# Patient Record
Sex: Female | Born: 1985
Health system: Southern US, Community
[De-identification: ages and names within clinical notes are randomized; demographics above are authoritative.]

## PROBLEM LIST (undated history)

## (undated) DIAGNOSIS — F909 Attention-deficit hyperactivity disorder, unspecified type: Secondary | ICD-10-CM

## (undated) DIAGNOSIS — J45909 Unspecified asthma, uncomplicated: Secondary | ICD-10-CM

## (undated) DIAGNOSIS — N6452 Nipple discharge: Secondary | ICD-10-CM

## (undated) DIAGNOSIS — F988 Other specified behavioral and emotional disorders with onset usually occurring in childhood and adolescence: Secondary | ICD-10-CM

## (undated) DIAGNOSIS — E559 Vitamin D deficiency, unspecified: Secondary | ICD-10-CM

## (undated) HISTORY — DX: Attention-deficit hyperactivity disorder, unspecified type: F90.9

## (undated) HISTORY — PX: CERVICAL BIOPSY  W/ LOOP ELECTRODE EXCISION: SUR135

## (undated) HISTORY — DX: Vitamin D deficiency, unspecified: E55.9

---
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
## 2008-01-07 ENCOUNTER — Ambulatory Visit: Payer: Self-pay | Admitting: Internal Medicine

## 2008-03-25 ENCOUNTER — Ambulatory Visit: Payer: Self-pay

## 2012-11-10 ENCOUNTER — Ambulatory Visit: Payer: Self-pay

## 2012-11-15 ENCOUNTER — Ambulatory Visit (INDEPENDENT_AMBULATORY_CARE_PROVIDER_SITE_OTHER): Payer: PRIVATE HEALTH INSURANCE | Admitting: General Surgery

## 2012-11-15 ENCOUNTER — Encounter: Payer: Self-pay | Admitting: General Surgery

## 2012-11-15 VITALS — BP 120/72 | HR 76 | Resp 14 | Ht 61.0 in | Wt 114.0 lb

## 2012-11-15 DIAGNOSIS — N63 Unspecified lump in unspecified breast: Secondary | ICD-10-CM

## 2012-11-15 DIAGNOSIS — N6452 Nipple discharge: Secondary | ICD-10-CM

## 2012-11-15 DIAGNOSIS — N6459 Other signs and symptoms in breast: Secondary | ICD-10-CM

## 2012-11-15 HISTORY — DX: Nipple discharge: N64.52

## 2012-11-15 NOTE — Patient Instructions (Addendum)
Return in 3-4 months for recheck. Call if any worsening of symptoms or discharge from the breast.

## 2012-11-15 NOTE — Progress Notes (Signed)
Patient ID: Mary Kramer, female   DOB: [birth year], 27 y.o.   MRN: [id number]  Chief Complaint  Patient presents with  . Other    left breast lump with d/c    HPI Mary Kramer is a 27 y.o. female here today for an evaluation of a eft breast lump. Had a physical in May 2014 where the lump was first noticed by the practitioner. Patient noticed a yellowish discharge from Left breast about 2 weeks ago. The discharge occurs with expression of the breast. No reports of breast pain or tenderness. Prolactin level recently done was normal,  TSH done in May was also normal.  HPI  History reviewed. No pertinent past medical history.  History reviewed. No pertinent past surgical history.  Family History  Problem Relation Age of Onset  . Leukemia Maternal Grandmother     Social History History  Substance Use Topics  . Smoking status: Never Smoker   . Smokeless tobacco: Never Used  . Alcohol Use: No    Allergies  Allergen Reactions  . Hydrocodone Rash    Current Outpatient Prescriptions  Medication Sig Dispense Refill  . amphetamine-dextroamphetamine (ADDERALL XR) 15 MG 24 hr capsule Take 15 mg by mouth 2 (two) times daily.      . clonazePAM (KLONOPIN) 1 MG tablet Take 1 tablet by mouth as needed.       No current facility-administered medications for this visit.    Review of Systems Review of Systems  Constitutional: Negative.   Respiratory: Negative.   Cardiovascular: Negative.     Blood pressure 120/72, pulse 76, resp. rate 14, height 5\' 1"  (1.549 m), weight 114 lb (51.71 kg), last menstrual period 11/11/2012.  Physical Exam Physical Exam  Constitutional: She is oriented to person, place, and time. She appears well-developed and well-nourished.  Eyes: Conjunctivae are normal. No scleral icterus.  Neck: Neck supple.  Pulmonary/Chest: Right breast exhibits no inverted nipple, no mass, no nipple discharge, no skin change and no tenderness. Left breast exhibits no inverted  nipple, no mass, no nipple discharge, no skin change and no tenderness.  Lymphadenopathy:    She has no cervical adenopathy.    She has no axillary adenopathy.  Neurological: She is alert and oriented to person, place, and time.  Both breast are noted to be very firm consistent with her age. This is particulary noted in Left breast in the 11-12o'clock location A small firm 1 cm thickening is noted 7:30 o'clock near medial edge of breast.  Data Reviewed Ultrasound done here. No findings  Assessment    No findings of concern   breast mass, nipple discharge  Plan    Recheck in 3-4 months        SANKAR,SEEPLAPUTHUR G 11/15/2012, 9:08 PM

## 2013-03-17 ENCOUNTER — Ambulatory Visit: Payer: PRIVATE HEALTH INSURANCE | Admitting: General Surgery

## 2013-04-14 ENCOUNTER — Ambulatory Visit (INDEPENDENT_AMBULATORY_CARE_PROVIDER_SITE_OTHER): Payer: PRIVATE HEALTH INSURANCE | Admitting: General Surgery

## 2013-04-14 ENCOUNTER — Encounter: Payer: Self-pay | Admitting: General Surgery

## 2013-04-14 ENCOUNTER — Other Ambulatory Visit: Payer: PRIVATE HEALTH INSURANCE

## 2013-04-14 VITALS — BP 96/62 | HR 82 | Resp 12 | Ht 61.0 in | Wt 116.0 lb

## 2013-04-14 DIAGNOSIS — N6019 Diffuse cystic mastopathy of unspecified breast: Secondary | ICD-10-CM

## 2013-04-14 DIAGNOSIS — N6452 Nipple discharge: Secondary | ICD-10-CM

## 2013-04-14 DIAGNOSIS — N63 Unspecified lump in unspecified breast: Secondary | ICD-10-CM

## 2013-04-14 DIAGNOSIS — N6459 Other signs and symptoms in breast: Secondary | ICD-10-CM

## 2013-04-14 NOTE — Patient Instructions (Signed)
Continue self breast exams. Call office for any new breast issues or concerns. 

## 2013-04-14 NOTE — Progress Notes (Signed)
Patient ID: Mary Kramer, female   DOB: 30-Apr-1986, 27 y.o.   MRN: [id number]  Chief Complaint  Patient presents with  . Follow-up    HPI Mary Kramer is a 27 y.o. female.  who presents for her follow up breast evaluation. She had palpable thickening in left breast at 7:30 and 10-11 o'clock locations.  Ultrasound was normal. Patient does perform regular self breast checks. No new breast issues.   HPI  History reviewed. No pertinent past medical history.  Past Surgical History  Procedure Laterality Date  . Leep      Family History  Problem Relation Age of Onset  . Leukemia Maternal Grandmother     Social History History  Substance Use Topics  . Smoking status: Never Smoker   . Smokeless tobacco: Never Used  . Alcohol Use: No    Allergies  Allergen Reactions  . Hydrocodone Rash    Current Outpatient Prescriptions  Medication Sig Dispense Refill  . amphetamine-dextroamphetamine (ADDERALL XR) 15 MG 24 hr capsule Take 15 mg by mouth 2 (two) times daily.      . clonazePAM (KLONOPIN) 1 MG tablet Take 1 tablet by mouth as needed.       No current facility-administered medications for this visit.    Review of Systems Review of Systems  Constitutional: Negative.   Respiratory: Negative.   Cardiovascular: Negative.     Blood pressure 96/62, pulse 82, resp. rate 12, height 5\' 1"  (1.549 m), weight 116 lb (52.617 kg), last menstrual period 03/24/2013.  Physical Exam Physical Exam  Constitutional: She is oriented to person, place, and time. She appears well-developed and well-nourished.  Eyes: Conjunctivae are normal. No scleral icterus.  Neck: Neck supple.  Pulmonary/Chest: Right breast exhibits no inverted nipple, no mass, no nipple discharge, no skin change and no tenderness. Left breast exhibits no inverted nipple, no mass, no nipple discharge, no skin change and no tenderness.  thickening at 10-11 o'clock and a 1 cm thickening at 7: 30 that is unchanged from prior  exam.  Lymphadenopathy:    She has no cervical adenopathy.    She has no axillary adenopathy.  Neurological: She is alert and oriented to person, place, and time.  Skin: Skin is warm and dry.    Data Reviewed US done today at 7.30 and 10-11 ocl left showed no findings.  Assessment    Stable physical exam. She has in the left breast thickening at 10-11 o'clock and a 1 cm thickening at 7: 30 that is unchanged from prior exam.     Plan    Follow up in 8 months with office visit.       Kaleen Rochette G 04/15/2013, 5:51 PM

## 2013-04-15 ENCOUNTER — Encounter: Payer: Self-pay | Admitting: General Surgery

## 2013-12-12 ENCOUNTER — Ambulatory Visit: Payer: Self-pay | Admitting: General Surgery

## 2013-12-28 ENCOUNTER — Telehealth: Payer: Self-pay

## 2013-12-28 NOTE — Telephone Encounter (Signed)
Spoke with patient about follow up breast care. I let her know per Neys[facility]rist[facility] that she no longer qualified for the program. She said that she now has insurance and that she would like to follow up with her GYN doctor from now on instead of following up here. I let her know to call us should she have any further problems.

## 2014-03-27 ENCOUNTER — Encounter: Payer: Self-pay | Admitting: General Surgery

## 2014-07-28 LAB — HM PAP SMEAR

## 2014-12-01 ENCOUNTER — Telehealth: Payer: Self-pay | Admitting: Family Medicine

## 2014-12-01 MED ORDER — AMPHETAMINE-DEXTROAMPHETAMINE 20 MG PO TABS: ORAL_TABLET | ORAL | Status: DC

## 2014-12-01 MED ORDER — AMPHETAMINE-DEXTROAMPHET ER 25 MG PO CP24: 25.0000 mg | ORAL_CAPSULE | Freq: Every day | ORAL | Status: DC

## 2014-12-01 NOTE — Telephone Encounter (Signed)
Pt here to pick up her Adderall meds, which were due June 24th.  Pt has been on vacation so is out of meds.

## 2014-12-01 NOTE — Telephone Encounter (Signed)
Doses need to be corrected; CMA took care of this Needs appt for 12 week f/u

## 2014-12-19 ENCOUNTER — Telehealth: Payer: Self-pay | Admitting: Family Medicine

## 2014-12-19 NOTE — Telephone Encounter (Signed)
Patient's last appointment with me was actually August 25, 2014 (she came in to see Mary Kramer for a BP check, but that doesn't count as a visit with the provider) She will need to be seen for any additional prescriptions of her controlled substances If taking her medicines every day, I anticipate she will run out on August 7th but I believe she skips some days, so she can come in the week of August 8th if we don't have anything this week

## 2015-01-08 ENCOUNTER — Other Ambulatory Visit: Payer: Self-pay | Admitting: Family Medicine

## 2015-01-08 MED ORDER — CLONAZEPAM 1 MG PO TABS: 1.0000 mg | ORAL_TABLET | ORAL | Status: DC | PRN

## 2015-01-08 MED ORDER — AMPHETAMINE-DEXTROAMPHET ER 25 MG PO CP24: 25.0000 mg | ORAL_CAPSULE | Freq: Every day | ORAL | Status: DC

## 2015-01-08 MED ORDER — AMPHETAMINE-DEXTROAMPHETAMINE 20 MG PO TABS: ORAL_TABLET | ORAL | Status: DC

## 2015-02-03 ENCOUNTER — Encounter: Payer: Self-pay | Admitting: Emergency Medicine

## 2015-02-03 ENCOUNTER — Emergency Department
Admission: EM | Admit: 2015-02-03 | Discharge: 2015-02-04 | Disposition: A | Discharge status: Home / Self Care | Payer: 59 | Attending: Student | Admitting: Student

## 2015-02-03 DIAGNOSIS — F10129 Alcohol abuse with intoxication, unspecified: Secondary | ICD-10-CM | POA: Diagnosis present

## 2015-02-03 DIAGNOSIS — R112 Nausea with vomiting, unspecified: Secondary | ICD-10-CM

## 2015-02-03 DIAGNOSIS — F10929 Alcohol use, unspecified with intoxication, unspecified: Secondary | ICD-10-CM

## 2015-02-03 DIAGNOSIS — F10120 Alcohol abuse with intoxication, uncomplicated: Secondary | ICD-10-CM | POA: Insufficient documentation

## 2015-02-03 DIAGNOSIS — Z79899 Other long term (current) drug therapy: Secondary | ICD-10-CM | POA: Insufficient documentation

## 2015-02-03 DIAGNOSIS — F10920 Alcohol use, unspecified with intoxication, uncomplicated: Secondary | ICD-10-CM

## 2015-02-03 LAB — CBC WITH DIFFERENTIAL/PLATELET
BASOS ABS: 0.1 10*3/uL (ref 0–0.1)
Basophils Relative: 1 %
EOS ABS: 0.1 10*3/uL (ref 0–0.7)
EOS PCT: 1 %
HCT: 41.5 % (ref 35.0–47.0)
Hemoglobin: 13.4 g/dL (ref 12.0–16.0)
LYMPHS PCT: 29 %
Lymphs Abs: 4.6 10*3/uL — ABNORMAL HIGH (ref 1.0–3.6)
MCH: 28.7 pg (ref 26.0–34.0)
MCHC: 32.2 g/dL (ref 32.0–36.0)
MCV: 89.1 fL (ref 80.0–100.0)
MONO ABS: 1.7 10*3/uL — AB (ref 0.2–0.9)
Monocytes Relative: 11 %
Neutro Abs: 9.1 10*3/uL — ABNORMAL HIGH (ref 1.4–6.5)
Neutrophils Relative %: 58 %
PLATELETS: 327 10*3/uL (ref 150–440)
RBC: 4.66 MIL/uL (ref 3.80–5.20)
RDW: 13.2 % (ref 11.5–14.5)
WBC: 15.5 10*3/uL — AB (ref 3.6–11.0)

## 2015-02-03 LAB — COMPREHENSIVE METABOLIC PANEL
ALBUMIN: 4.4 g/dL (ref 3.5–5.0)
ALK PHOS: 41 U/L (ref 38–126)
ALT: 17 U/L (ref 14–54)
AST: 25 U/L (ref 15–41)
Anion gap: 10 (ref 5–15)
BUN: 12 mg/dL (ref 6–20)
CALCIUM: 8.9 mg/dL (ref 8.9–10.3)
CO2: 22 mmol/L (ref 22–32)
CREATININE: 0.75 mg/dL (ref 0.44–1.00)
Chloride: 106 mmol/L (ref 101–111)
GFR calc Af Amer: >60 mL/min (ref >60)
GFR calc non Af Amer: >60 mL/min (ref >60)
GLUCOSE: 120 mg/dL — AB (ref 65–99)
Potassium: 3 mmol/L — ABNORMAL LOW (ref 3.5–5.1)
SODIUM: 138 mmol/L (ref 135–145)
Total Bilirubin: 0.5 mg/dL (ref 0.3–1.2)
Total Protein: 7.6 g/dL (ref 6.5–8.1)

## 2015-02-03 LAB — ETHANOL: Alcohol, Ethyl (B): 229 mg/dL — ABNORMAL HIGH (ref <5)

## 2015-02-03 MED ORDER — ONDANSETRON HCL 4 MG/2ML IJ SOLN
INTRAMUSCULAR | Status: AC
  Administered 2015-02-03: 4 mg via INTRAVENOUS
  Filled 2015-02-03: qty 2

## 2015-02-03 MED ORDER — ONDANSETRON HCL 4 MG/2ML IJ SOLN
4.0000 mg | Freq: Once | INTRAMUSCULAR | Status: AC
  Administered 2015-02-03: 4 mg via INTRAVENOUS

## 2015-02-03 MED ORDER — SODIUM CHLORIDE 0.9 % IV BOLUS (SEPSIS)
1000.0000 mL | Freq: Once | INTRAVENOUS | Status: AC
Start: 2015-02-03 — End: 2015-02-04
  Administered 2015-02-04: 1000 mL via INTRAVENOUS

## 2015-02-03 MED ORDER — SODIUM CHLORIDE 0.9 % IV BOLUS (SEPSIS)
1000.0000 mL | Freq: Once | INTRAVENOUS | Status: AC
  Administered 2015-02-03: 1000 mL via INTRAVENOUS

## 2015-02-03 MED ORDER — SODIUM CHLORIDE 0.9 % IV SOLN
Freq: Once | INTRAVENOUS | Status: AC
  Administered 2015-02-03: 22:00:00 via INTRAVENOUS

## 2015-02-03 NOTE — ED Provider Notes (Signed)
[facility] Emergency Department Provider Note  ____________________________________________  Time seen: Seen upon arrival to the treatment room.  I have reviewed the triage vital signs and the nursing notes.   HISTORY  Chief Complaint Alcohol Intoxication    HPI Mary Kramer is a 29 y.o. female with a history of ADHD who is presenting today for alcohol intoxication. The patient is unable to give a complete history because of her intoxicated state. However, her husband says that they were at a gathering after a funeral and the patient was drinking alcohol. It is unclear how much alcohol she had but the husband says that he feels that the patient has had at least several "shots."  He says that she usually does not drink and that is why he thinks that she has particularly affected by alcohol tonight. Prior to arrival the patient vomited once and then vomited just before I entered the room. There was no blood in the vomit. The patient denies any pain. There was no trauma prior to arrival.   No past medical history on file.  Patient Active Problem List   Diagnosis Date Noted  . Lump or mass in breast 11/15/2012  . Discharge of breast 11/15/2012    Past Surgical History  Procedure Laterality Date  . Leep      Current Outpatient Rx  Name  Route  Sig  Dispense  Refill  . amphetamine-dextroamphetamine (ADDERALL XR) 25 MG 24 hr capsule   Oral   Take 1 capsule by mouth daily.   30 capsule   0   . amphetamine-dextroamphetamine (ADDERALL) 20 MG tablet      Take 1 early afternoon and take 1 late afternoon; separate pills by at last 4 hours   60 tablet   0   . clonazePAM (KLONOPIN) 1 MG tablet   Oral   Take 1 tablet (1 mg total) by mouth as needed.   30 tablet   0     Allergies Hydrocodone  Family History  Problem Relation Age of Onset  . Leukemia Maternal Grandmother     Social History Social History  Substance Use Topics  . Smoking  status: Never Smoker   . Smokeless tobacco: Never Used  . Alcohol Use: Yes    Review of Systems Constitutional: No fever/chills Eyes: No visual changes. ENT: No sore throat. Cardiovascular: Denies chest pain. Respiratory: Denies shortness of breath. Gastrointestinal: No abdominal pain.  No nausea, no vomiting.  No diarrhea.  No constipation. Genitourinary: Negative for dysuria. Musculoskeletal: Negative for back pain. Skin: Negative for rash. Neurological: Negative for headaches, focal weakness or numbness.  10-point ROS otherwise negative.  ____________________________________________   PHYSICAL EXAM:  VITAL SIGNS: ED Triage Vitals  Enc Vitals Group     BP 02/03/15 2201 113/97 mmHg     Pulse Rate 02/03/15 2201 120     Resp 02/03/15 2201 12     Temp --      Temp Source 02/03/15 2203 Axillary     SpO2 02/03/15 2201 100 %     Weight 02/03/15 2201 120 lb (54.432 kg)     Height 02/03/15 2201 5' (1.524 m)     Head Cir --      Peak Flow --      Pain Score --      Pain Loc --      Pain Edu? --      Excl. in GC? --     Constitutional: Patient is somnolent but arousable.  Once woken the patient is a GCS of 14 secondary to confusion most likely from her intoxication. She has vomitus on her clothing. Eyes: Conjunctivae are normal. PERRL. EOMI. Head: Atraumatic. Nose: No congestion/rhinnorhea. Mouth/Throat: Mucous membranes are moist.  Oropharynx non-erythematous. Neck: No stridor.   Cardiovascular: Normal rate, regular rhythm. Grossly normal heart sounds.  Good peripheral circulation. Respiratory: Normal respiratory effort.  No retractions. Lungs CTAB. Gastrointestinal: Soft and nontender. No distention. No abdominal bruits. No CVA tenderness. Musculoskeletal: No lower extremity tenderness nor edema.  No joint effusions. Neurologic:  Slurring of her speech. No gross focal neurologic deficits are appreciated. Skin:  Skin is warm, dry and intact. No rash  noted.   ____________________________________________   LABS (all labs ordered are listed, but only abnormal results are displayed)  Labs Reviewed  CBC WITH DIFFERENTIAL/PLATELET - Abnormal; Notable for the following:    WBC 15.5 (*)    Neutro Abs 9.1 (*)    Lymphs Abs 4.6 (*)    Monocytes Absolute 1.7 (*)    All other components within normal limits  COMPREHENSIVE METABOLIC PANEL  ETHANOL   ____________________________________________  EKG   ____________________________________________  RADIOLOGY   ____________________________________________   PROCEDURES    ____________________________________________   INITIAL IMPRESSION / ASSESSMENT AND PLAN / ED COURSE  Pertinent labs & imaging results that were available during my care of the patient were reviewed by me and considered in my medical decision making (see chart for details).  ----------------------------------------- 11:52 PM on 02/03/2015 -----------------------------------------  Patient being redosed Zofran. No further vomiting however is complaining of nausea. Will need more time to sober. Signed out to Dr. Inocencio Homes. ____________________________________________   FINAL CLINICAL IMPRESSION(S) / ED DIAGNOSES  Acute alcohol intoxication. Initial visit.    Myrna Blazer, MD 02/03/15 [phone]

## 2015-02-03 NOTE — ED Notes (Signed)
Patient and husband were at a party for a friend. Per patient's husband, patient had a few drinks and she does not usually drink. Husband states they were on their way home and patient began to throw up. Husband could not get patient to reply after vomiting so he came here immediately.

## 2015-02-03 NOTE — ED Notes (Addendum)
Pt with large amount of emesis. md to bedside.  h ead of bed at 60 degrees, aide at bedside assisting pt with emesis bag.

## 2015-02-03 NOTE — ED Notes (Signed)
Husband had to go outside will return shortly Mellody Dance) [phone]

## 2015-02-03 NOTE — ED Notes (Addendum)
Pt's spouse states pt has been consuming etoh this pm. Pt arousable to sternal rub then immediately unable to hold head up. Pt with large amount of emesis while this rn assisting pt out of vehicle. Skin normal color warm and dry.

## 2015-02-04 DIAGNOSIS — F10929 Alcohol use, unspecified with intoxication, unspecified: Secondary | ICD-10-CM

## 2015-02-04 MED ORDER — POTASSIUM CHLORIDE CRYS ER 20 MEQ PO TBCR
20.0000 meq | EXTENDED_RELEASE_TABLET | Freq: Once | ORAL | Status: AC
  Administered 2015-02-04: 20 meq via ORAL
  Filled 2015-02-04: qty 1

## 2015-02-04 NOTE — ED Provider Notes (Addendum)
-----------------------------------------   2:52 AM on 02/04/2015 -----------------------------------------  Care was assumed from Dr. Langston Masker at 11:52 PM. Briefly, this is a healthy 29 year old female who presented with alcohol intoxication and vomiting. Currently she appears clearly sober, she is ambulatory, sitting up in bed, talkative in no acute distress, tolerating by mouth intake. Blood pressure at the time of discharge is 91/58. She has received IV fluids however I suspect that this is her usual blood pressure as she is a young, healthy, thin person.  Her significant other will take her home. DC with return precautions. Labs are notable for mild hypokalemia, we'll replete potassium.   Gayla Doss, MD 02/04/15 1324  Gayla Doss, MD 02/04/15 [phone]

## 2015-02-04 NOTE — ED Notes (Signed)
Pt. Going home with husband. 

## 2015-02-05 ENCOUNTER — Ambulatory Visit: Payer: Self-pay | Admitting: Family Medicine

## 2015-02-09 ENCOUNTER — Encounter: Payer: Self-pay | Admitting: Family Medicine

## 2015-02-09 ENCOUNTER — Ambulatory Visit (INDEPENDENT_AMBULATORY_CARE_PROVIDER_SITE_OTHER): Payer: Managed Care, Other (non HMO) | Admitting: Family Medicine

## 2015-02-09 VITALS — BP 127/85 | HR 87 | Temp 98.7°F | Ht 61.1 in | Wt 115.4 lb

## 2015-02-09 DIAGNOSIS — F909 Attention-deficit hyperactivity disorder, unspecified type: Secondary | ICD-10-CM | POA: Insufficient documentation

## 2015-02-09 DIAGNOSIS — F10120 Alcohol abuse with intoxication, uncomplicated: Secondary | ICD-10-CM | POA: Diagnosis not present

## 2015-02-09 DIAGNOSIS — F419 Anxiety disorder, unspecified: Secondary | ICD-10-CM | POA: Diagnosis not present

## 2015-02-09 DIAGNOSIS — E162 Hypoglycemia, unspecified: Secondary | ICD-10-CM | POA: Diagnosis not present

## 2015-02-09 DIAGNOSIS — F10920 Alcohol use, unspecified with intoxication, uncomplicated: Secondary | ICD-10-CM

## 2015-02-09 MED ORDER — AMPHETAMINE-DEXTROAMPHETAMINE 20 MG PO TABS: ORAL_TABLET | ORAL | Status: DC

## 2015-02-09 MED ORDER — AMPHETAMINE-DEXTROAMPHET ER 25 MG PO CP24: 25.0000 mg | ORAL_CAPSULE | Freq: Every day | ORAL | Status: DC

## 2015-02-09 NOTE — Progress Notes (Signed)
BP 127/85 mmHg  Pulse 87  Temp(Src) 98.7 F (37.1 C)  Ht 5' 1.1" (1.552 m)  Wt 115 lb 6.4 oz (52.345 kg)  BMI 21.73 kg/m2  SpO2 98%  LMP 02/03/2015   Subjective:    Patient ID: Mary Kramer, female    DOB: Feb 16, 1986, 29 y.o.   MRN: [id number]  HPI: Mary Kramer is a 29 y.o. female  Chief Complaint  Patient presents with  . ADHD    pt states she needs refills on Adderal's   She was in the ER for alcohol intoxication She had five shots of liquor within a two hour period; felt sick and spinning; tried to make herself vomit and couldn't; husband helped her in the truck and then doesn't remember much more; does not usually drink, special social occasion; hit her hard because she just doesn't drink usually; might have two beers or a glass of wine at a social event; husband took her to the hospital; vomited in the truck on the way and she has no recall; not awake throwing up, not like a drunk sick thing; the last dose of Klonopin was two days prior to the alcohol The next day, she didn't really have a hangover, just very weak and tired and nauseaous; pins and needles in her stomach from vomiting; home on Monday from work; better on Tuesday; by Thursday, she was better but tired and weak Her alcohol level was 229  She has ADHD; doing well on that medicine; no headaches, no loss of appetite, or feeling jittery; the medicine helps her to concentrate, focus on one thing rather than working on 3-4 things and not getting anything done; has taken medicine for a while; no energy drinks; nonsmoker; no use of cocaine  Sugar has gone up and down for a while; everything has been fine for a while; a little worse since Saturday; feels bad after eating sugary stuff  Relevant past medical, surgical, family and social history reviewed and updated as indicated. Interim medical history since our last visit reviewed. Allergies and medications reviewed and updated.  Review of Systems  Per  HPI unless specifically indicated above     Objective:    BP 127/85 mmHg  Pulse 87  Temp(Src) 98.7 F (37.1 C)  Ht 5' 1.1" (1.552 m)  Wt 115 lb 6.4 oz (52.345 kg)  BMI 21.73 kg/m2  SpO2 98%  LMP 02/03/2015  Wt Readings from Last 3 Encounters:  02/09/15 115 lb 6.4 oz (52.345 kg)  10/19/14 121 lb (54.885 kg)  02/03/15 120 lb (54.432 kg)    Physical Exam  Constitutional: She appears well-developed and well-nourished. No distress.  Eyes: EOM are normal. No scleral icterus.  Neck: No thyromegaly present.  Cardiovascular: Normal rate and regular rhythm.   Pulmonary/Chest: Effort normal and breath sounds normal.  Abdominal: She exhibits no distension.  Skin: No pallor.  Psychiatric: She has a normal mood and affect. Her behavior is normal. Judgment and thought content normal.   Results for orders placed or performed in visit on 02/09/15  HM PAP SMEAR  Result Value Ref Range   HM Pap smear from PP       Assessment & Plan:   Problem List Items Addressed This Visit      Endocrine   Hypoglycemia    She describes what sound like episodes of reactive hypoglyemia, or hypoglycemia due to insufficient intake; advised her to carry a pack crackers with peanut butter or other similar snack; will check  glucose tolerance test      Relevant Orders   Glucose tolerance, 3 hours     Other   Alcohol intoxication    Single episode after period of very little alcohol; significant quantity in a short amount of time; so glad that her husband took her to the ER; discussed the seriousness of that episode; do NOT take any benzos any more; discussed safe limits of alcohol, that women in general cannot tolerate as much alcohol as men and have lower threshold limits; discussed respiratory depression, risk of unintentional overdose, toxicity      Acute anxiety    While I do not have a problem prescribing stimulants for her with her ADHD, I do have a problem with prescribing benzodiazepines for her; I  explained that I will not prescribe benzos any longer, and explained the risk of combination of benzos with alcohol, which could result in unintentional fatal overdose      ADHD (attention deficit hyperactivity disorder) - Primary    I have no concern about the use of ADHD medicines in this women who suffered an episode of alcohol intoxication; if anything, some women will self-medicate with alcohol to treat underlying ADHD, but this does not seem to be the case for her; refills provided of her ADHD stimulant; return in 3 months for follow-up, refills         Follow up plan: Return in about 3 months (around 05/11/2015).  An after-visit summary was printed and given to the patient at check-out.  Please see the patient instructions which may contain other information and recommendations beyond what is mentioned above in the assessment and plan. Meds ordered this encounter  Medications  . DISCONTD: amphetamine-dextroamphetamine (ADDERALL) 20 MG tablet    Sig: Take 1 early afternoon and take 1 late afternoon; separate pills by at last 4 hours    Dispense:  60 tablet    Refill:  0  . DISCONTD: amphetamine-dextroamphetamine (ADDERALL XR) 25 MG 24 hr capsule    Sig: Take 1 capsule by mouth daily.    Dispense:  30 capsule    Refill:  0  . DISCONTD: amphetamine-dextroamphetamine (ADDERALL) 20 MG tablet    Sig: Take 1 early afternoon and take 1 late afternoon; separate pills by at last 4 hours    Dispense:  60 tablet    Refill:  0  . DISCONTD: amphetamine-dextroamphetamine (ADDERALL XR) 25 MG 24 hr capsule    Sig: Take 1 capsule by mouth daily.    Dispense:  30 capsule    Refill:  0  . amphetamine-dextroamphetamine (ADDERALL XR) 25 MG 24 hr capsule    Sig: Take 1 capsule by mouth daily.    Dispense:  30 capsule    Refill:  0    Fill on or after March 11, 2015  . amphetamine-dextroamphetamine (ADDERALL) 20 MG tablet    Sig: Take 1 early afternoon and take 1 late afternoon; separate pills  by at last 4 hours    Dispense:  60 tablet    Refill:  0    Fill on or after March 11, 2015   Medications Discontinued During This Encounter  Medication Reason  . clonazePAM (KLONOPIN) 1 MG tablet Discontinued by provider  . amphetamine-dextroamphetamine (ADDERALL) 20 MG tablet Reorder  . amphetamine-dextroamphetamine (ADDERALL XR) 25 MG 24 hr capsule Reorder  . amphetamine-dextroamphetamine (ADDERALL) 20 MG tablet Reorder  . amphetamine-dextroamphetamine (ADDERALL XR) 25 MG 24 hr capsule Reorder  . amphetamine-dextroamphetamine (ADDERALL XR) 25 MG 24 hr  capsule Reorder  . amphetamine-dextroamphetamine (ADDERALL) 20 MG tablet Reorder

## 2015-02-09 NOTE — Patient Instructions (Addendum)
Please call if you have not heard back about the glucose tolerance test by Wednesday Return in 3 months for next ADHD check and medicine refills Stay smart Carry crackers or something similar to use if you need calories Hypoglycemia Hypoglycemia occurs when the glucose in your blood is too low. Glucose is a type of sugar that is your body's main energy source. Hormones, such as insulin and glucagon, control the level of glucose in the blood. Insulin lowers blood glucose and glucagon increases blood glucose. Having too much insulin in your blood stream, or not eating enough food containing sugar, can result in hypoglycemia. Hypoglycemia can happen to people with or without diabetes. It can develop quickly and can be a medical emergency.  CAUSES   Missing or delaying meals.  Not eating enough carbohydrates at meals.  Taking too much diabetes medicine.  Not timing your oral diabetes medicine or insulin doses with meals, snacks, and exercise.  Nausea and vomiting.  Certain medicines.  Severe illnesses, such as hepatitis, kidney disorders, and certain eating disorders.  Increased activity or exercise without eating something extra or adjusting medicines.  Drinking too much alcohol.  A nerve disorder that affects body functions like your heart rate, blood pressure, and digestion (autonomic neuropathy).  A condition where the stomach muscles do not function properly (gastroparesis). Therefore, medicines and food may not absorb properly.  Rarely, a tumor of the pancreas can produce too much insulin. SYMPTOMS   Hunger.  Sweating (diaphoresis).  Change in body temperature.  Shakiness.  Headache.  Anxiety.  Lightheadedness.  Irritability.  Difficulty concentrating.  Dry mouth.  Tingling or numbness in the hands or feet.  Restless sleep or sleep disturbances.  Altered speech and coordination.  Change in mental status.  Seizures or prolonged  convulsions.  Combativeness.  Drowsiness (lethargic).  Weakness.  Increased heart rate or palpitations.  Confusion.  Pale, gray skin color.  Blurred or double vision.  Fainting. DIAGNOSIS  A physical exam and medical history will be performed. Your caregiver may make a diagnosis based on your symptoms. Blood tests and other lab tests may be performed to confirm a diagnosis. Once the diagnosis is made, your caregiver will see if your signs and symptoms go away once your blood glucose is raised.  TREATMENT  Usually, you can easily treat your hypoglycemia when you notice symptoms.  Check your blood glucose. If it is less than 70 mg/dl, take one of the following:   3-4 glucose tablets.    cup juice.    cup regular soda.   1 cup skim milk.   -1 tube of glucose gel.   5-6 hard candies.   Avoid high-fat drinks or food that may delay a rise in blood glucose levels.  Do not take more than the recommended amount of sugary foods, drinks, gel, or tablets. Doing so will cause your blood glucose to go too high.   Wait 10-15 minutes and recheck your blood glucose. If it is still less than 70 mg/dl or below your target range, repeat treatment.   Eat a snack if it is more than 1 hour until your next meal.  There may be a time when your blood glucose may go so low that you are unable to treat yourself at home when you start to notice symptoms. You may need someone to help you. You may even faint or be unable to swallow. If you cannot treat yourself, someone will need to bring you to the hospital.  HOME CARE  INSTRUCTIONS  If you have diabetes, follow your diabetes management plan by:  Taking your medicines as directed.  Following your exercise plan.  Following your meal plan. Do not skip meals. Eat on time.  Testing your blood glucose regularly. Check your blood glucose before and after exercise. If you exercise longer or different than usual, be sure to check blood  glucose more frequently.  Wearing your medical alert jewelry that says you have diabetes.  Identify the cause of your hypoglycemia. Then, develop ways to prevent the recurrence of hypoglycemia.  Do not take a hot bath or shower right after an insulin shot.  Always carry treatment with you. Glucose tablets are the easiest to carry.  If you are going to drink alcohol, drink it only with meals.  Tell friends or family members ways to keep you safe during a seizure. This may include removing hard or sharp objects from the area or turning you on your side.  Maintain a healthy weight. SEEK MEDICAL CARE IF:   You are having problems keeping your blood glucose in your target range.  You are having frequent episodes of hypoglycemia.  You feel you might be having side effects from your medicines.  You are not sure why your blood glucose is dropping so low.  You notice a change in vision or a new problem with your vision. SEEK IMMEDIATE MEDICAL CARE IF:   Confusion develops.  A change in mental status occurs.  The inability to swallow develops.  Fainting occurs. Document Released: 05/12/2005 Document Revised: 05/17/2013 Document Reviewed: 09/08/2011 [facility] Patient Information 2015 [place], [place]. This information is not intended to replace advice given to you by your health care provider. Make sure you discuss any questions you have with your health care provider.

## 2015-02-11 NOTE — Assessment & Plan Note (Signed)
She describes what sound like episodes of reactive hypoglyemia, or hypoglycemia due to insufficient intake; advised her to carry a pack crackers with peanut butter or other similar snack; will check glucose tolerance test

## 2015-02-11 NOTE — Assessment & Plan Note (Signed)
Single episode after period of very little alcohol; significant quantity in a short amount of time; so glad that her husband took her to the ER; discussed the seriousness of that episode; do NOT take any benzos any more; discussed safe limits of alcohol, that women in general cannot tolerate as much alcohol as men and have lower threshold limits; discussed respiratory depression, risk of unintentional overdose, toxicity

## 2015-02-11 NOTE — Assessment & Plan Note (Signed)
While I do not have a problem prescribing stimulants for her with her ADHD, I do have a problem with prescribing benzodiazepines for her; I explained that I will not prescribe benzos any longer, and explained the risk of combination of benzos with alcohol, which could result in unintentional fatal overdose

## 2015-02-11 NOTE — Assessment & Plan Note (Signed)
I have no concern about the use of ADHD medicines in this women who suffered an episode of alcohol intoxication; if anything, some women will self-medicate with alcohol to treat underlying ADHD, but this does not seem to be the case for her; refills provided of her ADHD stimulant; return in 3 months for follow-up, refills

## 2015-05-04 ENCOUNTER — Telehealth: Payer: Self-pay | Admitting: Family Medicine

## 2015-05-04 NOTE — Telephone Encounter (Signed)
No answer, no voicemail.

## 2015-05-04 NOTE — Telephone Encounter (Signed)
Patient is coming back for 3 month f/u soon We still don't have her 3 hour glucose tolerance test I'd really like that done before her appointment to go over with her Thanks

## 2015-05-07 NOTE — Telephone Encounter (Signed)
No answer, no voicemail.

## 2015-05-08 NOTE — Telephone Encounter (Signed)
No answer, no voicemail.

## 2015-05-10 NOTE — Telephone Encounter (Signed)
No answer, no voicemail.

## 2015-05-11 ENCOUNTER — Encounter: Payer: Self-pay | Admitting: Family Medicine

## 2015-05-11 ENCOUNTER — Ambulatory Visit (INDEPENDENT_AMBULATORY_CARE_PROVIDER_SITE_OTHER): Payer: Managed Care, Other (non HMO) | Admitting: Family Medicine

## 2015-05-11 VITALS — BP 112/76 | HR 96 | Temp 98.0°F | Wt 119.0 lb

## 2015-05-11 DIAGNOSIS — F909 Attention-deficit hyperactivity disorder, unspecified type: Secondary | ICD-10-CM | POA: Diagnosis not present

## 2015-05-11 DIAGNOSIS — G729 Myopathy, unspecified: Secondary | ICD-10-CM

## 2015-05-11 DIAGNOSIS — G43009 Migraine without aura, not intractable, without status migrainosus: Secondary | ICD-10-CM | POA: Diagnosis not present

## 2015-05-11 DIAGNOSIS — M6289 Other specified disorders of muscle: Secondary | ICD-10-CM

## 2015-05-11 MED ORDER — AMPHETAMINE-DEXTROAMPHET ER 25 MG PO CP24: 25.0000 mg | ORAL_CAPSULE | Freq: Every day | ORAL | Status: DC

## 2015-05-11 MED ORDER — AMPHETAMINE-DEXTROAMPHETAMINE 20 MG PO TABS: ORAL_TABLET | ORAL | Status: DC

## 2015-05-11 NOTE — Patient Instructions (Addendum)
Please do let us know if we need your next prescription to be a different medicine Try the stretches Return in 3 months

## 2015-05-11 NOTE — Progress Notes (Signed)
BP 112/76 mmHg  Pulse 96  Temp(Src) 98 F (36.7 C)  Wt 119 lb (53.978 kg)  SpO2 99%  LMP 04/26/2015 (Approximate)   Subjective:    Patient ID: Mary Kramer, female    DOB: 05-19-1986, 29 y.o.   MRN: [id number]  HPI: Mary Kramer is a 29 y.o. female  Chief Complaint  Patient presents with  . ADHD    follow up, med refills   She says that the insurance isn't going to cover her medicine any more, $100 or so last month She will call and see what else is covered She has been taking 25 mg XR in the morning 8:30 am; lasts about 6 hours and then takes the short-acting dose between 2;30 and 3:00 pm Having some headaches on the XR, maybe 3 times a week; migraines, has had them before; migraines are withOUT aura Triggered by caffeine, certain wipes at work No loss of appetite; no tremors or shaking Medicine is helping her to concentrate; does not feel like a zombie Mood is good  She just hasn't had the sugar test done; keeping herself from getting too hungry; willing to cancel it right now  She gets tense in the upper shoulders; she doesn't want to take medicine; her legs are sore a lot; sore along the   Relevant past medical, surgical, family and social history reviewed and updated as indicated. Interim medical history since our last visit reviewed.  Allergies and medications reviewed and updated.  Review of Systems Per HPI unless specifically indicated above     Objective:    BP 112/76 mmHg  Pulse 96  Temp(Src) 98 F (36.7 C)  Wt 119 lb (53.978 kg)  SpO2 99%  LMP 04/26/2015 (Approximate)  Wt Readings from Last 3 Encounters:  05/11/15 119 lb (53.978 kg)  02/09/15 115 lb 6.4 oz (52.345 kg)  10/19/14 121 lb (54.885 kg)    Physical Exam  Constitutional: She appears well-developed and well-nourished. No distress.  Cardiovascular: Normal rate and regular rhythm.   Pulmonary/Chest: Effort normal and breath sounds normal.  Musculoskeletal:       Right elbow:  No tenderness found.       Left elbow: No tenderness found.  Mild tightness of both trapezius muscles proximally; no tenderness under the clavicles  Neurological: She is alert. She displays no tremor.  Skin: No rash noted.  Psychiatric: She has a normal mood and affect. Her mood appears not anxious. Her speech is not rapid and/or pressured. She is not agitated and not hyperactive. She does not exhibit a depressed mood.   Results for orders placed or performed in visit on 02/09/15  HM PAP SMEAR  Result Value Ref Range   HM Pap smear from PP       Assessment & Plan:   Problem List Items Addressed This Visit      Cardiovascular and Mediastinum   Migraine without aura - Primary (Chronic)    Satisfied with current treatment; avoid / limit triggers        Other   ADHD (attention deficit hyperactivity disorder) (Chronic)    Doing well with current therapy, but unfortunately there has been a change in her insurance coverage apparently; we discussed options; she will call her insurance company and find out what medicine(s) will be covered that will be less expensive for her; I would prefer to keep her on a long-acting to be taken in the morning with breakthrough afternoon med if needed; after next Rx, she will  check pulse and BP and weight and call if any variations; willing to see her back in 3 months if med working well, sooner if adjustments are needed       Other Visit Diagnoses    Muscle tightness        I don't think this is fibromyalgia; negative pressure points under clavicle and elbows; try gentle stretching, check work ergonomics       Follow up plan: Return in about 3 months (around 08/09/2015).  Meds ordered this encounter  Medications  . amphetamine-dextroamphetamine (ADDERALL) 20 MG tablet    Sig: Take 1 early afternoon and take 1 late afternoon; separate pills by at last 4 hours    Dispense:  180 tablet    Refill:  0  . amphetamine-dextroamphetamine (ADDERALL XR) 25 MG  24 hr capsule    Sig: Take 1 capsule by mouth daily.    Dispense:  90 capsule    Refill:  0   An after-visit summary was printed and given to the patient at check-out.  Please see the patient instructions which may contain other information and recommendations beyond what is mentioned above in the assessment and plan.

## 2015-05-11 NOTE — Assessment & Plan Note (Signed)
Satisfied with current treatment; avoid / limit triggers

## 2015-05-11 NOTE — Telephone Encounter (Signed)
Patient came in for appt. She has decided she does not want to do the test.

## 2015-05-12 NOTE — Assessment & Plan Note (Signed)
Doing well with current therapy, but unfortunately there has been a change in her insurance coverage apparently; we discussed options; she will call her insurance company and find out what medicine(s) will be covered that will be less expensive for her; I would prefer to keep her on a long-acting to be taken in the morning with breakthrough afternoon med if needed; after next Rx, she will check pulse and BP and weight and call if any variations; willing to see her back in 3 months if med working well, sooner if adjustments are needed

## 2015-05-23 ENCOUNTER — Ambulatory Visit
Admission: EM | Admit: 2015-05-23 | Discharge: 2015-05-23 | Disposition: A | Discharge status: Home / Self Care | Payer: Worker's Compensation | Attending: Family Medicine | Admitting: Family Medicine

## 2015-05-23 DIAGNOSIS — T304 Corrosion of unspecified body region, unspecified degree: Secondary | ICD-10-CM

## 2015-05-23 HISTORY — DX: Other specified behavioral and emotional disorders with onset usually occurring in childhood and adolescence: F98.8

## 2015-05-23 MED ORDER — TETANUS-DIPHTH-ACELL PERTUSSIS 5-2.5-18.5 LF-MCG/0.5 IM SUSP
0.5000 mL | Freq: Once | INTRAMUSCULAR | Status: AC
  Administered 2015-05-23: 0.5 mL via INTRAMUSCULAR

## 2015-05-23 NOTE — ED Notes (Signed)
Work in a [employer] and using Liquid Nitrogen and canister exploded while holding onto it. Burn to right palm and thumb

## 2015-05-23 NOTE — ED Provider Notes (Signed)
CSN: [id number]     Arrival date & time 05/23/15  [id number] History   First MD Initiated Contact with Patient 05/23/15 1030     Chief Complaint  Patient presents with  . Hand Burn   (Consider location/radiation/quality/duration/timing/severity/associated sxs/prior Treatment) HPI: Patient presents today with symptoms of tenderness to the right palmar aspect of the hand and thumb. Patient states that she was assisting with wart removal with nitric oxide when the chemical spouted out and hit her right hand. She denies any injury anywhere else. She immediately put Silvadene cream on the area. She denies any blisters of the area.  Past Medical History  Diagnosis Date  . ADHD (attention deficit hyperactivity disorder)   . Vitamin D deficiency   . ADD (attention deficit disorder)    Past Surgical History  Procedure Laterality Date  . Leep    . Leep     Family History  Problem Relation Age of Onset  . Leukemia Maternal Grandmother   . Anxiety disorder Mother   . Migraines Mother   . Hyperlipidemia Father   . Transient ischemic attack Father   . Hypertension Father   . Stroke Father   . Lung disease Father   . Cancer Paternal Grandmother     leukemia  . Diabetes Paternal Grandfather    Social History  Substance Use Topics  . Smoking status: Never Smoker   . Smokeless tobacco: Never Used  . Alcohol Use: Yes     Comment: rarely   OB History    Gravida Para Term Preterm AB TAB SAB Ectopic Multiple Living   1 0   1  0 0  0      Obstetric Comments   1st Menstrual Cycle:  13 1st Pregnancy:  23     Review of Systems: Negative except mentioned above.   Allergies  Lexapro; Hydrocodone; and Vicodin  Home Medications   Prior to Admission medications   Medication Sig Start Date End Date Taking? Authorizing Provider  amphetamine-dextroamphetamine (ADDERALL XR) 25 MG 24 hr capsule Take 1 capsule by mouth daily. 05/11/15  Yes Kerman PasseyMelinda P Lada, MD  amphetamine-dextroamphetamine  (ADDERALL) 20 MG tablet Take 1 early afternoon and take 1 late afternoon; separate pills by at last 4 hours 05/11/15  Yes Kerman PasseyMelinda P Lada, MD  norgestimate-ethinyl estradiol (ORTHO-CYCLEN,SPRINTEC,PREVIFEM) 0.25-35 MG-MCG tablet Take 1 tablet by mouth daily.    Historical Provider, MD   Meds Ordered and Administered this Visit   Medications  Tdap (BOOSTRIX) injection 0.5 mL (not administered)    BP 111/77 mmHg  Pulse 70  Temp(Src) 97.1 F (36.2 C) (Tympanic)  Resp 16  Ht 5\' 1"  (1.549 m)  Wt 119 lb (53.978 kg)  BMI 22.50 kg/m2  SpO2 100%  LMP 05/21/2015 (LMP Unknown) No data found.   Physical Exam   GENERAL: NAD RESP: CTA B CARD: RRR SKIN: R Hand-no blisters noted, no ecchymosis or discoloration of the skin appreciated, patient had been soaking hand in room temperature saline when I came in to examine her, she stated that her thumb and other digits felt normal just mild/moderate discomfort in the palm of the right hand remained, no swelling of the hand appreciated, digits nv intact, FROM of digits/wrist  NEURO: CN II-XII grossly intact   ED Course  Procedures (including critical care time)   MDM   1. Chemical burn    I called poison control to discuss the incident. They suggested that I treat the patient with supportive care. I encouraged patient  to seek medical attention if her symptoms did persist or worsen. She will take Tylenol or Motrin for pain if needed. She should be able to return to work. Forms were filled out regarding this. TDAP was given to patient prior to discharge.    Jolene Provost, MD 05/23/15 [phone]

## 2015-08-10 ENCOUNTER — Encounter: Payer: Self-pay | Admitting: Family Medicine

## 2015-08-10 ENCOUNTER — Ambulatory Visit (INDEPENDENT_AMBULATORY_CARE_PROVIDER_SITE_OTHER): Payer: Managed Care, Other (non HMO) | Admitting: Family Medicine

## 2015-08-10 VITALS — BP 121/78 | HR 94 | Temp 98.7°F | Wt 121.0 lb

## 2015-08-10 DIAGNOSIS — G4700 Insomnia, unspecified: Secondary | ICD-10-CM | POA: Diagnosis not present

## 2015-08-10 DIAGNOSIS — E559 Vitamin D deficiency, unspecified: Secondary | ICD-10-CM | POA: Insufficient documentation

## 2015-08-10 DIAGNOSIS — F909 Attention-deficit hyperactivity disorder, unspecified type: Secondary | ICD-10-CM

## 2015-08-10 MED ORDER — AMPHETAMINE-DEXTROAMPHETAMINE 20 MG PO TABS: ORAL_TABLET | ORAL | Status: DC

## 2015-08-10 MED ORDER — AMPHETAMINE-DEXTROAMPHET ER 25 MG PO CP24: 25.0000 mg | ORAL_CAPSULE | Freq: Every day | ORAL | Status: DC

## 2015-08-10 NOTE — Patient Instructions (Addendum)
Take 2,000 iu of vitamin D3 once a day for about one month, then 1,000 iu daily is usually sufficient Try 3 mg of melatonin at the exact same time of night for 3 weeks to see if that resets your clock Do try to go to bed at the same time of night every night Return in 3 months  Insomnia Insomnia is a sleep disorder that makes it difficult to fall asleep or to stay asleep. Insomnia can cause tiredness (fatigue), low energy, difficulty concentrating, mood swings, and poor performance at work or school.  There are three different ways to classify insomnia:  Difficulty falling asleep.  Difficulty staying asleep.  Waking up too early in the morning. Any type of insomnia can be long-term (chronic) or short-term (acute). Both are common. Short-term insomnia usually lasts for three months or less. Chronic insomnia occurs at least three times a week for longer than three months. CAUSES  Insomnia may be caused by another condition, situation, or substance, such as:  Anxiety.  Certain medicines.  Gastroesophageal reflux disease (GERD) or other gastrointestinal conditions.  Asthma or other breathing conditions.  Restless legs syndrome, sleep apnea, or other sleep disorders.  Chronic pain.  Menopause. This may include hot flashes.  Stroke.  Abuse of alcohol, tobacco, or illegal drugs.  Depression.  Caffeine.   Neurological disorders, such as Alzheimer disease.  An overactive thyroid (hyperthyroidism). The cause of insomnia may not be known. RISK FACTORS Risk factors for insomnia include:  Gender. Women are more commonly affected than men.  Age. Insomnia is more common as you get older.  Stress. This may involve your professional or personal life.  Income. Insomnia is more common in people with lower income.  Lack of exercise.   Irregular work schedule or night shifts.  Traveling between different time zones. SIGNS AND SYMPTOMS If you have insomnia, trouble falling  asleep or trouble staying asleep is the main symptom. This may lead to other symptoms, such as:  Feeling fatigued.  Feeling nervous about going to sleep.  Not feeling rested in the morning.  Having trouble concentrating.  Feeling irritable, anxious, or depressed. TREATMENT  Treatment for insomnia depends on the cause. If your insomnia is caused by an underlying condition, treatment will focus on addressing the condition. Treatment may also include:   Medicines to help you sleep.  Counseling or therapy.  Lifestyle adjustments. HOME CARE INSTRUCTIONS   Take medicines only as directed by your health care provider.  Keep regular sleeping and waking hours. Avoid naps.  Keep a sleep diary to help you and your health care provider figure out what could be causing your insomnia. Include:   When you sleep.  When you wake up during the night.  How well you sleep.   How rested you feel the next day.  Any side effects of medicines you are taking.  What you eat and drink.   Make your bedroom a comfortable place where it is easy to fall asleep:  Put up shades or special blackout curtains to block light from outside.  Use a white noise machine to block noise.  Keep the temperature cool.   Exercise regularly as directed by your health care provider. Avoid exercising right before bedtime.  Use relaxation techniques to manage stress. Ask your health care provider to suggest some techniques that may work well for you. These may include:  Breathing exercises.  Routines to release muscle tension.  Visualizing peaceful scenes.  Cut back on alcohol, caffeinated beverages,  and cigarettes, especially close to bedtime. These can disrupt your sleep.  Do not overeat or eat spicy foods right before bedtime. This can lead to digestive discomfort that can make it hard for you to sleep.  Limit screen use before bedtime. This includes:  Watching TV.  Using your smartphone, tablet,  and computer.  Stick to a routine. This can help you fall asleep faster. Try to do a quiet activity, brush your teeth, and go to bed at the same time each night.  Get out of bed if you are still awake after 15 minutes of trying to sleep. Keep the lights down, but try reading or doing a quiet activity. When you feel sleepy, go back to bed.  Make sure that you drive carefully. Avoid driving if you feel very sleepy.  Keep all follow-up appointments as directed by your health care provider. This is important. SEEK MEDICAL CARE IF:   You are tired throughout the day or have trouble in your daily routine due to sleepiness.  You continue to have sleep problems or your sleep problems get worse. SEEK IMMEDIATE MEDICAL CARE IF:   You have serious thoughts about hurting yourself or someone else.   This information is not intended to replace advice given to you by your health care provider. Make sure you discuss any questions you have with your health care provider.   Document Released: 05/09/2000 Document Revised: 01/31/2015 Document Reviewed: 02/10/2014 [employer] Interactive Patient Education [employer]

## 2015-08-10 NOTE — Assessment & Plan Note (Signed)
2000 iu daily for one month, then 1000 iu daily when not getting adequate sun exposure

## 2015-08-10 NOTE — Progress Notes (Signed)
BP 121/78 mmHg  Pulse 94  Temp(Src) 98.7 F (37.1 C)  Wt 121 lb (54.885 kg)  SpO2 99%  LMP 08/09/2015   Subjective:    Patient ID: Mary Kramer, female    DOB: 1986-01-22, 30 y.o.   MRN: [id number]  HPI: Mary Kramer is a 30 y.o. female  Chief Complaint  Patient presents with  . ADHD    follow up and med refill   She has hx of vitamin D deficiency; she can feel that she's low; felt down last year when it was low; vitamin D was 21 Doesn't sleep well at all; thinks it is the vitamin D; more tired No daytime naps; wakes up 2-3 x at night; no trouble falling asleep Does not feel irritable or depressed No pets interrupting sleep; keeps cell phone away from bedroom; sleeps on her side Showers before bed; no caffeine  ADHD; medicine is helpful for symptoms; no headaches, no heart palpitations, no jitteriness; would like to stay on same dose  Relevant past medical, surgical, history reviewed Interim medical history since our last visit reviewed; no medical excitement Allergies and medications reviewed  Review of Systems Per HPI unless specifically indicated above     Objective:    BP 121/78 mmHg  Pulse 94  Temp(Src) 98.7 F (37.1 C)  Wt 121 lb (54.885 kg)  SpO2 99%  LMP 08/09/2015  Wt Readings from Last 3 Encounters:  08/10/15 121 lb (54.885 kg)  05/23/15 119 lb (53.978 kg)  05/11/15 119 lb (53.978 kg)    Physical Exam  Constitutional: She appears well-developed and well-nourished. No distress.  Cardiovascular: Normal rate and regular rhythm.   No extrasystoles are present.  Pulmonary/Chest: Effort normal and breath sounds normal.  Skin: No pallor.  Psychiatric: She has a normal mood and affect. Her mood appears not anxious. She does not exhibit a depressed mood.    Results for orders placed or performed in visit on 02/09/15  HM PAP SMEAR  Result Value Ref Range   HM Pap smear from PP       Assessment & Plan:   Problem List Items Addressed  This Visit      Other   ADHD (attention deficit hyperactivity disorder) (Chronic)    Continue medicine same dose; no adverse side effects; medicine is helpful for her in controlling symptoms; return for medicine check in 3 months; Rx signed, handed to patient at check-out      Insomnia - Primary    Suggested trying melatonin 3 mg same time of night for 3 weeks; continue good sleep hygiene; see AVS      Vitamin D deficiency    2000 iu daily for one month, then 1000 iu daily when not getting adequate sun exposure         Follow up plan: Return in about 3 months (around 11/10/2015) for medicine follow-up.  Meds ordered this encounter  Medications  . amphetamine-dextroamphetamine (ADDERALL) 20 MG tablet    Sig: Take 1 early afternoon and take 1 late afternoon; separate pills by at last 4 hours    Dispense:  180 tablet    Refill:  0  . amphetamine-dextroamphetamine (ADDERALL XR) 25 MG 24 hr capsule    Sig: Take 1 capsule by mouth daily.    Dispense:  90 capsule    Refill:  0   An after-visit summary was printed and given to the patient at check-out.  Please see the patient instructions which may contain other information  and recommendations beyond what is mentioned above in the assessment and plan.

## 2015-08-10 NOTE — Assessment & Plan Note (Signed)
Continue medicine same dose; no adverse side effects; medicine is helpful for her in controlling symptoms; return for medicine check in 3 months; Rx signed, handed to patient at check-out

## 2015-08-10 NOTE — Assessment & Plan Note (Signed)
Suggested trying melatonin 3 mg same time of night for 3 weeks; continue good sleep hygiene; see AVS

## 2015-08-29 ENCOUNTER — Ambulatory Visit
Admission: EM | Admit: 2015-08-29 | Discharge: 2015-08-29 | Disposition: A | Discharge status: Home / Self Care | Payer: 59 | Attending: Family Medicine | Admitting: Family Medicine

## 2015-08-29 ENCOUNTER — Ambulatory Visit (INDEPENDENT_AMBULATORY_CARE_PROVIDER_SITE_OTHER): Payer: 59

## 2015-08-29 ENCOUNTER — Encounter: Payer: Self-pay | Admitting: Emergency Medicine

## 2015-08-29 DIAGNOSIS — R1013 Epigastric pain: Secondary | ICD-10-CM

## 2015-08-29 DIAGNOSIS — K5900 Constipation, unspecified: Secondary | ICD-10-CM

## 2015-08-29 DIAGNOSIS — R109 Unspecified abdominal pain: Secondary | ICD-10-CM | POA: Diagnosis not present

## 2015-08-29 LAB — CBC WITH DIFFERENTIAL/PLATELET
BASOS ABS: 0.1 10*3/uL (ref 0–0.1)
Basophils Relative: 1 %
Eosinophils Absolute: 0 10*3/uL (ref 0–0.7)
Eosinophils Relative: 1 %
HEMATOCRIT: 41.2 % (ref 35.0–47.0)
HEMOGLOBIN: 13.7 g/dL (ref 12.0–16.0)
LYMPHS PCT: 29 %
Lymphs Abs: 3 10*3/uL (ref 1.0–3.6)
MCH: 29.1 pg (ref 26.0–34.0)
MCHC: 33.2 g/dL (ref 32.0–36.0)
MCV: 87.7 fL (ref 80.0–100.0)
Monocytes Absolute: 0.9 10*3/uL (ref 0.2–0.9)
Monocytes Relative: 9 %
NEUTROS ABS: 6.4 10*3/uL (ref 1.4–6.5)
NEUTROS PCT: 60 %
Platelets: 306 10*3/uL (ref 150–440)
RBC: 4.7 MIL/uL (ref 3.80–5.20)
RDW: 13.2 % (ref 11.5–14.5)
WBC: 10.5 10*3/uL (ref 3.6–11.0)

## 2015-08-29 LAB — COMPREHENSIVE METABOLIC PANEL
ALBUMIN: 4.5 g/dL (ref 3.5–5.0)
ALK PHOS: 41 U/L (ref 38–126)
ALT: 30 U/L (ref 14–54)
AST: 30 U/L (ref 15–41)
Anion gap: 5 (ref 5–15)
BILIRUBIN TOTAL: 0.6 mg/dL (ref 0.3–1.2)
BUN: 9 mg/dL (ref 6–20)
CALCIUM: 8.8 mg/dL — AB (ref 8.9–10.3)
CO2: 24 mmol/L (ref 22–32)
Chloride: 106 mmol/L (ref 101–111)
Creatinine, Ser: 0.62 mg/dL (ref 0.44–1.00)
GFR calc Af Amer: >60 mL/min (ref >60)
GLUCOSE: 94 mg/dL (ref 65–99)
POTASSIUM: 3.8 mmol/L (ref 3.5–5.1)
Sodium: 135 mmol/L (ref 135–145)
TOTAL PROTEIN: 7.7 g/dL (ref 6.5–8.1)

## 2015-08-29 LAB — URINALYSIS COMPLETE WITH MICROSCOPIC (ARMC ONLY)
BACTERIA UA: NONE SEEN
BILIRUBIN URINE: NEGATIVE
GLUCOSE, UA: NEGATIVE mg/dL
Hgb urine dipstick: NEGATIVE
Ketones, ur: NEGATIVE mg/dL
Leukocytes, UA: NEGATIVE
Nitrite: NEGATIVE
Protein, ur: NEGATIVE mg/dL
Specific Gravity, Urine: 1.015 (ref 1.005–1.030)
WBC UA: NONE SEEN WBC/hpf (ref 0–5)
pH: 7 (ref 5.0–8.0)

## 2015-08-29 LAB — AMYLASE: Amylase: 70 U/L (ref 28–100)

## 2015-08-29 LAB — LIPASE, BLOOD: LIPASE: 25 U/L (ref 11–51)

## 2015-08-29 LAB — PREGNANCY, URINE: PREG TEST UR: NEGATIVE

## 2015-08-29 IMAGING — CR DG ABDOMEN ACUTE W/ 1V CHEST
3 series · 3 of 3 positions shown · non-contrast
Comparison: None.

CLINICAL DATA: Acute abdominal pain yesterday, some nausea

EXAM:
DG ABDOMEN ACUTE W/ 1V CHEST

[chest pa]
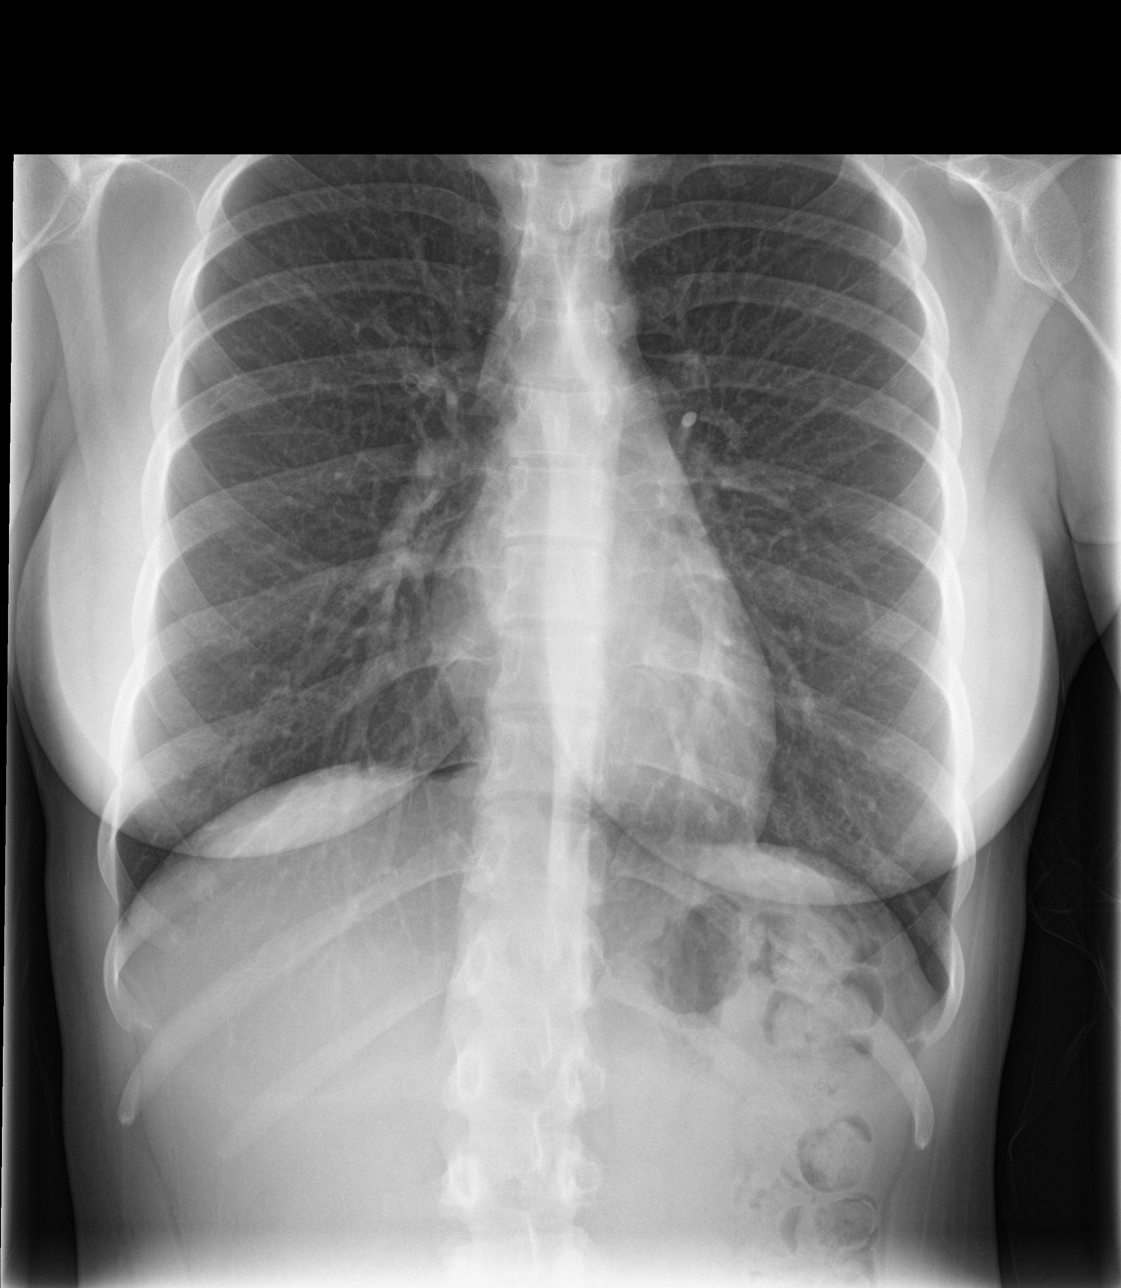

[abdomen erect]
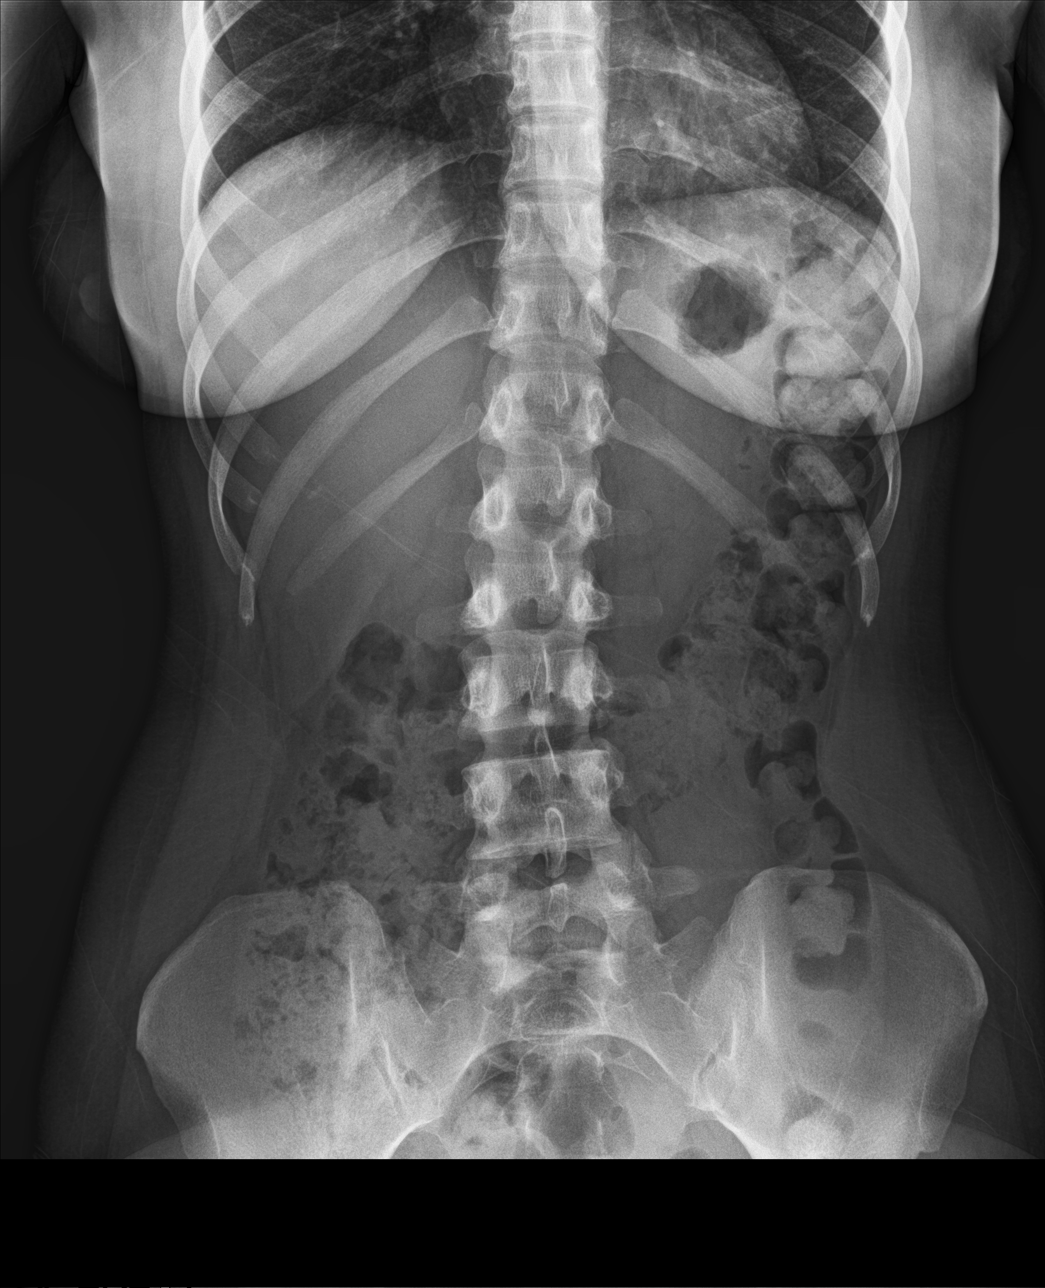

[abdomen supine]
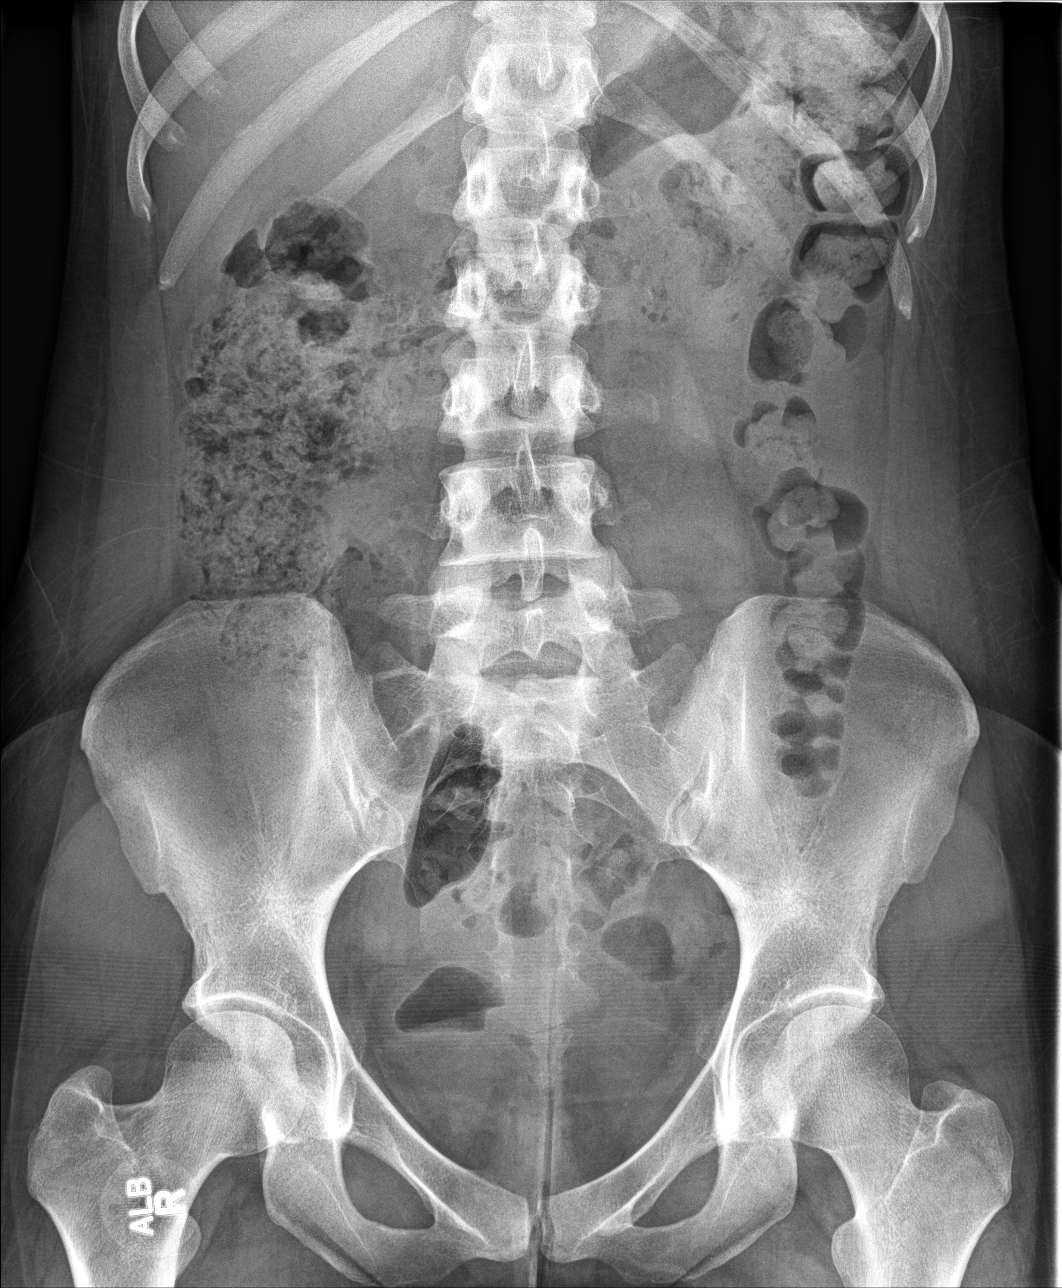

[3 of 3 positions shown; findings below may reference images not displayed]

FINDINGS: No active infiltrate or effusion is seen. Mediastinal and hilar
contours are unremarkable. The heart is within normal limits in
size. No bony abnormality is seen.

Supine and erect views of the abdomen show a moderate amount of
feces throughout the colon. No bowel obstruction is seen. No free
intraperitoneal air is noted. No opaque calculi are evident. No bony
abnormality is noted.
IMPRESSION: 1. No active lung disease.
2. No bowel obstruction.  No free air.
3. Moderate amount of feces throughout the colon.

## 2015-08-29 NOTE — ED Notes (Signed)
Patient c/o upper abdominal pain that started last night. Patient reports nausea.  Patient denies V/D.  Patient denies fevers or chills.

## 2015-08-29 NOTE — ED Provider Notes (Signed)
CSN: [id number]     Arrival date & time 08/29/15  0920 History   First MD Initiated Contact with Patient 08/29/15 1022     Chief Complaint  Patient presents with  . Abdominal Pain   (Consider location/radiation/quality/duration/timing/severity/associated sxs/prior Treatment) HPI: Patient presents today with symptoms of epigastric abdominal pain which started last night. Her pain does radiate some to the left side. Patient has had some nausea but no vomiting or diarrhea. She denies any fever or chills. She denies any upper respiratory symptoms. Her last menstrual period was approximately 3 weeks ago. She denies any history of pancreatitis or gallbladder issues. She denies any alcohol use yesterday or any spicy/greasy food. She denies any chronic GI issues. She denies any trauma to the area.  Past Medical History  Diagnosis Date  . ADHD (attention deficit hyperactivity disorder)   . Vitamin D deficiency   . ADD (attention deficit disorder)    Past Surgical History  Procedure Laterality Date  . Leep    . Leep     Family History  Problem Relation Age of Onset  . Leukemia Maternal Grandmother   . Anxiety disorder Mother   . Migraines Mother   . Hyperlipidemia Father   . Transient ischemic attack Father   . Hypertension Father   . Stroke Father   . Lung disease Father   . Cancer Paternal Grandmother     leukemia  . Diabetes Paternal Grandfather    Social History  Substance Use Topics  . Smoking status: Never Smoker   . Smokeless tobacco: Never Used  . Alcohol Use: Yes     Comment: rarely   OB History    Gravida Para Term Preterm AB TAB SAB Ectopic Multiple Living   1 0   1  0 0  0      Obstetric Comments   1st Menstrual Cycle:  13 1st Pregnancy:  23     Review of Systems: Negative except mentioned above.  Allergies  Lexapro; Hydrocodone; and Vicodin  Home Medications   Prior to Admission medications   Medication Sig Start Date End Date Taking? Authorizing Provider   amphetamine-dextroamphetamine (ADDERALL XR) 25 MG 24 hr capsule Take 1 capsule by mouth daily. 08/10/15   Kerman PasseyMelinda P Lada, MD  amphetamine-dextroamphetamine (ADDERALL) 20 MG tablet Take 1 early afternoon and take 1 late afternoon; separate pills by at last 4 hours 08/10/15   Kerman PasseyMelinda P Lada, MD  norgestimate-ethinyl estradiol (ORTHO-CYCLEN,SPRINTEC,PREVIFEM) 0.25-35 MG-MCG tablet Take 1 tablet by mouth daily.    Historical Provider, MD   Meds Ordered and Administered this Visit  Medications - No data to display  BP 107/72 mmHg  Pulse 64  Temp(Src) 98.7 F (37.1 C) (Tympanic)  Resp 16  Ht 5\' 1"  (1.549 m)  Wt 120 lb (54.432 kg)  BMI 22.69 kg/m2  SpO2 100%  LMP 08/08/2015 (Exact Date) No data found.   Physical Exam   GENERAL: NAD HEENT: no pharyngeal erythema, no exudate, no erythema of TMs, no cervical LAD RESP: CTA B CARD: RRR  ABD: +BS, mild to moderate epigastric tenderness, mild generalized left sided abdominal tenderness, no right sided abdominal tenderness, no flank tenderness   NEURO: CN II-XII grossly intact   ED Course  Procedures (including critical care time)  Labs Review Labs Reviewed  URINALYSIS COMPLETEWITH MICROSCOPIC [facility] ONLY)  PREGNANCY, URINE    Imaging Review No results found.    MDM  Abdominal pain-x-ray indicative of constipation, labs essentially normal, recommend patient try taking mag  citrate for 1 or 2 doses. She can try Pepcid or Zantac if needed. Avoid foods that cause constipation. If her symptoms do persist or worsen I do recommend that she go to the ER for further workup with CT scan as discussed.   Jolene Provost, MD 08/29/15 [phone]

## 2015-09-14 ENCOUNTER — Ambulatory Visit: Payer: Managed Care, Other (non HMO) | Admitting: Family Medicine

## 2015-12-07 ENCOUNTER — Telehealth: Payer: Self-pay | Admitting: Family Medicine

## 2015-12-07 MED ORDER — AMPHETAMINE-DEXTROAMPHET ER 25 MG PO CP24: 25.0000 mg | ORAL_CAPSULE | Freq: Every day | ORAL | Status: DC

## 2015-12-07 MED ORDER — AMPHETAMINE-DEXTROAMPHETAMINE 20 MG PO TABS: ORAL_TABLET | ORAL | Status: DC

## 2015-12-07 NOTE — Telephone Encounter (Signed)
This cannot be sent in; it will have to be picked up [place] website reviewed; no other prescribers; last fill of each was 09/05/15; appropriate; Rx approved

## 2015-12-07 NOTE — Telephone Encounter (Signed)
CALED PT BUT VOICE MAIL WAS FULL. WILL TRY AGAIN ON Monday 17TH

## 2015-12-07 NOTE — Telephone Encounter (Signed)
Pt needs refill on her Adderall ended release and they other Adderall too. If can be sent or called in pharm is [facility] in [place]. She needs 3 month supply due to insurance. Pt pt has CPE NEXT WEEK.

## 2015-12-21 ENCOUNTER — Encounter: Payer: Self-pay | Admitting: Family Medicine

## 2015-12-21 ENCOUNTER — Ambulatory Visit (INDEPENDENT_AMBULATORY_CARE_PROVIDER_SITE_OTHER): Payer: Managed Care, Other (non HMO) | Admitting: Family Medicine

## 2015-12-21 DIAGNOSIS — F909 Attention-deficit hyperactivity disorder, unspecified type: Secondary | ICD-10-CM | POA: Diagnosis not present

## 2015-12-21 MED ORDER — LISDEXAMFETAMINE DIMESYLATE 30 MG PO CAPS: 30.0000 mg | ORAL_CAPSULE | Freq: Every day | ORAL | 0 refills | Status: DC

## 2015-12-21 NOTE — Progress Notes (Signed)
BP 108/72   Pulse 96   Temp 98.2 F (36.8 C) (Oral)   Resp 16   Wt 120 lb (54.4 kg)   LMP 12/20/2015   SpO2 98%   BMI 22.67 kg/m    Subjective:    Patient ID: Mary Kramer, female    DOB: [birth year], 30 y.o.   MRN: [id number]  HPI: Mary Kramer is a 30 y.o. female  Chief Complaint  Patient presents with  . Medication Refill   She is here for f/u of ADHD Never finishes anything, starts lots of projects We reviewed the ADHD scores On the medicine, still having trouble finishing tasks She had the dose increased but it didn't work well, got bad headaches on the higher dose She has been on the adderall XR for a year and a half or two years The XR is the trouble Shorter-acting did better Current treatment just not working well [employer] in the past, did not work Did not try vyvanse or concerta See the ADHD screening questions which were done today by MD, questions answered as if she were OFF of her medicine  Depression screen PHQ 2/9 12/21/2015  Decreased Interest 0  Down, Depressed, Hopeless 0  PHQ - 2 Score 0   Relevant past medical, surgical, family and social history reviewed Past Medical History:  Diagnosis Date  . ADD (attention deficit disorder)   . ADHD (attention deficit hyperactivity disorder)   . Vitamin D deficiency    Family History  Problem Relation Age of Onset  . Leukemia Maternal Grandmother   . Anxiety disorder Mother   . Migraines Mother   . Hyperlipidemia Father   . Transient ischemic attack Father   . Hypertension Father   . Stroke Father   . Lung disease Father   . Cancer Paternal Grandmother     leukemia  . Diabetes Paternal Grandfather    Social History  Substance Use Topics  . Smoking status: Never Smoker  . Smokeless tobacco: Never Used  . Alcohol use Yes     Comment: rarely   Interim medical history since last visit reviewed. Allergies and medications reviewed  Review of Systems Per HPI unless  specifically indicated above     Objective:    BP 108/72   Pulse 96   Temp 98.2 F (36.8 C) (Oral)   Resp 16   Wt 120 lb (54.4 kg)   LMP 12/20/2015   SpO2 98%   BMI 22.67 kg/m   Wt Readings from Last 3 Encounters:  12/21/15 120 lb (54.4 kg)  08/29/15 120 lb (54.4 kg)  08/10/15 121 lb (54.9 kg)    Physical Exam  Constitutional: She appears well-developed and well-nourished.  Eyes: EOM are normal.  Cardiovascular: Normal rate and regular rhythm.   No extrasystoles are present.  Pulmonary/Chest: Effort normal and breath sounds normal.  Neurological: She displays no tremor.  Reflex Scores:      Patellar reflexes are 2+ on the right side and 2+ on the left side. Psychiatric: She has a normal mood and affect. Her behavior is normal. Judgment and thought content normal. Her mood appears not anxious. Her speech is not rapid and/or pressured. Cognition and memory are normal.      Assessment & Plan:   Problem List Items Addressed This Visit      Other   ADHD (attention deficit hyperactivity disorder) (Chronic)    Current treatment does not seem to working as well; discussed switching agents; will try vyvanse;  considered starting at 40 mg daily, but patient was more comfortable starting at 30 mg daily; Rx for 10 days of 30 mg and then patient to monitor her pulse, BP, response to therapy, and weight and call me; if working well without weight loss or rise in pulse or BP, then can continue; if dose adjustment needed, will be glad to give Rx for 40 mg and repeat her monitoring her own pulse, BP, response, and weight another 10 days; will see her back in 4 weeks       Other Visit Diagnoses   None.     Follow up plan: Return in about 4 weeks (around 01/18/2016) for medication changes.  An after-visit summary was printed and given to the patient at check-out.  Please see the patient instructions which may contain other information and recommendations beyond what is mentioned above in  the assessment and plan.  Meds ordered this encounter  Medications  . lisdexamfetamine (VYVANSE) 30 MG capsule    Sig: Take 1 capsule (30 mg total) by mouth daily.    Dispense:  10 capsule    Refill:  0

## 2015-12-21 NOTE — Assessment & Plan Note (Signed)
Current treatment does not seem to working as well; discussed switching agents; will try vyvanse; considered starting at 40 mg daily, but patient was more comfortable starting at 30 mg daily; Rx for 10 days of 30 mg and then patient to monitor her pulse, BP, response to therapy, and weight and call me; if working well without weight loss or rise in pulse or BP, then can continue; if dose adjustment needed, will be glad to give Rx for 40 mg and repeat her monitoring her own pulse, BP, response, and weight another 10 days; will see her back in 4 weeks

## 2015-12-21 NOTE — Patient Instructions (Signed)
OHIO = Only Handle It Once Check out the book Answers to Distraction by [facility] and [place] Stop the Adderall and start the new medicine Call me in 7-10 days with your heart rate and blood pressure and weight and response to medicine

## 2015-12-25 ENCOUNTER — Telehealth: Payer: Self-pay | Admitting: Family Medicine

## 2016-01-01 NOTE — Telephone Encounter (Signed)
Please refer to previous note ins will not cover vyvanse?

## 2016-01-03 MED ORDER — METHYLPHENIDATE HCL ER (OSM) 27 MG PO TBCR: 27.0000 mg | EXTENDED_RELEASE_TABLET | ORAL | 0 refills | Status: DC

## 2016-01-03 NOTE — Telephone Encounter (Signed)
Called patient, mailbox full and cannot accept messages. Script up front if patient calls

## 2016-01-03 NOTE — Telephone Encounter (Signed)
I'm sorry that her insurance won't help with this medicine, even with the copay card We'll start Concerta Rx ready Return for f/u in 3-4 weeks, call sooner with any issues

## 2016-01-03 NOTE — Telephone Encounter (Signed)
Could not reach patient °

## 2016-01-11 ENCOUNTER — Telehealth: Payer: Self-pay | Admitting: Family Medicine

## 2016-01-11 NOTE — Telephone Encounter (Signed)
Patient wanted to switch her medication to the generic form of Adderall XR 30 mg.  She stated that she feels more comfortable taking the Adderall.  Patient was informed that provider was on vacation and wouldn't respond until Monday or Tuesday of next week.

## 2016-01-18 ENCOUNTER — Ambulatory Visit: Payer: Managed Care, Other (non HMO) | Admitting: Family Medicine

## 2016-01-18 NOTE — Telephone Encounter (Signed)
Patient was supposed to come in today, but did not keep appointment I have not prescribed her the 30 mg XR and we don't just increase doses in between appointments; she was 25 mg XR previously My last note says she did not like the XR Adderall, so I'm confused I had hoped to discuss all this with her at appointment but she did not come in Please suggest that she reschedule If she would prefer to see a psychiatrist instead, just let me know and we'll make that referral

## 2016-01-21 NOTE — Telephone Encounter (Signed)
Tried calling pt and mail box is full and unable to take messages

## 2016-01-23 MED ORDER — LISDEXAMFETAMINE DIMESYLATE 40 MG PO CAPS: 40.0000 mg | ORAL_CAPSULE | ORAL | 0 refills | Status: DC

## 2016-01-23 NOTE — Telephone Encounter (Signed)
I talked with patient I apologized profusely for delay in getting back to her, but wanted to read through her chart, because I didn't think the plan was to use Adderall XR 30 We talked and decided to use Vyvanse 40 mg daily She'll check BP, pulse, weight 1-2x a week and call me in 2 weeks Pay attention to efficacy and we can adjust dose in 2 weeks if needed and tolerated

## 2016-03-04 ENCOUNTER — Telehealth: Payer: Self-pay | Admitting: Family Medicine

## 2016-03-04 ENCOUNTER — Other Ambulatory Visit: Payer: Self-pay | Admitting: Family Medicine

## 2016-03-04 NOTE — Telephone Encounter (Signed)
Pt states she would like to change from Vyvance to Adderell. Pt states this has been discussed during one of her visits.Pt is out of her meds. Pt does not have an upcoming appt.

## 2016-03-06 MED ORDER — AMPHETAMINE-DEXTROAMPHETAMINE 20 MG PO TABS: 20.0000 mg | ORAL_TABLET | Freq: Every day | ORAL | 0 refills | Status: DC

## 2016-03-06 MED ORDER — AMPHETAMINE-DEXTROAMPHET ER 25 MG PO CP24: 25.0000 mg | ORAL_CAPSULE | ORAL | 0 refills | Status: DC

## 2016-03-06 NOTE — Telephone Encounter (Signed)
I talked with patient about her symptoms; the Vyvanse made her more irritable; we'll go back to 25 mg XR adderall in the AM and short-acting 20 mg in the mid afternoon; if higher doses needed, will refer her to psych to get right dose/strength, and then she can come back to me once best regimen figured out; she agrees; Rxs ready to pick up Friday; appt in 4 weeks Discussed risk of heart attack, stroke, BP issues with patient; her heart rates were high 90s; explained why I don't want to give her 30 mg XR Adderall; she understands

## 2016-04-04 ENCOUNTER — Other Ambulatory Visit: Payer: Self-pay | Admitting: Family Medicine

## 2016-04-07 ENCOUNTER — Other Ambulatory Visit: Payer: Self-pay

## 2016-04-07 ENCOUNTER — Telehealth: Payer: Self-pay | Admitting: Family Medicine

## 2016-04-07 NOTE — Telephone Encounter (Signed)
Patient has to be seen for these Her last appt was more than 3 months ago

## 2016-04-07 NOTE — Telephone Encounter (Signed)
See patient note; she says pharmacy has sent prior auth x 2; please help her with this; thank you

## 2016-04-07 NOTE — Telephone Encounter (Signed)
I have not seen the prior auth from this patient's pharmacy so I contacted them [facility]) and asked them to resend the fax so I could get started on it.

## 2016-04-08 ENCOUNTER — Encounter: Payer: Self-pay | Admitting: Family Medicine

## 2016-04-08 ENCOUNTER — Ambulatory Visit (INDEPENDENT_AMBULATORY_CARE_PROVIDER_SITE_OTHER): Payer: 59 | Admitting: Family Medicine

## 2016-04-08 DIAGNOSIS — F909 Attention-deficit hyperactivity disorder, unspecified type: Secondary | ICD-10-CM | POA: Diagnosis not present

## 2016-04-08 MED ORDER — LISDEXAMFETAMINE DIMESYLATE 50 MG PO CAPS: 50.0000 mg | ORAL_CAPSULE | Freq: Every day | ORAL | 0 refills | Status: DC

## 2016-04-08 NOTE — Telephone Encounter (Signed)
Patient was seen today; she just opted to change the medicine She was actually waiting for prior auth for the short-acting adderall, not the XR, apparently We have switched medicine now, so no prior auth needed any more Thank you

## 2016-04-08 NOTE — Telephone Encounter (Signed)
I just got the prior auth for this patient's Adderall XR 25mg  from [facility].

## 2016-04-08 NOTE — Progress Notes (Signed)
BP 116/68   Pulse 92   Temp 98.2 F (36.8 C)   Wt 119 lb 8 oz (54.2 kg)   LMP 03/28/2016 (Approximate)   SpO2 98%   BMI 22.58 kg/m    Subjective:    Patient ID: Mary Kramer, female    DOB: April 06, 1986, 30 y.o.   MRN: [id number]  HPI: Mary Kramer is a 30 y.o. female  Chief Complaint  Patient presents with  . Medication Refill    adderall and adderall xr   She is on the XR adderall; the short-acting afternoon dose never got approved She liked the vyvanse but it made her irritated towards the end of the day; she would like to try higher dose On the medicine, no chest pains or palpitations She checked her BP and pulse on the Vyvanse No tics She says the medicine helps her stay focused; she will do five different things at work The medicine completes task  [facility] 2/9 04/08/2016 12/21/2015  Decreased Interest 0 0  Down, Depressed, Hopeless 0 0  PHQ - 2 Score 0 0   Relevant past medical, surgical, family and social history reviewed Past Medical History:  Diagnosis Date  . ADD (attention deficit disorder)   . ADHD (attention deficit hyperactivity disorder)   . Vitamin D deficiency    Family History  Problem Relation Age of Onset  . Leukemia Maternal Grandmother   . Anxiety disorder Mother   . Migraines Mother   . Hyperlipidemia Father   . Transient ischemic attack Father   . Hypertension Father   . Stroke Father   . Lung disease Father   . Cancer Paternal Grandmother     leukemia  . Diabetes Paternal Grandfather    Social History  Substance Use Topics  . Smoking status: Never Smoker  . Smokeless tobacco: Never Used  . Alcohol use Yes     Comment: rarely   Interim medical history since last visit reviewed. Allergies and medications reviewed  Review of Systems Per HPI unless specifically indicated above     Objective:    BP 116/68   Pulse 92   Temp 98.2 F (36.8 C)   Wt 119 lb 8 oz (54.2 kg)   LMP 03/28/2016  (Approximate)   SpO2 98%   BMI 22.58 kg/m   Wt Readings from Last 3 Encounters:  04/08/16 119 lb 8 oz (54.2 kg)  12/21/15 120 lb (54.4 kg)  08/29/15 120 lb (54.4 kg)    Physical Exam  Constitutional: She appears well-developed and well-nourished.  Cardiovascular: Normal rate and regular rhythm.   Pulmonary/Chest: Effort normal and breath sounds normal.  Neurological: She displays no tremor.  Reflex Scores:      Patellar reflexes are 2+ on the right side and 2+ on the left side. No tics  Psychiatric: Her mood appears not anxious. She does not exhibit a depressed mood.      Assessment & Plan:   Problem List Items Addressed This Visit      Other   ADHD (attention deficit hyperactivity disorder) (Chronic)    Patient wants to try Vyvanse again, as it worked well; she wishes to try the 50 mg strength; do not take Adderall XR and the Vyvanse together         Follow up plan: Return in about 3 months (around 07/09/2016).  An after-visit summary was printed and given to the patient at check-out.  Please see the patient instructions which may contain other information  and recommendations beyond what is mentioned above in the assessment and plan.  Meds ordered this encounter  Medications  . lisdexamfetamine (VYVANSE) 50 MG capsule    Sig: Take 1 capsule (50 mg total) by mouth daily.    Dispense:  30 capsule    Refill:  0    Stopping the Adderall completely

## 2016-04-08 NOTE — Patient Instructions (Signed)
Stop the Adderall XR completely Start the new dose of Vyvanse Check BP and pulse a few times a week and let me know your readings and your response If doing well, we'll see you back in 3 months

## 2016-04-08 NOTE — Assessment & Plan Note (Signed)
Patient wants to try Vyvanse again, as it worked well; she wishes to try the 50 mg strength; do not take Adderall XR and the Vyvanse together

## 2016-05-21 ENCOUNTER — Other Ambulatory Visit: Payer: Self-pay | Admitting: Family Medicine

## 2016-05-21 MED ORDER — LISDEXAMFETAMINE DIMESYLATE 50 MG PO CAPS: 50.0000 mg | ORAL_CAPSULE | Freq: Every day | ORAL | 0 refills | Status: DC

## 2016-05-21 NOTE — Telephone Encounter (Signed)
Reviewed last office note Reviewed [place] web site Rxs approved Note to patient, okay to come pick them up

## 2016-07-01 ENCOUNTER — Other Ambulatory Visit: Payer: Self-pay | Admitting: Family Medicine

## 2016-08-22 ENCOUNTER — Encounter: Payer: Self-pay | Admitting: Family Medicine

## 2016-08-22 ENCOUNTER — Ambulatory Visit (INDEPENDENT_AMBULATORY_CARE_PROVIDER_SITE_OTHER): Payer: 59 | Admitting: Family Medicine

## 2016-08-22 DIAGNOSIS — F909 Attention-deficit hyperactivity disorder, unspecified type: Secondary | ICD-10-CM

## 2016-08-22 MED ORDER — LISDEXAMFETAMINE DIMESYLATE 50 MG PO CAPS: 50.0000 mg | ORAL_CAPSULE | Freq: Every day | ORAL | 0 refills | Status: DC

## 2016-08-22 NOTE — Assessment & Plan Note (Addendum)
Doing well; stable; no changes; just refill meds x 90 days today, all on one script; [place] web site reviewed, no red flags

## 2016-08-22 NOTE — Progress Notes (Signed)
BP 112/62   Pulse 95   Temp 98.2 F (36.8 C) (Oral)   Resp 14   Wt 121 lb 6 oz (55.1 kg)   LMP 08/04/2016   SpO2 98%   BMI 22.93 kg/m    Subjective:    Patient ID: Mary Kramer, female    DOB: 1986-01-31, 31 y.o.   MRN: [id number]  HPI: Mary Kramer is a 31 y.o. female  Chief Complaint  Patient presents with  . Medication Refill    ADHD MEDS   She is here for f/u of ADHD Doing well overall Building a house and almost ready to move in She grinds her teeth more, no tics though; thinks it is stress from the move She wants to give that one more month and see if it resolves after she moves in No chest pain or heart palpitations Medicine keeps her focused; she wishes to continue the same dose  Depression screen [facility] 2/9 08/22/2016 04/08/2016 12/21/2015  Decreased Interest 0 0 0  Down, Depressed, Hopeless 0 0 0  PHQ - 2 Score 0 0 0   Relevant past medical, surgical, family and social history reviewed Past Medical History:  Diagnosis Date  . ADD (attention deficit disorder)   . ADHD (attention deficit hyperactivity disorder)   . Vitamin D deficiency    Family History  Problem Relation Age of Onset  . Leukemia Maternal Grandmother   . Anxiety disorder Mother   . Migraines Mother   . Hyperlipidemia Father   . Transient ischemic attack Father   . Hypertension Father   . Stroke Father   . Lung disease Father   . Cancer Paternal Grandmother     leukemia  . Diabetes Paternal Grandfather    Social History  Substance Use Topics  . Smoking status: Never Smoker  . Smokeless tobacco: Never Used  . Alcohol use Yes     Comment: rarely   Interim medical history since last visit reviewed. Allergies and medications reviewed  Review of Systems Per HPI unless specifically indicated above     Objective:    BP 112/62   Pulse 95   Temp 98.2 F (36.8 C) (Oral)   Resp 14   Wt 121 lb 6 oz (55.1 kg)   LMP 08/04/2016   SpO2 98%   BMI 22.93 kg/m   Wt  Readings from Last 3 Encounters:  08/22/16 121 lb 6 oz (55.1 kg)  04/08/16 119 lb 8 oz (54.2 kg)  12/21/15 120 lb (54.4 kg)    Physical Exam  Constitutional: She appears well-developed and well-nourished.  Eyes: EOM are normal.  Cardiovascular: Normal rate and regular rhythm.   No extrasystoles are present.  Pulmonary/Chest: Effort normal and breath sounds normal.  Neurological: She displays no tremor.  Reflex Scores:      Patellar reflexes are 2+ on the right side and 2+ on the left side. Psychiatric: She has a normal mood and affect. Her behavior is normal. Judgment and thought content normal. Her mood appears not anxious. Her speech is not rapid and/or pressured. Cognition and memory are normal.      Assessment & Plan:   Problem List Items Addressed This Visit      Other   ADHD (attention deficit hyperactivity disorder) (Chronic)    Doing well; stable; no changes; just refill meds x 90 days today, all on one script; NCCSRS web site reviewed, no red flags          Follow up plan:  Return in about 3 months (around 11/22/2016) for medicine recheck.  An after-visit summary was printed and given to the patient at check-out.  Please see the patient instructions which may contain other information and recommendations beyond what is mentioned above in the assessment and plan.  Meds ordered this encounter  Medications  . lisdexamfetamine (VYVANSE) 50 MG capsule    Sig: Take 1 capsule (50 mg total) by mouth daily.    Dispense:  90 capsule    Refill:  0    May fill now    No orders of the defined types were placed in this encounter.

## 2016-08-22 NOTE — Patient Instructions (Signed)
Stay the course Try to get regular exercise Return in 3 months

## 2016-09-24 ENCOUNTER — Telehealth: Payer: Self-pay

## 2016-09-24 NOTE — Telephone Encounter (Signed)
Pt calling to see if her labs can be done here.  They were supposed to have been done in Aug but she never went to [facility] to have them drawn.  Does she need to make an appt?  Please call [phone]

## 2016-09-25 NOTE — Telephone Encounter (Signed)
Please place order and let me know. Thanks

## 2016-09-25 NOTE — Telephone Encounter (Signed)
Not sure what labs she want to do?  The PCOS panel?  If she was supposed to go to [facility] other question is insurance status is she self pay?

## 2016-09-26 ENCOUNTER — Other Ambulatory Visit: Payer: Self-pay | Admitting: Obstetrics and Gynecology

## 2016-09-26 DIAGNOSIS — N979 Female infertility, unspecified: Secondary | ICD-10-CM

## 2016-09-26 NOTE — Telephone Encounter (Signed)
Pt states she had insurance she just didn't go. She [facility] Pt aware [facility] would have to reorder labs and I will call her once that is ordered. Message forwarded to AMS. KJ CMA

## 2016-09-26 NOTE — Telephone Encounter (Signed)
Order are in

## 2016-09-26 NOTE — Telephone Encounter (Signed)
Pt aware,via voicemail, lab order is in. Notified her she needs to call and get put on the lab schedule. KJ CMA

## 2016-10-03 ENCOUNTER — Other Ambulatory Visit: Payer: Self-pay | Admitting: Family Medicine

## 2016-10-06 MED ORDER — LISDEXAMFETAMINE DIMESYLATE 50 MG PO CAPS: 50.0000 mg | ORAL_CAPSULE | Freq: Every day | ORAL | 0 refills | Status: DC

## 2016-10-06 NOTE — Telephone Encounter (Signed)
NCCSRS web site reviewed; Rx for only 30 pills filled on 09/03/16 New 30 day prescriptions ready for pick-up here

## 2016-11-06 ENCOUNTER — Other Ambulatory Visit: Payer: 59

## 2016-11-06 DIAGNOSIS — N979 Female infertility, unspecified: Secondary | ICD-10-CM

## 2016-11-07 ENCOUNTER — Other Ambulatory Visit: Payer: PRIVATE HEALTH INSURANCE

## 2016-11-12 LAB — TSH+PRL+FSH+TESTT+LH+DHEA S...
17 HYDROXYPROGESTERONE: 19 ng/dL
Androstenedione: 76 ng/dL (ref 41–262)
DHEA SO4: 187.6 ug/dL (ref 84.8–378.0)
FSH: 9.3 m[IU]/mL
LH: 10.9 m[IU]/mL
PROLACTIN: 23.8 ng/mL — AB (ref 4.8–23.3)
TSH: 3.22 u[IU]/mL (ref 0.450–4.500)
Testosterone, Free: 0.7 pg/mL (ref 0.0–4.2)
Testosterone: 6 ng/dL — ABNORMAL LOW (ref 8–48)

## 2016-11-13 ENCOUNTER — Telehealth: Payer: Self-pay

## 2016-11-13 NOTE — Telephone Encounter (Signed)
Please advise 

## 2016-11-13 NOTE — Telephone Encounter (Signed)
Pt calling triage today requesting lab results from last week with [facility]MS. fwding to KJ.

## 2016-11-14 ENCOUNTER — Telehealth: Payer: Self-pay | Admitting: Obstetrics and Gynecology

## 2016-11-14 ENCOUNTER — Encounter: Payer: Self-pay | Admitting: Obstetrics and Gynecology

## 2016-11-14 NOTE — Telephone Encounter (Signed)
Pt request Dr. Bonney AidStaebler to return pt's call to discuss her lab results from 11/06/16. Please advise. Thanks TNP

## 2016-11-14 NOTE — Telephone Encounter (Signed)
Please advise 

## 2016-12-10 ENCOUNTER — Ambulatory Visit: Payer: 59 | Admitting: Obstetrics and Gynecology

## 2017-03-31 ENCOUNTER — Other Ambulatory Visit: Payer: Self-pay | Admitting: Family Medicine

## 2017-04-01 NOTE — Telephone Encounter (Signed)
This is a controlled substance She needs an appointment for this

## 2017-04-01 NOTE — Telephone Encounter (Signed)
Left voice message for patient to schedule an appt °

## 2017-04-01 NOTE — Telephone Encounter (Signed)
Refill request for Vyvanse 

## 2017-04-28 ENCOUNTER — Encounter: Payer: Self-pay | Admitting: Family Medicine

## 2017-04-28 ENCOUNTER — Ambulatory Visit: Payer: 59 | Admitting: Family Medicine

## 2017-04-28 VITALS — BP 130/78 | HR 65 | Temp 98.7°F | Resp 14 | Wt 122.6 lb

## 2017-04-28 DIAGNOSIS — Z79899 Other long term (current) drug therapy: Secondary | ICD-10-CM | POA: Insufficient documentation

## 2017-04-28 DIAGNOSIS — F909 Attention-deficit hyperactivity disorder, unspecified type: Secondary | ICD-10-CM

## 2017-04-28 DIAGNOSIS — J069 Acute upper respiratory infection, unspecified: Secondary | ICD-10-CM | POA: Diagnosis not present

## 2017-04-28 MED ORDER — AMPHETAMINE-DEXTROAMPHETAMINE 10 MG PO TABS: ORAL_TABLET | ORAL | 0 refills | Status: DC

## 2017-04-28 MED ORDER — ALBUTEROL SULFATE HFA 108 (90 BASE) MCG/ACT IN AERS: 2.0000 | INHALATION_SPRAY | RESPIRATORY_TRACT | 1 refills | Status: DC | PRN

## 2017-04-28 MED ORDER — AMPHETAMINE-DEXTROAMPHET ER 25 MG PO CP24: 25.0000 mg | ORAL_CAPSULE | Freq: Every day | ORAL | 0 refills | Status: DC

## 2017-04-28 NOTE — Progress Notes (Signed)
BP 130/78   Pulse 65   Temp 98.7 F (37.1 C) (Oral)   Resp 14   Wt 122 lb 9.6 oz (55.6 kg)   LMP 04/07/2017   SpO2 99%   BMI 23.17 kg/m    Subjective:    Patient ID: Mary Kramer, female    DOB: [birth year], 31 y.o.   MRN: [id number]  HPI: Mary Kramer is a 31 y.o. female  Chief Complaint  Patient presents with  . Medication Refill    HPI Patient is here for a medication refill for her ADHD Issue is chronic, ongoing, stable; affects her ability to concentrate Has benefited in the past from stimulant therapy She takes Vyvanse; last dose a few months ago but she started back on some old plain adderall that she had left-over from before the switch to Vyvanse She feels really fatigued with that medicine, the Vyvanse She has low vitamin D Energy level is good now that she's off The Adderall XR worked, but needed something in the afternoon Used the 10 mg short-acting No problems with that regimen  She is also sick; thinks she has a cold; had a fever yesterday; stopped up, drainage; lots of sickness around at work, pediatrics Not taking anything; no weird rash, mild sore throat and gone now; no wheezing this time, but woul dlike albuterol b/c has needed in the past with infections   [facility] 04/28/2017 08/22/2016 04/08/2016 12/21/2015  Decreased Interest 0 0 0 0  Down, Depressed, Hopeless 0 0 0 0  PHQ - 2 Score 0 0 0 0    Relevant past medical, surgical, family and social history reviewed Past Medical History:  Diagnosis Date  . ADD (attention deficit disorder)   . ADHD (attention deficit hyperactivity disorder)   . Vitamin D deficiency    Past Surgical History:  Procedure Laterality Date  . LEEP    . LEEP     Family History  Problem Relation Age of Onset  . Anxiety disorder Mother   . Migraines Mother   . Hyperlipidemia Father   . Transient ischemic attack Father   . Hypertension Father   . Stroke Father   . Lung disease Father     . Leukemia Maternal Grandmother   . Cancer Paternal Grandmother        leukemia  . Diabetes Paternal Grandfather    Social History   Tobacco Use  . Smoking status: Never Smoker  . Smokeless tobacco: Never Used  Substance Use Topics  . Alcohol use: Yes    Comment: rarely  . Drug use: No    Interim medical history since last visit reviewed. Allergies and medications reviewed  Review of Systems Per HPI unless specifically indicated above     Objective:    BP 130/78   Pulse 65   Temp 98.7 F (37.1 C) (Oral)   Resp 14   Wt 122 lb 9.6 oz (55.6 kg)   LMP 04/07/2017   SpO2 99%   BMI 23.17 kg/m   Wt Readings from Last 3 Encounters:  04/28/17 122 lb 9.6 oz (55.6 kg)  08/22/16 121 lb 6 oz (55.1 kg)  04/08/16 119 lb 8 oz (54.2 kg)    Physical Exam  Constitutional: She appears well-developed and well-nourished.  HENT:  Right Ear: Tympanic membrane and ear canal normal.  Left Ear: Tympanic membrane and ear canal normal.  Nose: Rhinorrhea (clear) present.  Mouth/Throat: Oropharynx is clear and moist and mucous membranes are normal.  No posterior oropharyngeal edema or posterior oropharyngeal erythema.  Eyes: EOM are normal.  Cardiovascular: Normal rate and regular rhythm.  No extrasystoles are present.  Pulmonary/Chest: Effort normal and breath sounds normal.  Lymphadenopathy:    She has no cervical adenopathy.  Neurological: She displays no tremor.  Psychiatric: She has a normal mood and affect. Her behavior is normal. Judgment and thought content normal. Her mood appears not anxious. Her speech is not rapid and/or pressured. Cognition and memory are normal.        Assessment & Plan:   Problem List Items Addressed This Visit      Other   Controlled substance agreement signed    Agreement with patient today; UDS per routine protocol; NCCSRS web site reviewed; no red flags, no other prescribers since my last Rx      ADHD (attention deficit hyperactivity disorder) -  Primary (Chronic)    Will start patient back on her stimulant therapy; suggested "OHIO" for dealing with issues; she will check her BP and heart rate and notify me; will plan to see her every 3 months      Relevant Orders   Urine drugs of abuse scrn w alc, routine (Ref Lab)    Other Visit Diagnoses    Viral URI       no antibiotics needed; rest, hydration      Follow up plan: Return in about 3 months (around 07/27/2017).  An after-visit summary was printed and given to the patient at check-out.  Please see the patient instructions which may contain other information and recommendations beyond what is mentioned above in the assessment and plan.  Meds ordered this encounter  Medications  . albuterol (PROAIR HFA) 108 (90 Base) MCG/ACT inhaler    Sig: Inhale 2 puffs into the lungs every 4 (four) hours as needed for wheezing or shortness of breath.    Dispense:  1 Inhaler    Refill:  1  . amphetamine-dextroamphetamine (ADDERALL XR) 25 MG 24 hr capsule    Sig: Take 1 capsule by mouth daily.    Dispense:  90 capsule    Refill:  0  . amphetamine-dextroamphetamine (ADDERALL) 10 MG tablet    Sig: One by mouth in the afternoon if needed    Dispense:  90 tablet    Refill:  0    Orders Placed This Encounter  Procedures  . Urine drugs of abuse scrn w alc, routine (Ref Lab)

## 2017-04-28 NOTE — Assessment & Plan Note (Signed)
Agreement with patient today; UDS per routine protocol; NCCSRS web site reviewed; no red flags, no other prescribers since my last Rx

## 2017-04-28 NOTE — Assessment & Plan Note (Signed)
Will start patient back on her stimulant therapy; suggested "OHIO" for dealing with issues; she will check her BP and heart rate and notify me; will plan to see her every 3 months

## 2017-04-28 NOTE — Patient Instructions (Signed)
Try OHIO = Only Handle It Once Let's see you back in 3 months for next medication check Check blood pressure and pulse in about five to seven days and call me

## 2017-04-29 ENCOUNTER — Other Ambulatory Visit: Payer: Self-pay

## 2017-04-29 DIAGNOSIS — J45909 Unspecified asthma, uncomplicated: Secondary | ICD-10-CM

## 2017-04-29 MED ORDER — ALBUTEROL SULFATE HFA 108 (90 BASE) MCG/ACT IN AERS: 2.0000 | INHALATION_SPRAY | RESPIRATORY_TRACT | Status: DC

## 2017-05-04 LAB — URINE DRUGS OF ABUSE SCREEN W ALC, ROUTINE (REF LAB)
ALCOHOL, ETHYL (U): NEGATIVE
AMPHETAMINE: POSITIVE — AB
AMPHETAMINES (1000 ng/mL SCRN): POSITIVE — AB
BARBITURATES: NEGATIVE
BENZODIAZEPINES: NEGATIVE
COCAINE METABOLITES: NEGATIVE
MARIJUANA MET (50 NG/ML SCRN): NEGATIVE
METHADONE: NEGATIVE
METHAQUALONE: NEGATIVE
OPIATES: NEGATIVE
PHENCYCLIDINE: NEGATIVE
PROPOXYPHENE: NEGATIVE

## 2017-07-01 ENCOUNTER — Encounter: Payer: Self-pay | Admitting: Obstetrics and Gynecology

## 2017-07-01 ENCOUNTER — Ambulatory Visit (INDEPENDENT_AMBULATORY_CARE_PROVIDER_SITE_OTHER): Payer: 59 | Admitting: Obstetrics and Gynecology

## 2017-07-01 DIAGNOSIS — Z124 Encounter for screening for malignant neoplasm of cervix: Secondary | ICD-10-CM

## 2017-07-01 DIAGNOSIS — Z01419 Encounter for gynecological examination (general) (routine) without abnormal findings: Secondary | ICD-10-CM | POA: Diagnosis not present

## 2017-07-01 DIAGNOSIS — Z1231 Encounter for screening mammogram for malignant neoplasm of breast: Secondary | ICD-10-CM

## 2017-07-01 DIAGNOSIS — Z1239 Encounter for other screening for malignant neoplasm of breast: Secondary | ICD-10-CM

## 2017-07-01 DIAGNOSIS — N920 Excessive and frequent menstruation with regular cycle: Secondary | ICD-10-CM

## 2017-07-01 NOTE — Patient Instructions (Signed)
Dameisha [email or web address]  Preventive Care 18-39 Years, Female Preventive care refers to lifestyle choices and visits with your health care provider that can promote health and wellness. What does preventive care include?  A yearly physical exam. This is also called an annual well check.  Dental exams once or twice a year.  Routine eye exams. Ask your health care provider how often you should have your eyes checked.  Personal lifestyle choices, including: ? Daily care of your teeth and gums. ? Regular physical activity. ? Eating a healthy diet. ? Avoiding tobacco and drug use. ? Limiting alcohol use. ? Practicing safe sex. ? Taking vitamin and mineral supplements as recommended by your health care provider. What happens during an annual well check? The services and screenings done by your health care provider during your annual well check will depend on your age, overall health, lifestyle risk factors, and family history of disease. Counseling Your health care provider may ask you questions about your:  Alcohol use.  Tobacco use.  Drug use.  Emotional well-being.  Home and relationship well-being.  Sexual activity.  Eating habits.  Work and work Statistician.  Method of birth control.  Menstrual cycle.  Pregnancy history.  Screening You may have the following tests or measurements:  Height, weight, and BMI.  Diabetes screening. This is done by checking your blood sugar (glucose) after you have not eaten for a while (fasting).  Blood pressure.  Lipid and cholesterol levels. These may be checked every 5 years starting at age 54.  Skin check.  Hepatitis C blood test.  Hepatitis B blood test.  Sexually transmitted disease (STD) testing.  BRCA-related cancer screening. This may be done if you have a family history of breast, ovarian, tubal, or peritoneal cancers.  Pelvic exam and Pap test. This may be done every 3 years starting at age 42. Starting  at age 50, this may be done every 5 years if you have a Pap test in combination with an HPV test.  Discuss your test results, treatment options, and if necessary, the need for more tests with your health care provider. Vaccines Your health care provider may recommend certain vaccines, such as:  Influenza vaccine. This is recommended every year.  Tetanus, diphtheria, and acellular pertussis (Tdap, Td) vaccine. You may need a Td booster every 10 years.  Varicella vaccine. You may need this if you have not been vaccinated.  HPV vaccine. If you are 27 or younger, you may need three doses over 6 months.  Measles, mumps, and rubella (MMR) vaccine. You may need at least one dose of MMR. You may also need a second dose.  Pneumococcal 13-valent conjugate (PCV13) vaccine. You may need this if you have certain conditions and were not previously vaccinated.  Pneumococcal polysaccharide (PPSV23) vaccine. You may need one or two doses if you smoke cigarettes or if you have certain conditions.  Meningococcal vaccine. One dose is recommended if you are age 55-21 years and a first-year college student living in a residence hall, or if you have one of several medical conditions. You may also need additional booster doses.  Hepatitis A vaccine. You may need this if you have certain conditions or if you travel or work in places where you may be exposed to hepatitis A.  Hepatitis B vaccine. You may need this if you have certain conditions or if you travel or work in places where you may be exposed to hepatitis B.  Haemophilus influenzae type b (Hib) vaccine. You may need  this if you have certain risk factors.  Talk to your health care provider about which screenings and vaccines you need and how often you need them. This information is not intended to replace advice given to you by your health care provider. Make sure you discuss any questions you have with your health care provider. Document Released:  07/08/2001 Document Revised: 01/30/2016 Document Reviewed: 03/13/2015 Elsevier Interactive Patient Education  [employer].

## 2017-07-01 NOTE — Progress Notes (Signed)
Patient ID: Mary Kramer, female   DOB: 03/28/1986, 32 y.o.   MRN: [id number]      Gynecology Annual Exam  PCP: Kerman Passey, MD  Chief Complaint:  Chief Complaint  Patient presents with  . Gynecologic Exam    History of Present Illness: Patient is a 32 y.o. G1P0010 presents for annual exam. The patient has no complaints today.   LMP: Patient's last menstrual period was 06/23/2017. Average Interval: regular, 28 days Duration of flow: 2 days Heavy Menses: yes Clots: yes Intermenstrual Bleeding: no Postcoital Bleeding: no Dysmenorrhea: yes  The patient is sexually active. She currently uses none for contraception. She denies dyspareunia.  The patient does perform self breast exams.  There is no notable family history of breast or ovarian cancer in her family.  The patient wears seatbelts: yes.   The patient has regular exercise: not asked.    The patient denies current symptoms of depression.    Review of Systems: Review of Systems  Constitutional: Negative for chills and fever.  HENT: Negative for congestion.   Respiratory: Negative for cough and shortness of breath.   Cardiovascular: Negative for chest pain and palpitations.  Gastrointestinal: Negative for abdominal pain, constipation, diarrhea, heartburn, nausea and vomiting.  Genitourinary: Negative for dysuria, frequency and urgency.  Skin: Negative for itching and rash.  Neurological: Negative for dizziness and headaches.  Endo/Heme/Allergies: Negative for polydipsia.  Psychiatric/Behavioral: Negative for depression.    Past Medical History:  Past Medical History:  Diagnosis Date  . ADD (attention deficit disorder)   . ADHD (attention deficit hyperactivity disorder)   . Vitamin D deficiency     Past Surgical History:  Past Surgical History:  Procedure Laterality Date  . CERVICAL BIOPSY  W/ LOOP ELECTRODE EXCISION      Gynecologic History:  Patient's last menstrual period was  06/23/2017. Contraception: none Last Pap: Results were:01/15/16 NIL and HR HPV negative   Obstetric History: G1P0010  Family History:  Family History  Problem Relation Age of Onset  . Anxiety disorder Mother   . Migraines Mother   . Hyperlipidemia Father   . Transient ischemic attack Father   . Hypertension Father   . Stroke Father   . Lung disease Father   . Leukemia Maternal Grandmother   . Cancer Paternal Grandmother        leukemia  . Diabetes Paternal Grandfather     Social History:  Social History   Socioeconomic History  . Marital status: Single    Spouse name: Not on file  . Number of children: Not on file  . Years of education: Not on file  . Highest education level: Not on file  Social Needs  . Financial resource strain: Not on file  . Food insecurity - worry: Not on file  . Food insecurity - inability: Not on file  . Transportation needs - medical: Not on file  . Transportation needs - non-medical: Not on file  Occupational History  . Not on file  Tobacco Use  . Smoking status: Never Smoker  . Smokeless tobacco: Never Used  Substance and Sexual Activity  . Alcohol use: Yes    Comment: rarely  . Drug use: No  . Sexual activity: Yes    Birth control/protection: Pill  Other Topics Concern  . Not on file  Social History Narrative  . Not on file    Allergies:  Allergies  Allergen Reactions  . Lexapro [Escitalopram Oxalate] Nausea Only  . Hydrocodone Rash  .  Vicodin [Hydrocodone-Acetaminophen] Rash    Medications: Prior to Admission medications   Medication Sig Start Date End Date Taking? Authorizing Provider  albuterol (PROAIR HFA) 108 (90 Base) MCG/ACT inhaler Inhale 2 puffs into the lungs every 4 (four) hours as needed for wheezing or shortness of breath. 04/28/17  Yes Lada, Janit Bern, MD  amphetamine-dextroamphetamine (ADDERALL XR) 25 MG 24 hr capsule Take 1 capsule by mouth daily. 04/28/17  Yes Lada, Janit Bern, MD   amphetamine-dextroamphetamine (ADDERALL) 10 MG tablet One by mouth in the afternoon if needed 04/28/17  Yes Lada, Janit Bern, MD    Physical Exam Vitals: Blood pressure 110/62, pulse 99, height 5\' 2"  (1.575 m), weight 122 lb (55.3 kg), last menstrual period 06/23/2017.  General: NAD HEENT: normocephalic, anicteric Thyroid: no enlargement, no palpable nodules Pulmonary: No increased work of breathing, CTAB Cardiovascular: RRR, distal pulses 2+ Breast: Breast symmetrical, no tenderness, no palpable nodules or masses, no skin or nipple retraction present, no nipple discharge.  No axillary or supraclavicular lymphadenopathy. Abdomen: NABS, soft, non-tender, non-distended.  Umbilicus without lesions.  No hepatomegaly, splenomegaly or masses palpable. No evidence of hernia  Genitourinary:  External: Normal external female genitalia.  Normal urethral meatus, normal  Bartholin's and Skene's glands.    Vagina: Normal vaginal mucosa, no evidence of prolapse.    Cervix: Grossly normal in appearance, no bleeding  Uterus: Non-enlarged, mobile, normal contour.  No CMT  Adnexa: ovaries non-enlarged, no adnexal masses  Rectal: deferred  Lymphatic: no evidence of inguinal lymphadenopathy Extremities: no edema, erythema, or tenderness Neurologic: Grossly intact Psychiatric: mood appropriate, affect full  Female chaperone present for pelvic and breast  portions of the physical exam    Assessment: 32 y.o. G1P0010 routine annual exam  Plan: Problem List Items Addressed This Visit    None    Visit Diagnoses    Screening for malignant neoplasm of cervix       Relevant Orders   PapIG, HPV, rfx 16/18   Breast screening       Encounter for gynecological examination without abnormal finding       Relevant Orders   TSH+Prl+FSH+TestT+LH+DHEA S...   PapIG, HPV, rfx 16/18   Menorrhagia with regular cycle       Relevant Orders   TSH+Prl+FSH+TestT+LH+DHEA S...      1) STI screening was not  offered  2) ASCCP guidelines and rational discussed.  Patient opts for yearly screening interval  3) Contraception - trying to conceive  4) Routine healthcare maintenance including cholesterol, diabetes screening discussed managed by PCP  5) We discussed the underlying etiologies which may be implicated in a couple experiencing difficulty conceiving.  The average couple will conceive within the span of 1 year with unprotected coitus, with a monthly fecundity rate of 20% or 1 in 5.  Even without further work up or intervention the patient and her partner may be successful in conceiving unassisted, although if an underlying etiology can be identified and addressed fecundity rate may improve.  The work up entails examining for ovulatory function, tubal patency, and ruling out female factor infertility.  These may be looked at concurrently or sequentially.  The downside of sequential work up is that this method may miss issues if more than one compartment is contributing.  She is aware that tubal factor or moderate to severe female factor infertility will require further consultation with a reproductive endocrinologist.  In the case of anovulation, use of Clomid (clomiphen citrate) or Femara (letrazole) were discussed with the understanding  the the later is an off-label, but well supported use.  Current recommendation are to use letrozole first line secondary to increased live birth rate compared to clomid for patients with PCOS ("Polycystic Ovarian Syndrome" ACOG Practice Bulletin 194 June 2018).  With either of these drugs the risk of multiples increases from the standard population rate of 2% to approximately 10%, with higher order multiples possible but unlikely.  Both drugs may require some time to titrate to the appropriate dosage to ensure consistent ovulation.  Cycles will be limited to 6 cycles on each drug secondary to decreasing rates of conception after 6 cycles.  In addition should patient be started on  ovulation induction with Clomid she was advised to discontinue the drug for any vision changes as this is a rare but potentially permanent side-effect if medication is continued.  Was counseled that letrozole may cause idiopathic bone pain that generally resolves.  Hot flashes, headaches, and nausea were mentioned as the most commonly encountered side-effects of both drugs.  We discussed timing of intercourse as well as the use of ovulation predictor kits identify the patient's fertile window each month.    - Intersted in HSG schedule with next cycle - Infertility labs today  6) Return in 1 year (on 07/01/2018).

## 2017-07-03 ENCOUNTER — Encounter: Payer: Self-pay | Admitting: Obstetrics and Gynecology

## 2017-07-03 LAB — PAPIG, HPV, RFX 16/18
HPV, HIGH-RISK: NEGATIVE
PAP SMEAR COMMENT: 0

## 2017-07-04 LAB — TSH+PRL+FSH+TESTT+LH+DHEA S...
17-Hydroxyprogesterone: 35 ng/dL
Androstenedione: 90 ng/dL (ref 41–262)
DHEA-SO4: 214.2 ug/dL (ref 84.8–378.0)
FSH: 4 m[IU]/mL
LH: 9.5 m[IU]/mL
PROLACTIN: 23.6 ng/mL — AB (ref 4.8–23.3)
TESTOSTERONE FREE: 1.1 pg/mL (ref 0.0–4.2)
TESTOSTERONE: 12 ng/dL (ref 8–48)
TSH: 3.24 u[IU]/mL (ref 0.450–4.500)

## 2017-07-07 ENCOUNTER — Encounter: Payer: Self-pay | Admitting: Obstetrics and Gynecology

## 2017-07-21 ENCOUNTER — Telehealth: Payer: Self-pay | Admitting: Obstetrics and Gynecology

## 2017-07-21 ENCOUNTER — Other Ambulatory Visit: Payer: Self-pay | Admitting: Obstetrics and Gynecology

## 2017-07-21 ENCOUNTER — Other Ambulatory Visit: Payer: Self-pay | Admitting: Family Medicine

## 2017-07-21 DIAGNOSIS — N979 Female infertility, unspecified: Secondary | ICD-10-CM

## 2017-07-21 NOTE — Telephone Encounter (Signed)
Order is in.

## 2017-07-21 NOTE — Telephone Encounter (Signed)
Patient is scheduled for Thursday, 07/30/17 [facility]pm [facility] There are no special instructions. Lmtrc.

## 2017-07-21 NOTE — Telephone Encounter (Signed)
Pt started her cycle on 07/19/17 and was calling to get the HSG procedure set up.   Cb# [phone] ok to leave message on this number.

## 2017-07-21 NOTE — Progress Notes (Signed)
lmp 07/21/17

## 2017-07-22 NOTE — Telephone Encounter (Signed)
Patient is aware of appointment and location.

## 2017-07-28 ENCOUNTER — Ambulatory Visit: Payer: 59 | Admitting: Family Medicine

## 2017-07-28 ENCOUNTER — Encounter: Payer: Self-pay | Admitting: Family Medicine

## 2017-07-28 DIAGNOSIS — F909 Attention-deficit hyperactivity disorder, unspecified type: Secondary | ICD-10-CM | POA: Diagnosis not present

## 2017-07-28 MED ORDER — AMPHETAMINE-DEXTROAMPHET ER 25 MG PO CP24: 25.0000 mg | ORAL_CAPSULE | Freq: Every day | ORAL | 0 refills | Status: DC

## 2017-07-28 MED ORDER — AMPHETAMINE-DEXTROAMPHETAMINE 10 MG PO TABS: ORAL_TABLET | ORAL | 0 refills | Status: DC

## 2017-07-28 NOTE — Progress Notes (Signed)
BP 116/74   Pulse 90   Temp 98.7 F (37.1 C) (Oral)   Resp 14   Wt 122 lb (55.3 kg)   LMP 07/21/2017   SpO2 96%   BMI 22.31 kg/m    Subjective:    Patient ID: Mary Kramer, female    DOB: 03-12-1986, 32 y.o.   MRN: [id number]  HPI: Mary Kramer is a 32 y.o. female  Chief Complaint  Patient presents with  . Follow-up  . Medication Refill    HPI Patient had requested refill and there was a glitch apparently, working on it  RAD, triggered by colds; not laughter; not cold air; not exercising; no needs, already has SABA refill  ADHD; on the 25 long-acting and 10 mg in the afternoon most days Morning is good; takes the 25 mg around 8:30 am, then around 2:30 or 3 pm, less focused at that time Takes the 2nd pill 10 mg and that gets her through the next 4-6 hours No trouble sleeping No jittering or shakiness; appetite is okay; no headaches Would like to continue She used to be on the 30 mg XR adderall and was having headaches, but found out that the headaches were all TMJ related; not medicine related; on the higher 30 mg for a few weeks;   Depression screen [facility] 2/9 07/28/2017 04/28/2017 08/22/2016 04/08/2016 12/21/2015  Decreased Interest 0 0 0 0 0  Down, Depressed, Hopeless 0 0 0 0 0  PHQ - 2 Score 0 0 0 0 0    Relevant past medical, surgical, family and social history reviewed Past Medical History:  Diagnosis Date  . ADD (attention deficit disorder)   . ADHD (attention deficit hyperactivity disorder)   . Vitamin D deficiency    Past Surgical History:  Procedure Laterality Date  . CERVICAL BIOPSY  W/ LOOP ELECTRODE EXCISION    . DG HYSTEROGRAM (HSG)  07/30/2017       Family History  Problem Relation Age of Onset  . Anxiety disorder Mother   . Migraines Mother   . Hyperlipidemia Father   . Transient ischemic attack Father   . Hypertension Father   . Stroke Father   . Lung disease Father   . Leukemia Maternal Grandmother   . Cancer Paternal  Grandmother        leukemia  . Diabetes Paternal Grandfather    Social History   Tobacco Use  . Smoking status: Never Smoker  . Smokeless tobacco: Never Used  Substance Use Topics  . Alcohol use: Yes    Comment: rarely  . Drug use: No    Interim medical history since last visit reviewed. Allergies and medications reviewed  Review of Systems Per HPI unless specifically indicated above     Objective:    BP 116/74   Pulse 90   Temp 98.7 F (37.1 C) (Oral)   Resp 14   Wt 122 lb (55.3 kg)   LMP 07/21/2017   SpO2 96%   BMI 22.31 kg/m   Wt Readings from Last 3 Encounters:  07/28/17 122 lb (55.3 kg)  07/01/17 122 lb (55.3 kg)  04/28/17 122 lb 9.6 oz (55.6 kg)    Physical Exam  Constitutional: She appears well-developed and well-nourished.  Eyes: EOM are normal.  Cardiovascular: Normal rate and regular rhythm.  No extrasystoles are present.  Pulmonary/Chest: Effort normal and breath sounds normal.  Neurological: She displays no tremor.  Reflex Scores:      Patellar reflexes are 2+ on  the right side and 2+ on the left side. Psychiatric: She has a normal mood and affect. Her behavior is normal. Judgment and thought content normal. Her mood appears not anxious. Her speech is not rapid and/or pressured. Cognition and memory are normal.       Assessment & Plan:   Problem List Items Addressed This Visit      Other   ADHD (attention deficit hyperactivity disorder) (Chronic)    We'll continue current regimen; I will be willing [facility] we need to consider the 30 mg strength long-acting again; headaches were NOT due to the 30 mg; will check to see if BP or pulse was affected; for now, continue 25 morning and 10 afternoon          Follow up plan: Return in about 3 months (around 10/28/2017) for follow-up visit with Dr. Sherie DonLada.  An after-visit summary was printed and given to the patient at check-out.  Please see the patient instructions  which may contain other information and recommendations beyond what is mentioned above in the assessment and plan.  Meds ordered this encounter  Medications  . DISCONTD: amphetamine-dextroamphetamine (ADDERALL) 10 MG tablet    Sig: One by mouth in the afternoon if needed    Dispense:  30 tablet    Refill:  0    Rx #1 may fill now  . DISCONTD: amphetamine-dextroamphetamine (ADDERALL XR) 25 MG 24 hr capsule    Sig: Take 1 capsule by mouth daily.    Dispense:  30 capsule    Refill:  0    Rx #1, may fill now  . DISCONTD: amphetamine-dextroamphetamine (ADDERALL XR) 25 MG 24 hr capsule    Sig: Take 1 capsule by mouth daily.    Dispense:  30 capsule    Refill:  0    Rx #2, may fill on or after August 27, 2017  . DISCONTD: amphetamine-dextroamphetamine (ADDERALL) 10 MG tablet    Sig: One by mouth in the afternoon if needed    Dispense:  30 tablet    Refill:  0    Rx #2, may fill on or after August 27, 2017  . amphetamine-dextroamphetamine (ADDERALL XR) 25 MG 24 hr capsule    Sig: Take 1 capsule by mouth daily.    Dispense:  30 capsule    Refill:  0    Rx #3, may fill on or after Sep 26, 2017  . amphetamine-dextroamphetamine (ADDERALL) 10 MG tablet    Sig: One by mouth in the afternoon if needed    Dispense:  30 tablet    Refill:  0    Rx #3, may fill on or after Sep 26, 2017    No orders of the defined types were placed in this encounter.

## 2017-07-28 NOTE — Patient Instructions (Signed)
Consider checking out the book Answers to Distraction by Hallowell and Ratey Continue current medicines Return in 3 months

## 2017-07-28 NOTE — Assessment & Plan Note (Signed)
We'll continue current regimen; I will be willing [facility] if we need to consider the 30 mg strength long-acting again; headaches were NOT due to the 30 mg; will check to see if BP or pulse was affected; for now, continue 25 morning and 10 afternoon

## 2017-07-30 ENCOUNTER — Ambulatory Visit
Admission: RE | Admit: 2017-07-30 | Discharge: 2017-07-30 | Disposition: A | Discharge status: Home / Self Care | Payer: 59 | Source: Ambulatory Visit | Attending: Obstetrics and Gynecology | Admitting: Obstetrics and Gynecology

## 2017-07-30 DIAGNOSIS — N979 Female infertility, unspecified: Secondary | ICD-10-CM | POA: Diagnosis not present

## 2017-07-30 HISTORY — PX: DG HYSTEROGRAM (HSG): IMG2510

## 2017-07-30 NOTE — Procedures (Signed)
Patient consent form signed and reviewed.  Time out performed.  The patient was positioned in the dorsal lithotomy position.  A sterile speculum was placed to visualize the cervix.  Cervix was prepped with betadine.  The HSG catheter which was previously primed with isoview 350 was placed through the cervical os, then inflated.  Speculum was removed.  15mL of contrast was instilled with bilateral spill of contrast noted from both fallopian tubes, which displayed a normal course and caliber.  The uterine contour was normal.  Patient tolerated procedure well other than some nausea with the procedure.

## 2018-01-01 ENCOUNTER — Ambulatory Visit: Payer: 59 | Admitting: Family Medicine

## 2018-01-01 ENCOUNTER — Encounter: Payer: Self-pay | Admitting: Family Medicine

## 2018-01-01 VITALS — BP 110/68 | HR 78 | Temp 98.1°F | Resp 12 | Ht 62.0 in | Wt 120.6 lb

## 2018-01-01 DIAGNOSIS — Z5181 Encounter for therapeutic drug level monitoring: Secondary | ICD-10-CM | POA: Diagnosis not present

## 2018-01-01 DIAGNOSIS — F909 Attention-deficit hyperactivity disorder, unspecified type: Secondary | ICD-10-CM | POA: Diagnosis not present

## 2018-01-01 DIAGNOSIS — Z79899 Other long term (current) drug therapy: Secondary | ICD-10-CM

## 2018-01-01 MED ORDER — DEXMETHYLPHENIDATE HCL ER 10 MG PO CP24: 10.0000 mg | ORAL_CAPSULE | Freq: Every day | ORAL | 0 refills | Status: DC

## 2018-01-01 NOTE — Patient Instructions (Signed)
Take sublingual vitamin B12 500 or 1000 mcg every afternoon that attention is needed Call me if that is not sufficient, along with some jumping jacks

## 2018-01-01 NOTE — Assessment & Plan Note (Signed)
UTD; Dec 2018; UDS today per protocol; no concerns for misuse or diversion

## 2018-01-01 NOTE — Progress Notes (Signed)
BP 110/68   Pulse 78   Temp 98.1 F (36.7 C) (Oral)   Resp 12   Ht 5\' 2"  (1.575 m)   Wt 120 lb 9.6 oz (54.7 kg)   LMP 12/23/2017   SpO2 99%   BMI 22.06 kg/m    Subjective:    Patient ID: Mary Kramer, female    DOB: [birth year], 32 y.o.   MRN: [id number]  HPI: Mary Kramer is a 32 y.o. female  Chief Complaint  Patient presents with  . 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 ADD (attention deficit disorder)   . ADHD (attention deficit hyperactivity disorder)   . Vitamin D deficiency    Past Surgical History:  Procedure Laterality Date  . CERVICAL BIOPSY  W/ LOOP ELECTRODE EXCISION    . DG HYSTEROGRAM (HSG)  07/30/2017       Family History  Problem Relation Age of Onset  . Anxiety disorder Mother   . Migraines Mother   . Hyperlipidemia Father   . Transient ischemic attack Father   . Hypertension Father   . Stroke Father   . Lung disease Father   . Atrial fibrillation Maternal Grandmother   . Cancer Paternal Grandmother        leukemia  . Diabetes Paternal Grandfather     . Cerebral aneurysm Paternal Aunt   . Cerebral aneurysm Maternal Grandfather    Social History   Tobacco Use  . Smoking status: Never Smoker  . Smokeless tobacco: Never Used  Substance Use Topics  . Alcohol use: Not Currently    Comment: rarely  . Drug use: No    Interim medical history since last visit reviewed. Allergies and medications reviewed  Review of Systems Per HPI unless specifically indicated above     Objective:    BP 110/68   Pulse 78   Temp 98.1 F (36.7 C) (Oral)   Resp 12   Ht 5\' 2"  (1.575 m)   Wt 120 lb 9.6 oz (54.7 kg)   LMP 12/23/2017   SpO2 99%   BMI 22.06 kg/m   Wt Readings from Last 3 Encounters:  01/01/18 120 lb 9.6 oz (54.7 kg)  07/28/17 122 lb (55.3 kg)  07/01/17 122 lb (55.3 kg)    Physical Exam  Constitutional: She appears well-developed and well-nourished.  Eyes: EOM are normal.  Cardiovascular: Normal rate and regular rhythm.  No extrasystoles are present.  Pulmonary/Chest: Effort normal and breath sounds normal.  Neurological:  She displays no tremor.  Reflex Scores:      Patellar reflexes are 2+ on the right side and 2+ on the left side. Psychiatric: She has a normal mood and affect. Her behavior is normal. Judgment and thought content normal. Her mood appears not anxious. Her speech is not rapid and/or pressured. Cognition and memory are normal.    Results for orders placed or performed in visit on 07/01/17  TSH+Prl+FSH+TestT+LH+DHEA S...  Result Value Ref Range   TSH 3.240 0.450 - 4.500 uIU/mL   Testosterone 12 8 - 48 ng/dL   Testosterone, Free 1.1 0.0 - 4.2 pg/mL   LH 9.5 mIU/mL   FSH 4.0 mIU/mL   Prolactin 23.6 (H) 4.8 - 23.3 ng/mL   17-Hydroxyprogesterone 35 ng/dL   DHEA-SO4 696.2214.2 95.284.8 - 378.0 ug/dL   Androstenedione 90 41 - 262 ng/dL  PapIG, HPV, rfx 84/1316/18  Result Value Ref Range   DIAGNOSIS: Comment    Specimen adequacy: Comment    Clinician Provided ICD10 Comment    Performed by: Comment    PAP Smear Comment .     Note: Comment    Test Methodology Comment    HPV, high-risk Negative Negative      Assessment & Plan:   Problem List Items Addressed This Visit      Other   Controlled substance agreement signed    UTD; Dec 2018; UDS today per protocol; no concerns for misuse or diversion      Relevant Orders   Urine Drug Screen w/Alc, no confirm   ADHD (attention deficit hyperactivity disorder) - Primary (Chronic)    Patient wishes to switch to Focalin; will start at 10 mg and have her return in 2 weeks for reassessment, BP and pulse, etc; encouraged jumping jacks or other short burst of exercise when feeling mentally sluggish at work; try B12 in the early afternoon to help with later afternoon issues, fatigue      Relevant Orders   Urine Drug Screen w/Alc, no confirm    Other Visit Diagnoses    Medication monitoring encounter       Relevant Orders   Urine Drug Screen w/Alc, no confirm       Follow up plan: Return in about 2 weeks (around 01/15/2018) for follow-up visit with Dr. Sherie DonLada.  An after-visit summary was printed and given to the patient at check-out.  Please see the patient instructions which may contain other information and recommendations beyond what is mentioned above in the assessment and plan.  Meds ordered this encounter  Medications  . dexmethylphenidate (FOCALIN XR) 10 MG 24 hr capsule    Sig: Take 1 capsule (10 mg total) by mouth daily.    Dispense:  30 capsule    Refill:  0    Orders Placed This Encounter  Procedures  . Urine Drug Screen w/Alc, no confirm

## 2018-01-01 NOTE — Assessment & Plan Note (Signed)
Patient wishes to switch to Focalin; will start at 10 mg and have her return in 2 weeks for reassessment, BP and pulse, etc; encouraged jumping jacks or other short burst of exercise when feeling mentally sluggish at work; try B12 in the early afternoon to help with later afternoon issues, fatigue

## 2018-01-02 LAB — DRUG SCREEN URINE W/ALC, NO CONF
ALCOHOL, ETHYL (U): NEGATIVE
AMPHETAMINES (1000 NG/ML SCRN): POSITIVE — AB
BARBITURATES: NEGATIVE
BENZODIAZEPINES: NEGATIVE
COCAINE METABOLITES: NEGATIVE
MARIJUANA MET (50 ng/mL SCRN): NEGATIVE
METHADONE: NEGATIVE
METHAQUALONE: NEGATIVE
OPIATES: NEGATIVE
PHENCYCLIDINE: NEGATIVE
PROPOXYPHENE: NEGATIVE

## 2018-01-05 ENCOUNTER — Ambulatory Visit: Payer: 59 | Admitting: Obstetrics and Gynecology

## 2018-01-21 ENCOUNTER — Encounter: Payer: Self-pay | Admitting: Obstetrics and Gynecology

## 2018-01-21 ENCOUNTER — Ambulatory Visit (INDEPENDENT_AMBULATORY_CARE_PROVIDER_SITE_OTHER): Payer: 59 | Admitting: Obstetrics and Gynecology

## 2018-01-21 VITALS — BP 116/68 | HR 102 | Wt 123.0 lb

## 2018-01-21 DIAGNOSIS — N923 Ovulation bleeding: Secondary | ICD-10-CM | POA: Diagnosis not present

## 2018-01-23 NOTE — Progress Notes (Signed)
Obstetrics & Gynecology Office Visit   Chief Complaint:  Chief Complaint  Patient presents with  . Discuss Clomid    trying to conceive x1.5 years    History of Present Illness: 32 year old G1P0010 presenting to discuss next steps in trying to conceive.  She reports menstrual cycles have been regular monthly.  She has noted some intermenstrual spotting which she assumed was around the time she ovulated.  No postcoital spotting or bleeding.  She is interested in exploring additional steps prior to conceiving.    Review of Systems: Review of Systems  Constitutional: Negative.   Gastrointestinal: Negative.   Genitourinary: Negative.      Past Medical History:  Past Medical History:  Diagnosis Date  . ADD (attention deficit disorder)   . ADHD (attention deficit hyperactivity disorder)   . Vitamin D deficiency     Past Surgical History:  Past Surgical History:  Procedure Laterality Date  . CERVICAL BIOPSY  W/ LOOP ELECTRODE EXCISION    . DG HYSTEROGRAM (HSG)  07/30/2017        Gynecologic History: Patient's last menstrual period was 01/18/2018.  Obstetric History: G1P0010  Family History:  Family History  Problem Relation Age of Onset  . Anxiety disorder Mother   . Migraines Mother   . Hyperlipidemia Father   . Transient ischemic attack Father   . Hypertension Father   . Stroke Father   . Lung disease Father   . Atrial fibrillation Maternal Grandmother   . Cancer Paternal Grandmother        leukemia  . Diabetes Paternal Grandfather   . Cerebral aneurysm Paternal Aunt   . Cerebral aneurysm Maternal Grandfather     Social History:  Social History   Socioeconomic History  . Marital status: Single    Spouse name: Not on file  . Number of children: Not on file  . Years of education: Not on file  . Highest education level: Not on file  Occupational History  . Not on file  Social Needs  . Financial resource strain: Not on file  . Food insecurity:   Worry: Not on file    Inability: Not on file  . Transportation needs:    Medical: Not on file    Non-medical: Not on file  Tobacco Use  . Smoking status: Never Smoker  . Smokeless tobacco: Never Used  Substance and Sexual Activity  . Alcohol use: Not Currently    Comment: rarely  . Drug use: No  . Sexual activity: Yes    Birth control/protection: Pill  Lifestyle  . Physical activity:    Days per week: 1 day    Minutes per session: 30 min  . Stress: Not at all  Relationships  . Social connections:    Talks on phone: More than three times a week    Gets together: More than three times a week    Attends religious service: More than 4 times per year    Active member of club or organization: No    Attends meetings of clubs or organizations: Never    Relationship status: Married  . Intimate partner violence:    Fear of current or ex partner: No    Emotionally abused: No    Physically abused: No    Forced sexual activity: No  Other Topics Concern  . Not on file  Social History Narrative  . Not on file    Allergies:  Allergies  Allergen Reactions  . Lexapro [  Escitalopram Oxalate] Nausea Only  . Hydrocodone Rash  . Vicodin [Hydrocodone-Acetaminophen] Rash    Medications: Prior to Admission medications   Medication Sig Start Date End Date Taking? Authorizing Provider  albuterol (PROAIR HFA) 108 (90 Base) MCG/ACT inhaler Inhale 2 puffs into the lungs every 4 (four) hours as needed for wheezing or shortness of breath. 04/28/17  Yes Lada, Janit BernMelinda P, MD  dexmethylphenidate (FOCALIN XR) 10 MG 24 hr capsule Take 1 capsule (10 mg total) by mouth daily. 01/01/18  Yes Kerman PasseyLada, Melinda P, MD    Physical Exam Vitals:  Vitals:   01/21/18 1548  BP: 116/68  Pulse: (!) 102   Patient's last menstrual period was 01/18/2018.  General: NAD HEENT: normocephalic, anicteric Pulmonary: No increased work of breathing Neurologic: Grossly intact Psychiatric: mood appropriate, affect  full  Assessment: 32 y.o. G1P0010 presenting for discussion on starting ovulation induction as well as intermenstrual spotting  Plan: Problem List Items Addressed This Visit    None    Visit Diagnoses    Intermenstrual bleeding    -  Primary   Relevant Orders   US Sonohysterogram     1) Intermenstrual spotting - cycles remain regular.  PCOS panel was negative and her HSG displayed bilateral tubal patency.  She is interested in trial of letrozole.  .Today is cycle day 4.  Given intermenstrual spotting we discussed uterine structural lesion such as polyps which may also have implications as far as successful implantation.  SIS ordered to rule out focal endometrial defect.     In the case of anovulation, use of Clomid (clomiphen citrate) or Femara (letrazole) were discussed with the understanding the the later is an off-label, but well supported use.  Current recommendation are to use letrozole first line secondary to increased live birth rate compared to clomid for patients with PCOS ("Polycystic Ovarian Syndrome" ACOG Practice Bulletin 194 June 2018).  With either of these drugs the risk of multiples increases from the standard population rate of 2% to approximately 10%, with higher order multiples possible but unlikely.  Both drugs may require some time to titrate to the appropriate dosage to ensure consistent ovulation.  Cycles will be limited to 6 cycles on each drug secondary to decreasing rates of conception after 6 cycles.  In addition should patient be started on ovulation induction with Clomid she was advised to discontinue the drug for any vision changes as this is a rare but potentially permanent side-effect if medication is continued.  Was counseled that letrozole may cause idiopathic bone pain that generally resolves.  Hot flashes, headaches, and nausea were mentioned as the most commonly encountered side-effects of both drugs.  We discussed timing of intercourse as well as the use of  ovulation predictor kits identify the patient's fertile window each month.     In cases of unexplained infertility addition of ovulation induction agent does not show normal fecundity rate.  We discussed should we have failure to conceive consultation with a reproductive endocrinologist would be recommended.  A total of 15 minutes were spent in face-to-face contact with the patient during this encounter with over half of that time devoted to counseling and coordination of care.  Return in about 5 days (around 01/26/2018) for SIS and follow up somtime in the next five days.    Vena AustriaAndreas Joseantonio Dittmar, MD, Merlinda FrederickFACOG [facility], [facility]

## 2018-01-26 ENCOUNTER — Ambulatory Visit (INDEPENDENT_AMBULATORY_CARE_PROVIDER_SITE_OTHER): Payer: 59

## 2018-01-26 ENCOUNTER — Ambulatory Visit (INDEPENDENT_AMBULATORY_CARE_PROVIDER_SITE_OTHER): Payer: 59 | Admitting: Obstetrics and Gynecology

## 2018-01-26 ENCOUNTER — Other Ambulatory Visit: Payer: Self-pay | Admitting: Obstetrics and Gynecology

## 2018-01-26 DIAGNOSIS — N923 Ovulation bleeding: Secondary | ICD-10-CM

## 2018-01-26 NOTE — Progress Notes (Signed)
Gynecology Ultrasound Follow Up  Chief Complaint: No chief complaint on file.    History of Present Illness: Patient is a 32 y.o. female who presents today for ultrasound evaluation of intermenstrual bleeding for the past few cycles.  This occurs at regular intervals mid-cycle.  Menstrual pattern otherwise normal.  Given attempting to conceive SIS today to rule out endometrial pathology.  Paramter Normal / Abnormal Prsent  Frequency Amenoorhea     Infrequent (>38 days)     Normal (?24 days ?38 days) X   Freequent (<24 days)    Duration Normal (?8 days) X   Prolonged (>8 days)    Regularity Regular (shortest to longest cycle variation ?7-9 days)* X   Irregular (shortest to longest cycle variation ?8-10days)*    Flow Volume Light    (Self reported) Normal X   Heavy        Intermenstrual Bleeding None     Random     Cyclical early     Cyclical mid X   Cyclical late        Unscheduled Bleeding  Not applicable    (exogenous hormones) Absent     Present     FIGO AUB I System: *The available evidence suggests that, using these criteria, the normal range (shortest to longest) varies with age: 27-25 y of age, ?30 d; 28-41 y, ?7 d; and for 32-45 y, ?9 d   Ultrasound demonstrates the following findgins Adnexa: no masses seen  Uterus: Non-enlarged with trilaminar endometrial stripe no focal endometrial/filling defects noted on SIS Additional: no free fluid  Review of Systems: Review of Systems  Constitutional: Negative.   Gastrointestinal: Negative.   Genitourinary: Negative.     Past Medical History:  Past Medical History:  Diagnosis Date  . ADD (attention deficit disorder)   . ADHD (attention deficit hyperactivity disorder)   . Vitamin D deficiency     Past Surgical History:  Past Surgical History:  Procedure Laterality Date  . CERVICAL BIOPSY  W/ LOOP ELECTRODE EXCISION    . DG HYSTEROGRAM (HSG)  07/30/2017        Gynecologic History:  Patient's last menstrual  period was 01/18/2018. Contraception: none Last Pap: 07/01/17 Results were: .NIL HPV negative  Family History:  Family History  Problem Relation Age of Onset  . Anxiety disorder Mother   . Migraines Mother   . Hyperlipidemia Father   . Transient ischemic attack Father   . Hypertension Father   . Stroke Father   . Lung disease Father   . Atrial fibrillation Maternal Grandmother   . Cancer Paternal Grandmother        leukemia  . Diabetes Paternal Grandfather   . Cerebral aneurysm Paternal Aunt   . Cerebral aneurysm Maternal Grandfather     Social History:  Social History   Socioeconomic History  . Marital status: Single    Spouse name: Not on file  . Number of children: Not on file  . Years of education: Not on file  . Highest education level: Not on file  Occupational History  . Not on file  Social Needs  . Financial resource strain: Not on file  . Food insecurity:    Worry: Not on file    Inability: Not on file  . Transportation needs:    Medical: Not on file    Non-medical: Not on file  Tobacco Use  . Smoking status: Never Smoker  . Smokeless tobacco: Never Used  Substance and Sexual Activity  .  Alcohol use: Not Currently    Comment: rarely  . Drug use: No  . Sexual activity: Yes    Birth control/protection: Pill  Lifestyle  . Physical activity:    Days per week: 1 day    Minutes per session: 30 min  . Stress: Not at all  Relationships  . Social connections:    Talks on phone: More than three times a week    Gets together: More than three times a week    Attends religious service: More than 4 times per year    Active member of club or organization: No    Attends meetings of clubs or organizations: Never    Relationship status: Married  . Intimate partner violence:    Fear of current or ex partner: No    Emotionally abused: No    Physically abused: No    Forced sexual activity: No  Other Topics Concern  . Not on file  Social History Narrative  .  Not on file    Allergies:  Allergies  Allergen Reactions  . Lexapro [Escitalopram Oxalate] Nausea Only  . Hydrocodone Rash  . Vicodin [Hydrocodone-Acetaminophen] Rash    Medications: Prior to Admission medications   Medication Sig Start Date End Date Taking? Authorizing Provider  albuterol (PROAIR HFA) 108 (90 Base) MCG/ACT inhaler Inhale 2 puffs into the lungs every 4 (four) hours as needed for wheezing or shortness of breath. 04/28/17   Kerman Passey, MD  dexmethylphenidate (FOCALIN XR) 10 MG 24 hr capsule Take 1 capsule (10 mg total) by mouth daily. 01/01/18   Kerman Passey, MD    Physical Exam Vitals: Last menstrual period 01/18/2018.  General: NAD HEENT: normocephalic, anicteric Pulmonary: No increased work of breathing Extremities: no edema, erythema, or tenderness Neurologic: Grossly intact, normal gait Psychiatric: mood appropriate, affect full  [place] Sonohysterogram  Result Date: 01/26/2018 ULTRASOUND REPORT Patient Name: Mary Kramer DOB: February 23, 1986 MRN: [id number] Location: [facility] Date of Service: 01/26/2018 Indications:Intermenstrual bleeding Findings: The uterus is anteverted and measures 8.48 x 5.29 x 4.09cm. Echo texture is homogenous without evidence of focal masses. The Endometrium measures 7.54 mm. - tri-laminar appearance Right Ovary measures 3.07 x 2.00 x 2.73 cm. It is normal in appearance. Left Ovary measures 2.95 x 2.24 x 1.88 cm. It is normal in appearance. Survey of the adnexa demonstrates no adnexal masses. There is no free fluid in the cul de sac. Consent form signed. Sonohysterogram performed using 0.9% normal saline. Normal saline injected into the endometrial canal using transvaginal ultrasound guidance. Negative for filling defect. Initial Endometrium: 7.31mm Final Endometrium: 5.20mm Impression: 1. Normal ultrasound and [facility] Recommendations: 1.Clinical correlation with the patient's History and Physical Exam. Willette Alma, RDMS, RVT Images  reviewed.  Normal GYN study without visualized pathology. On SIS no focal filling defects are visualized. Vena Austria, MD, Evern [facility], [facility] 01/26/2018, 12:44 PM   Assessment: 32 y.o. G1P0010 follow up for mid-cycle spotting  Plan: Problem List Items Addressed This Visit    None    Visit Diagnoses    Intermenstrual bleeding    -  Primary      1) Cyclical Midcyle Intermenstrual Bleeding - Trilaminar stripe, no focal endometrial abnormalities.  While not a classic ovulatory signs about 9% of otherwise healthy female patient may present with some spotting around the time ovulation.  Given absence of any focal endometrial abnormalities this is the most likely etiology and should not have a negative impact on her ability  to conceive.  2) Semen analysis - ordered  3) Laboratory evaluation to date - normal other than mildly elevated Prolactin level which in the absence of galactorrhea and regular menstrual cycles is unlikely to be clinically significant.    4) Treatment plan - if semen analysis normal the diagnosis is unexplained infertility.  We discussed a trial of ovulation induction agent such as letrozole.  In the setting of unexplained infertility this has the benefit of being a relatively low cost intervention but fecundity rates do not approach normal for patient with unexplained infertility.  Would plan on 6 cycles and REI consultation if failure to conceive in that time frame.  5) Return if symptoms worsen or fail to improve.   Vena Austria, MD, [facility], [facility] 01/26/2018, 12:44 PM

## 2018-02-15 ENCOUNTER — Telehealth: Payer: Self-pay

## 2018-02-15 ENCOUNTER — Other Ambulatory Visit: Payer: Self-pay | Admitting: Obstetrics and Gynecology

## 2018-02-15 ENCOUNTER — Telehealth: Payer: Self-pay | Admitting: Obstetrics and Gynecology

## 2018-02-15 DIAGNOSIS — N970 Female infertility associated with anovulation: Secondary | ICD-10-CM

## 2018-02-15 MED ORDER — LETROZOLE 2.5 MG PO TABS: 2.5000 mg | ORAL_TABLET | Freq: Every day | ORAL | 0 refills | Status: DC

## 2018-02-15 NOTE — Telephone Encounter (Signed)
-----   Message from Vena AustriaAndreas Staebler, MD sent at 02/15/2018  2:56 PM EDT ----- Regarding: progesterone Progesterone 03/04/18

## 2018-02-15 NOTE — Progress Notes (Signed)
Letrozole Cycle I 2.5mg  LMP 02/12/18 progesterone 03/04/18

## 2018-02-15 NOTE — Telephone Encounter (Signed)
03/04/18 is day 21 but day 22 03/05/18 is fine too

## 2018-02-15 NOTE — Telephone Encounter (Signed)
Called to schedule appointment with patient to schedule lab on 03/04/18. Patient states she was advise to schedule on 03/05/18. Please advise which dates in ok for patient to come in and be seen by lab. Thank you!

## 2018-02-15 NOTE — Telephone Encounter (Signed)
Please call in Rx

## 2018-02-15 NOTE — Telephone Encounter (Signed)
Patient is schedule 03/04/18

## 2018-02-15 NOTE — Telephone Encounter (Signed)
Pt was told to call and request rx of letrozole when on 3d of cycle.  Needs rx sent to [facility] in [place] today.  [phone]

## 2018-03-04 ENCOUNTER — Other Ambulatory Visit: Payer: 59

## 2018-03-04 DIAGNOSIS — N970 Female infertility associated with anovulation: Secondary | ICD-10-CM

## 2018-03-05 LAB — PROGESTERONE: Progesterone: 14.7 ng/mL

## 2018-03-12 ENCOUNTER — Telehealth: Payer: Self-pay

## 2018-03-12 NOTE — Telephone Encounter (Signed)
Pt calling to let AMS know she started her period today. She is needing a refill of the Letrazole called in to [facility]

## 2018-03-15 ENCOUNTER — Other Ambulatory Visit: Payer: Self-pay | Admitting: Obstetrics and Gynecology

## 2018-03-15 MED ORDER — LETROZOLE 2.5 MG PO TABS: 2.5000 mg | ORAL_TABLET | Freq: Every day | ORAL | 0 refills | Status: AC

## 2018-03-15 NOTE — Progress Notes (Unsigned)
LMP 03/12/18 Cycle 2 letrozole 2.5mg 

## 2018-03-26 ENCOUNTER — Telehealth: Payer: Self-pay

## 2018-03-26 NOTE — Telephone Encounter (Signed)
Please advise 

## 2018-03-26 NOTE — Telephone Encounter (Signed)
Pt aware.

## 2018-03-26 NOTE — Telephone Encounter (Signed)
Pt calling wondering If she needs to come in to have labs drawn. She is on Letrozole . Please advise

## 2018-03-26 NOTE — Telephone Encounter (Signed)
No lab draw required this month since we showed that she was ovulatory on this dose last month

## 2018-04-02 ENCOUNTER — Telehealth: Payer: Self-pay | Admitting: Family Medicine

## 2018-04-02 MED ORDER — BECLOMETHASONE DIPROP HFA 80 MCG/ACT IN AERB: 1.0000 | INHALATION_SPRAY | Freq: Two times a day (BID) | RESPIRATORY_TRACT | 11 refills | Status: DC

## 2018-04-02 NOTE — Telephone Encounter (Signed)
I am so happy that she chose to call and ask for the controller medicine Have her start the QVAR, one puff twice a day; rinse mouth out after use to reduce risk of getting thrush If still needing rescue inhaler more than 2x a week after 1 week, then increase to two puffs twice a day and call us

## 2018-04-02 NOTE — Telephone Encounter (Signed)
Copied from CRM [id number]. Topic: Quick Communication - Rx Refill/Question >> Apr 02, 2018  8:50 AM Angela Nevin wrote: Patient is requesting a rescue inhaler- Patient states that she has been using her prescribed albuterol (PROAIR HFA) 4x a day and would like to have QVAR sent to pharmacy if possible.    Preferred Pharmacy (with phone number or street name):[id number] - [place], [place] - [address] [place] [address] AT [facility] OF [address] [place] [address] [phone] (Phone) [phone] (Fax)

## 2018-04-02 NOTE — Telephone Encounter (Signed)
Pt.notified

## 2018-04-09 ENCOUNTER — Telehealth: Payer: Self-pay | Admitting: Obstetrics and Gynecology

## 2018-04-09 ENCOUNTER — Other Ambulatory Visit: Payer: Self-pay | Admitting: Obstetrics and Gynecology

## 2018-04-09 DIAGNOSIS — N970 Female infertility associated with anovulation: Secondary | ICD-10-CM

## 2018-04-09 MED ORDER — LETROZOLE 2.5 MG PO TABS: 2.5000 mg | ORAL_TABLET | Freq: Every day | ORAL | 0 refills | Status: DC

## 2018-04-09 MED ORDER — CLOMIPHENE CITRATE 50 MG PO TABS: 50.0000 mg | ORAL_TABLET | Freq: Every day | ORAL | 0 refills | Status: DC

## 2018-04-09 NOTE — Progress Notes (Signed)
LMP 04/07/18 switch to clomid cycle I 50mg  in lieu of letrozole (patient request) cycle day 21 progesterone 04/27/18

## 2018-04-09 NOTE — Telephone Encounter (Signed)
Please advise 

## 2018-04-09 NOTE — Progress Notes (Signed)
LMP 04/07/18 Cycle 3 Letrozole 2.5mg 

## 2018-04-09 NOTE — Telephone Encounter (Signed)
Patient is requesting to start taking clomid patient is currently on Day 3 on menstrual cycle. Please advise [facility] in [place]. Please advise

## 2018-04-12 ENCOUNTER — Ambulatory Visit: Payer: 59 | Admitting: Nurse Practitioner

## 2018-04-12 ENCOUNTER — Encounter: Payer: Self-pay | Admitting: Nurse Practitioner

## 2018-04-12 ENCOUNTER — Telehealth: Payer: Self-pay | Admitting: Obstetrics and Gynecology

## 2018-04-12 VITALS — BP 108/70 | HR 90 | Temp 98.0°F | Resp 16 | Ht 62.0 in | Wt 128.0 lb

## 2018-04-12 DIAGNOSIS — R062 Wheezing: Secondary | ICD-10-CM

## 2018-04-12 DIAGNOSIS — J45909 Unspecified asthma, uncomplicated: Secondary | ICD-10-CM

## 2018-04-12 DIAGNOSIS — R05 Cough: Secondary | ICD-10-CM | POA: Diagnosis not present

## 2018-04-12 DIAGNOSIS — R059 Cough, unspecified: Secondary | ICD-10-CM

## 2018-04-12 MED ORDER — BECLOMETHASONE DIPROP HFA 80 MCG/ACT IN AERB: 2.0000 | INHALATION_SPRAY | Freq: Two times a day (BID) | RESPIRATORY_TRACT | 11 refills | Status: DC

## 2018-04-12 NOTE — Progress Notes (Signed)
Name: Mary Kramer   MRN: [id number]    DOB: April 01, 1986   Date:04/12/2018       Progress Note  Subjective  Chief Complaint  Chief Complaint  Patient presents with  . Asthma  . Cough    HPI   Asthma Had childhood asthma, states last few years used albuterol inhaler maybe once a year if she had a bad URI. 2 weeks ago started on maintenance medication due to increase wheezing and cough. She started with qvar 1 puff BID, states then went up to taking Qvar 2 puffs BID and albuterol 4 puffs every 4-6 hours for about a week feeling better now states yesterday only needed the rescue inhaler once and went down to qvar 1 puff twice a day the last few days. States when she doesn't take qvar has lots of mucous and chest pressure when she doesn't take it. Has never had an exacerbation before and never had PFTs  Unsure of what triggered this but noted symptoms started shortly after she started infertility treatment- letrozole on 10/21 for  5 days course.  Symptoms happen   daily Nighttime awakenings 0 Rescue inhaler Use daily Interference with normal activities minor Exacerbations requiring systemic corticosteroids 0-1   No fevers, chills, shortness of breath, sinus pressure, rhinorrhea, sore throat.   Patient Active Problem List   Diagnosis Date Noted  . Controlled substance agreement signed 04/28/2017  . Infertility, female 09/26/2016  . Insomnia 08/10/2015  . Vitamin D deficiency 08/10/2015  . Migraine without aura 05/11/2015  . ADHD (attention deficit hyperactivity disorder) 02/09/2015  . Hypoglycemia 02/09/2015  . Lump or mass in breast 11/15/2012  . Discharge of breast 11/15/2012    Past Medical History:  Diagnosis Date  . ADD (attention deficit disorder)   . ADHD (attention deficit hyperactivity disorder)   . Vitamin D deficiency     Past Surgical History:  Procedure Laterality Date  . CERVICAL BIOPSY  W/ LOOP ELECTRODE EXCISION    . DG HYSTEROGRAM (HSG)  07/30/2017         Social History   Tobacco Use  . Smoking status: Never Smoker  . Smokeless tobacco: Never Used  Substance Use Topics  . Alcohol use: Not Currently    Comment: rarely     Current Outpatient Medications:  .  albuterol (PROAIR HFA) 108 (90 Base) MCG/ACT inhaler, Inhale 2 puffs into the lungs every 4 (four) hours as needed for wheezing or shortness of breath., Disp: 1 Inhaler, Rfl: 1 .  beclomethasone (QVAR REDIHALER) 80 MCG/ACT inhaler, Inhale 2 puffs into the lungs 2 (two) times daily. (rinse mouth after use), Disp: 10.6 g, Rfl: 11 .  clomiPHENE (CLOMID) 50 MG tablet, Take 1 tablet (50 mg total) by mouth daily. (Patient not taking: Reported on 04/12/2018), Disp: 5 tablet, Rfl: 0 .  dexmethylphenidate (FOCALIN XR) 10 MG 24 hr capsule, Take 1 capsule (10 mg total) by mouth daily., Disp: 30 capsule, Rfl: 0  Allergies  Allergen Reactions  . Lexapro [Escitalopram Oxalate] Nausea Only  . Hydrocodone Rash  . Vicodin [Hydrocodone-Acetaminophen] Rash    ROS   No other specific complaints in a complete review of systems (except as listed in HPI above).  Objective  Vitals:   04/12/18 1526  BP: 108/70  Pulse: 90  Resp: 16  Temp: 98 F (36.7 C)  TempSrc: Oral  SpO2: 97%  Weight: 128 lb (58.1 kg)  Height: 5\' 2"  (1.575 m)     Body mass index is 23.41 kg/m.  Nursing Note and Vital Signs reviewed.  Physical Exam  Constitutional: She is oriented to person, place, and time. She appears well-developed and well-nourished. She is cooperative.  HENT:  Head: Normocephalic and atraumatic.  Right Ear: Hearing normal.  Left Ear: Hearing normal.  Nose: Nose normal. Right sinus exhibits no maxillary sinus tenderness and no frontal sinus tenderness. Left sinus exhibits no maxillary sinus tenderness and no frontal sinus tenderness.  Mouth/Throat: Uvula is midline, oropharynx is clear and moist and mucous membranes are normal.  Eyes: Pupils are equal, round, and reactive to light.  Conjunctivae are normal.  Cardiovascular: Normal rate, regular rhythm and normal heart sounds.  Pulmonary/Chest: Effort normal and breath sounds normal. No respiratory distress. She has no wheezes.  Abdominal: Normal appearance.  Musculoskeletal: Normal range of motion.  Neurological: She is alert and oriented to person, place, and time.  Psychiatric: She has a normal mood and affect. Her speech is normal and behavior is normal. Judgment and thought content normal.     No results found for this or any previous visit (from the past 48 hour(s)).  Assessment & Plan  1. Cough - Pulmonary Function Test [facility] Only; Future  2. Wheezing - Pulmonary Function Test [facility] Only; Future  3. Reactive airway disease with wheezing without complication, unspecified asthma severity, unspecified whether persistent - Pulmonary Function Test [facility] Only; Future - beclomethasone (QVAR REDIHALER) 80 MCG/ACT inhaler; Inhale 2 puffs into the lungs 2 (two) times daily. (rinse mouth after use)  Dispense: 10.6 g; Refill: 11  -Please take Qvar 2 puffs twice a day; if needing albuterol inhaler more than 2 puffs daily over the next week please let us know. - Ordered pulmonary function test (PFTs) to get a baseline on your asthma - If unimproved, or increased restriction on PFTs we may refer you to a pulmonologist or change your medications.

## 2018-04-12 NOTE — Patient Instructions (Addendum)
-Please take Qvar 2 puffs twice a day; if needing albuterol inhaler more than 2 puffs daily over the next week please let us know. - Ordered pulmonary function test (PFTs) to get a baseline on your asthma - If unimproved, or increased restriction on PFTs we may refer you to a pulmonologist or change your medications.    Asthma Attack Prevention, Adult Although you may not be able to control the fact that you have asthma, you can take actions to prevent episodes of asthma (asthma attacks). These actions include:  Creating a written plan for managing and treating your asthma attacks (asthma action plan).  Monitoring your asthma.  Avoiding things that can irritate your airways or make your asthma symptoms worse (asthma triggers).  Taking your medicines as directed.  Acting quickly if you have signs or symptoms of an asthma attack.  What are some ways to prevent an asthma attack? Create a plan Work with your health care provider to create an asthma action plan. This plan should include:  A list of your asthma triggers and how to avoid them.  A list of symptoms that you experience during an asthma attack.  Information about when to take medicine and how much medicine to take.  Information to help you understand your peak flow measurements.  Contact information for your health care providers.  Daily actions that you can take to control asthma.  Monitor your asthma  To monitor your asthma:  Use your peak flow meter every morning and every evening for 2-3 weeks. Record the results in a journal. A drop in your peak flow numbers on one or more days may mean that you are starting to have an asthma attack, even if you are not having symptoms.  When you have asthma symptoms, write them down in a journal.  Avoid asthma triggers  Work with your health care provider to find out what your asthma triggers are. This can be done by:  Being tested for allergies.  Keeping a journal that  notes when asthma attacks occur and what may have contributed to them.  Asking your health care provider whether other medical conditions make your asthma worse.  Common asthma triggers include:  Dust.  Smoke. This includes campfire smoke and secondhand smoke from tobacco products.  Pet dander.  Trees, grasses or pollens.  Very cold, dry, or humid air.  Mold.  Foods that contain high amounts of sulfites.  Strong smells.  Engine exhaust and air pollution.  Aerosol sprays and fumes from household cleaners.  Household pests and their droppings, including dust mites and cockroaches.  Certain medicines, including NSAIDs.  Once you have determined your asthma triggers, take steps to avoid them. Depending on your triggers, you may be able to reduce the chance of an asthma attack by:  Keeping your home clean. Have someone dust and vacuum your home for you 1 or 2 times a week. If possible, have them use a high-efficiency particulate arrestance (HEPA) vacuum.  Washing your sheets weekly in hot water.  Using allergy-proof mattress covers and casings on your bed.  Keeping pets out of your home.  Taking care of mold and water problems in your home.  Avoiding areas where people smoke.  Avoiding using strong perfumes or odor sprays.  Avoid spending a lot of time outdoors when pollen counts are high and on very windy days.  Talking with your health care provider before stopping or starting any new medicines.  Medicines Take over-the-counter and prescription medicines only  as told by your health care provider. Many asthma attacks can be prevented by carefully following your medicine schedule. Taking your medicines correctly is especially important when you cannot avoid certain asthma triggers. Even if you are doing well, do not stop taking your medicine and do not take less medicine. Act quickly If an asthma attack happens, acting quickly can decrease how severe it is and how long  it lasts. Take these actions:  Pay attention to your symptoms. If you are coughing, wheezing, or having difficulty breathing, do not wait to see if your symptoms go away on their own. Follow your asthma action plan.  If you have followed your asthma action plan and your symptoms are not improving, call your health care provider or seek immediate medical care at the nearest hospital.  It is important to write down how often you need to use your fast-acting rescue inhaler. You can track how often you use an inhaler in your journal. If you are using your rescue inhaler more often, it may mean that your asthma is not under control. Adjusting your asthma treatment plan may help you to prevent future asthma attacks and help you to gain better control of your condition. How can I prevent an asthma attack when I exercise?  Exercise is a common asthma trigger. To prevent asthma attacks during exercise:  Follow advice from your health care provider about whether you should use your fast-acting inhaler before exercising. Many people with asthma experience exercise-induced bronchoconstriction (EIB). This condition often worsens during vigorous exercise in cold, humid, or dry environments. Usually, people with EIB can stay very active by using a fast-acting inhaler before exercising.  Avoid exercising outdoors in very cold or humid weather.  Avoid exercising outdoors when pollen counts are high.  Warm up and cool down when exercising.  Stop exercising right away if asthma symptoms start.  Consider taking part in exercises that are less likely to cause asthma symptoms such as:  Indoor swimming.  Biking.  Walking.  Hiking.  Playing football.  This information is not intended to replace advice given to you by your health care provider. Make sure you discuss any questions you have with your health care provider. Document Released: 04/30/2009 Document Revised: 01/11/2016 Document Reviewed:  10/27/2015 Elsevier Interactive Patient Education  [employer].

## 2018-04-12 NOTE — Telephone Encounter (Signed)
-----   Message from Vena AustriaAndreas Staebler, MD sent at 04/12/2018  8:54 AM EST ----- Regarding: Progesterone This was the progesterone for december

## 2018-04-12 NOTE — Telephone Encounter (Signed)
Called to schedule patient for appointment. Patient states she was sick and unable to take prescription when she needed to and that she will call back to reschedule

## 2018-06-10 ENCOUNTER — Telehealth: Payer: Self-pay

## 2018-06-10 NOTE — Telephone Encounter (Signed)
advise

## 2018-06-10 NOTE — Telephone Encounter (Signed)
A couple of minutes before pt's call she had a sharp left side pain; went to [facility] and it looked like she was on her period; she is mid cycle right now; Pain is 4/10; lower left back pain as well.  What to do?  [phone]

## 2018-06-11 ENCOUNTER — Encounter: Payer: Self-pay | Admitting: Obstetrics and Gynecology

## 2018-06-11 ENCOUNTER — Ambulatory Visit (INDEPENDENT_AMBULATORY_CARE_PROVIDER_SITE_OTHER): Payer: 59 | Admitting: Obstetrics and Gynecology

## 2018-06-11 ENCOUNTER — Ambulatory Visit (INDEPENDENT_AMBULATORY_CARE_PROVIDER_SITE_OTHER): Payer: 59

## 2018-06-11 VITALS — BP 112/70 | HR 92 | Ht 61.5 in | Wt 129.0 lb

## 2018-06-11 DIAGNOSIS — N83292 Other ovarian cyst, left side: Secondary | ICD-10-CM

## 2018-06-11 DIAGNOSIS — N83202 Unspecified ovarian cyst, left side: Secondary | ICD-10-CM | POA: Diagnosis not present

## 2018-06-11 DIAGNOSIS — R102 Pelvic and perineal pain: Secondary | ICD-10-CM

## 2018-06-11 NOTE — Telephone Encounter (Signed)
Pt states she doesn't think that is the problem. She has made an appointment for today at 10:30

## 2018-06-11 NOTE — Telephone Encounter (Signed)
Its probably secondary to ovulation

## 2018-06-11 NOTE — Telephone Encounter (Signed)
Pt also called after hour nurse 06/10/2018 7:14pm; trying to conceive; had period 1.5wks ago; not sure if an ovarian cyst popped; left side pain; voiding small amt bright red blood. [phone]  After hour nurse adv her to be seen within 3-4 hrs.  Pt told after hour nurse she would go to ED if pregnancy test was positive.  Pt has appt today c AMS.

## 2018-06-11 NOTE — Progress Notes (Signed)
Gynecology Pelvic Pain Evaluation   Chief Complaint:  Chief Complaint  Patient presents with  . left side pain    possible ovarian cyst/ blood in toilet    History of Present Illness:   Patient is a 33 y.o. G1P0010 who LMP was Patient's last menstrual period was 06/04/2018 (exact date)., presents today for a problem visit.  She complains of abnormal bleeding and pain.   Her pain is localized to the LLQ area, described as sharp and stabbing, began yesterday and its severity is described as moderate. The pain radiates to the  Non-radiating. She has these associated symptoms which include midcycle bleeding. Patient has these modifiers which include bowel movement that make it better and movement that make it worse.  Context includes: spontaneous.  She has not been on letrozole or any ovulation induction agents this cycle (last in November)  Previous evaluation: none. Prior Diagnosis: none. Previous Treatment: none.  Review of Systems: ROS  Past Medical History:  Past Medical History:  Diagnosis Date  . ADD (attention deficit disorder)   . ADHD (attention deficit hyperactivity disorder)   . Vitamin D deficiency     Past Surgical History:  Past Surgical History:  Procedure Laterality Date  . CERVICAL BIOPSY  W/ LOOP ELECTRODE EXCISION    . DG HYSTEROGRAM (HSG)  07/30/2017        Gynecologic History:  Patient's last menstrual period was 06/04/2018 (exact date).   Obstetric History: G1P0010  Family History:  Family History  Problem Relation Age of Onset  . Anxiety disorder Mother   . Migraines Mother   . Hyperlipidemia Father   . Transient ischemic attack Father   . Hypertension Father   . Stroke Father   . Lung disease Father   . Atrial fibrillation Maternal Grandmother   . Cancer Paternal Grandmother        leukemia  . Diabetes Paternal Grandfather   . Cerebral aneurysm Paternal Aunt   . Cerebral aneurysm Maternal Grandfather     Social History:  Social  History   Socioeconomic History  . Marital status: Single    Spouse name: Not on file  . Number of children: Not on file  . Years of education: Not on file  . Highest education level: Not on file  Occupational History  . Not on file  Social Needs  . Financial resource strain: Not on file  . Food insecurity:    Worry: Not on file    Inability: Not on file  . Transportation needs:    Medical: Not on file    Non-medical: Not on file  Tobacco Use  . Smoking status: Never Smoker  . Smokeless tobacco: Never Used  Substance and Sexual Activity  . Alcohol use: Not Currently    Comment: rarely  . Drug use: No  . Sexual activity: Yes    Birth control/protection: Pill  Lifestyle  . Physical activity:    Days per week: 1 day    Minutes per session: 30 min  . Stress: Not at all  Relationships  . Social connections:    Talks on phone: More than three times a week    Gets together: More than three times a week    Attends religious service: More than 4 times per year    Active member of club or organization: No    Attends meetings of clubs or organizations: Never    Relationship status: Married  . Intimate partner violence:    Fear of  current or ex partner: No    Emotionally abused: No    Physically abused: No    Forced sexual activity: No  Other Topics Concern  . Not on file  Social History Narrative  . Not on file    Allergies:  Allergies  Allergen Reactions  . Lexapro [Escitalopram Oxalate] Nausea Only  . Hydrocodone Rash  . Vicodin [Hydrocodone-Acetaminophen] Rash    Medications: Prior to Admission medications   Medication Sig Start Date End Date Taking? Authorizing Provider  albuterol (PROAIR HFA) 108 (90 Base) MCG/ACT inhaler Inhale 2 puffs into the lungs every 4 (four) hours as needed for wheezing or shortness of breath. 04/28/17  Yes Lada, Janit BernMelinda P, MD  beclomethasone (QVAR REDIHALER) 80 MCG/ACT inhaler Inhale 2 puffs into the lungs 2 (two) times daily. (rinse  mouth after use) 04/12/18  Yes Poulose, Percell BeltElizabeth E, NP  dexmethylphenidate (FOCALIN XR) 10 MG 24 hr capsule Take 1 capsule (10 mg total) by mouth daily. 01/01/18  Yes Lada, Janit BernMelinda P, MD  clomiPHENE (CLOMID) 50 MG tablet Take 1 tablet (50 mg total) by mouth daily. Patient not taking: Reported on 04/12/2018 04/09/18   Vena AustriaStaebler, Lilliemae Fruge, MD    Physical Exam Vitals: Blood pressure 112/70, pulse 92, height 5' 1.5" (1.562 m), weight 129 lb (58.5 kg), last menstrual period 06/04/2018.  General: NAD HEENT: normocephalic, anicteric Pulmonary: No increased work of breathing  Genitourinary:  External: Normal external female genitalia.  Normal urethral meatus, normal Bartholin's and Skene's glands.    Vagina: Normal vaginal mucosa, no evidence of prolapse.    Cervix: Grossly normal in appearance, no bleeding  Uterus: Non-enlarged, mobile, normal contour.  No CMT  Adnexa: ovaries non-enlarged, no adnexal masses  Rectal: deferred  Lymphatic: no evidence of inguinal lymphadenopathy Extremities: no edema, erythema, or tenderness Neurologic: Grossly intact Psychiatric: mood appropriate, affect full  Female chaperone present for pelvic portion of the physical exam  [place] Transvaginal Non-ob  Result Date: 06/11/2018 Patient Name: Mary Kramer DOB: 04-30-1986 MRN: [id number] ULTRASOUND REPORT Location: [facility] Date of Service: 06/11/2018 Indications:Pelvic Pain Findings: The uterus is anteverted and measures 8.3 x 4.6 x 4.0cm. Echo texture is homogenous without evidence of focal masses. The Endometrium measures 12.8 mm. Right Ovary measures 3.2 x 2.3 x 2.0cm. It is normal in appearance. Left Ovary measures 3.4 x 2.9 x 2.0cm with complex collapsed cyst measuring 2.0cm. Survey of the adnexa demonstrates no adnexal masses. Small amount of fluid within cul de sac and near bilateral ovaries. Impression: 1. Complex collapsed left ovarian cyst, otherwise normal gyn ultrasound.  Recommendations: 1.Clinical  correlation with the patient's History and Physical Exam. Darlina GuysAbby M Clarke, RDMS RVT Ultrasound demonstrates a  Ruptured simple left ovarian cyst.  There is no evidence of hemorraghic component.  Minimal amount of free fluid noted in pelvis.  No specific imaging follow up is recommended according to the Society of Radiologists in Ultrasound 2010 Consensus Conference Statement for simple cysts <3cm incidentally imaged in a reproductive age woman.  Grier Rocher(D Levine et al. Management of Asymptomatic Ovarian and Other Adnexal Cysts Imaged at US: Society of Radiologists in Ultrasound Consensus Conference Statement 2010. Radiology 256 (Sept 2010): 943-954.). Vena AustriaAndreas Reford Olliff, MD, Evern CoreFACOG [facility], [facility] 06/11/2018, 12:55 PM     Assessment: 33 y.o. G1P0010 with ruptured functional ovarian cyst.  Problem List Items Addressed This Visit    None    Visit Diagnoses    Pelvic pain    -  Primary   Relevant Orders  US Transvaginal Non-OB (Completed)       1) We discussed the possible etiologies for pelvic pain in women.  Gynecologic causes may include endometriosis, adenomyosis, pelvic inflammatory disease (PID), ovarian cysts, ovarian or tubal torsion, and in rare case gynecologic malignancy such as cervical, uterine, or ovarian cancer.  In addition thee possibility of non-gynecologic etiologies such as urinary or GI tract pathology or disordered, as well as musculoskeletal problems.  The goal is to complete a basic work up in hopes of identifying the underlying cause which in turn will dictate treatment.  In the meantime supportive measures such as localized heat, and NSAIDs are reasonable first steps.   - ultrasound today reveals a likely ruptured left ovarian cyst which correlates with the patient symptoms. - based on size no risk of torsion, no need for follow up imaging - no etiology of midcycle bleeding noted but potentially secondary to anovulatory cycle.   - If menses in the next two  week start clomid, if failure to have spontaneous menses then 10 day provera course  2) Return for Today at noon TVUS pelvic pain.   Vena Austria, MD, [facility], [facility]

## 2018-06-18 ENCOUNTER — Telehealth: Payer: Self-pay

## 2018-06-18 NOTE — Telephone Encounter (Signed)
Pt needs notes faxed to [facility] at [phone] attention Armandina Stammer.  Pt has appt 07/07/2018 at 9am.  If she needs to fill out forms or anything, just call.  [phone] Pt made appt herself.  Adv she needs to fill out ROI either in office or on our web site.

## 2018-07-07 DIAGNOSIS — N979 Female infertility, unspecified: Secondary | ICD-10-CM | POA: Diagnosis not present

## 2018-07-23 DIAGNOSIS — J301 Allergic rhinitis due to pollen: Secondary | ICD-10-CM | POA: Diagnosis not present

## 2018-07-24 IMAGING — RF DG HYSTEROGRAM
1 series · 1 of 1 positions shown · IV contrast (omnipaque)
Comparison: None in PACs

CLINICAL DATA: Infertility

EXAM:
HYSTEROSALPINGOGRAM
TECHNIQUE: Following cleansing of the cervix and vagina with Betadine solution,
a hysterosalpingogram was performed using a 5-French
hysterosalpingogram catheter and Omnipaque 300 contrast. The patient
tolerated the examination without difficulty.

[Series 1: cp_standard · 0.17mm/px · 1 of 1 slices shown]
[im 1/1]
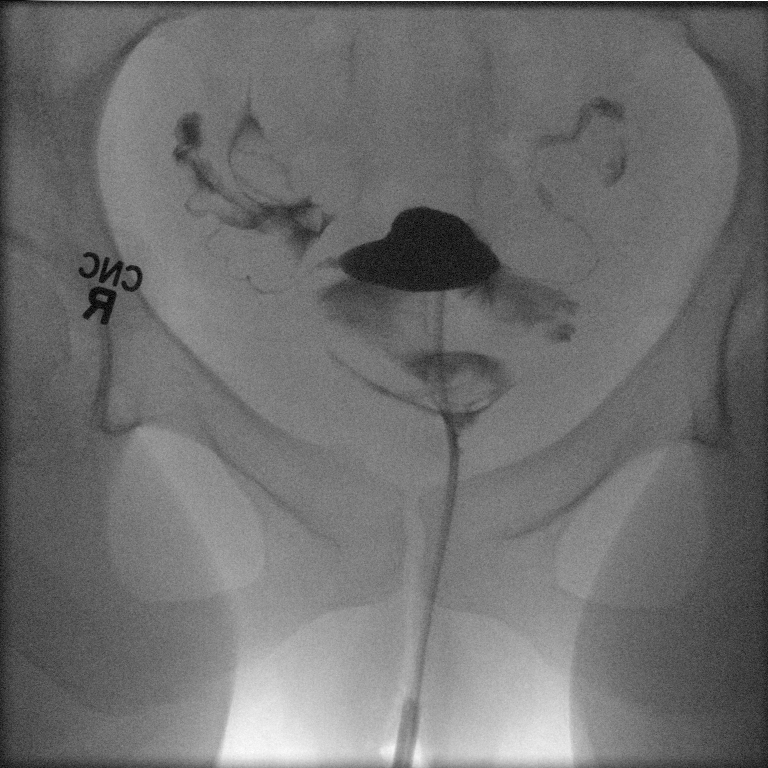

[1 of 1 positions shown; findings below may reference images not displayed]

FLUOROSCOPY TIME:  Radiation Exposure Index (as provided by the
fluoroscopic device): 12.4 mGy

If the device does not provide the exposure index:

Fluoroscopy Time:  1 minutes

Number of Acquired Images:  1 screen save
FINDINGS: The anticipated procedure with attendant risks and benefits were
discussed with the patient by the performing gynecologist, Dr.
Naveen. The cervical canal was cannulated by Dr. Naveen.
Subsequently under fluoroscopic observation Isovue 370 was instilled
into the uterine cavity. The uterine contour was normal. There was
initial delay in filling of the fallopian tubes but subsequently
they both filled and exhibited free spillage. The fallopian tubes
exhibit normal caliber.
IMPRESSION: Normal HSG with patency of the fallopian tubes.

## 2018-07-30 ENCOUNTER — Ambulatory Visit: Payer: 59 | Admitting: Family Medicine

## 2018-08-13 ENCOUNTER — Encounter: Payer: Self-pay | Admitting: Family Medicine

## 2018-08-13 ENCOUNTER — Other Ambulatory Visit: Payer: Self-pay

## 2018-08-13 ENCOUNTER — Ambulatory Visit (INDEPENDENT_AMBULATORY_CARE_PROVIDER_SITE_OTHER): Payer: 59 | Admitting: Family Medicine

## 2018-08-13 ENCOUNTER — Ambulatory Visit: Payer: 59 | Admitting: Family Medicine

## 2018-08-13 VITALS — BP 112/62 | HR 98 | Temp 98.3°F | Resp 12 | Ht 62.0 in | Wt 127.1 lb

## 2018-08-13 DIAGNOSIS — F909 Attention-deficit hyperactivity disorder, unspecified type: Secondary | ICD-10-CM | POA: Diagnosis not present

## 2018-08-13 MED ORDER — AMPHETAMINE-DEXTROAMPHET ER 25 MG PO CP24: 25.0000 mg | ORAL_CAPSULE | ORAL | 0 refills | Status: DC

## 2018-08-13 MED ORDER — AMPHETAMINE-DEXTROAMPHETAMINE 10 MG PO TABS: 10.0000 mg | ORAL_TABLET | Freq: Every day | ORAL | 0 refills | Status: DC

## 2018-08-13 NOTE — Progress Notes (Signed)
Name: Mary Kramer   MRN: [id number]    DOB: 1985-08-16   Date:08/13/2018       Progress Note  Subjective  Chief Complaint  Chief Complaint  Patient presents with  . Medication Refill  . ADHD    HPI  ADHD: she was seen in August 2019 by Dr. Sherie Don and was given Focalin XR but never started she had some Adderal XR at home and has been taking on and off since. She was afraid to change medication since she had side effects with Vyvanse. She works at [employer] as a Engineer, civil (consulting). She is able to focus and stays more organized on medication, no side effects. She is seeing fertility sub-specialist at [facility] and is aware to not take medication when or if she goes for IUI .    Patient Active Problem List   Diagnosis Date Noted  . Controlled substance agreement signed 04/28/2017  . Infertility, female 09/26/2016  . Insomnia 08/10/2015  . Vitamin D deficiency 08/10/2015  . Migraine without aura 05/11/2015  . ADHD (attention deficit hyperactivity disorder) 02/09/2015  . Hypoglycemia 02/09/2015  . Lump or mass in breast 11/15/2012  . Discharge of breast 11/15/2012    Past Surgical History:  Procedure Laterality Date  . CERVICAL BIOPSY  W/ LOOP ELECTRODE EXCISION    . DG HYSTEROGRAM (HSG)  07/30/2017        Family History  Problem Relation Age of Onset  . Anxiety disorder Mother   . Migraines Mother   . Hyperlipidemia Father   . Transient ischemic attack Father   . Hypertension Father   . Stroke Father   . Lung disease Father   . Atrial fibrillation Maternal Grandmother   . Cancer Paternal Grandmother        leukemia  . Diabetes Paternal Grandfather   . Cerebral aneurysm Paternal Aunt   . Cerebral aneurysm Maternal Grandfather     Social History   Socioeconomic History  . Marital status: Single    Spouse name: Not on file  . Number of children: Not on file  . Years of education: Not on file  . Highest education level: Not on file  Occupational History  . Not on file   Social Needs  . Financial resource strain: Not on file  . Food insecurity:    Worry: Not on file    Inability: Not on file  . Transportation needs:    Medical: Not on file    Non-medical: Not on file  Tobacco Use  . Smoking status: Never Smoker  . Smokeless tobacco: Never Used  Substance and Sexual Activity  . Alcohol use: Not Currently    Comment: rarely  . Drug use: No  . Sexual activity: Yes    Birth control/protection: Pill  Lifestyle  . Physical activity:    Days per week: 1 day    Minutes per session: 30 min  . Stress: Not at all  Relationships  . Social connections:    Talks on phone: More than three times a week    Gets together: More than three times a week    Attends religious service: More than 4 times per year    Active member of club or organization: No    Attends meetings of clubs or organizations: Never    Relationship status: Married  . Intimate partner violence:    Fear of current or ex partner: No    Emotionally abused: No    Physically abused: No  Forced sexual activity: No  Other Topics Concern  . Not on file  Social History Narrative  . Not on file     Current Outpatient Medications:  .  albuterol (PROAIR HFA) 108 (90 Base) MCG/ACT inhaler, Inhale 2 puffs into the lungs every 4 (four) hours as needed for wheezing or shortness of breath., Disp: 1 Inhaler, Rfl: 1 .  beclomethasone (QVAR REDIHALER) 80 MCG/ACT inhaler, Inhale 2 puffs into the lungs 2 (two) times daily. (rinse mouth after use), Disp: 10.6 g, Rfl: 11 .  clomiPHENE (CLOMID) 50 MG tablet, Take 1 tablet (50 mg total) by mouth daily. (Patient not taking: Reported on 04/12/2018), Disp: 5 tablet, Rfl: 0 .  dexmethylphenidate (FOCALIN XR) 10 MG 24 hr capsule, Take 1 capsule (10 mg total) by mouth daily. (Patient not taking: Reported on 08/13/2018), Disp: 30 capsule, Rfl: 0  Allergies  Allergen Reactions  . Lexapro [Escitalopram Oxalate] Nausea Only  . Hydrocodone Rash  . Vicodin  [Hydrocodone-Acetaminophen] Rash    I personally reviewed active problem list, medication list, allergies, family history, social history with the patient/caregiver today.   ROS  Constitutional: Negative for fever or weight change.  Respiratory: Negative for cough and shortness of breath.   Cardiovascular: Negative for chest pain or palpitations.  Gastrointestinal: Negative for abdominal pain, no bowel changes.  Musculoskeletal: Negative for gait problem or joint swelling.  Skin: Negative for rash.  Neurological: Negative for dizziness or headache.  No other specific complaints in a complete review of systems (except as listed in HPI above).  Objective  Vitals:   08/13/18 1335  BP: 112/62  Pulse: 98  Resp: 12  Temp: 98.3 F (36.8 C)  TempSrc: Oral  SpO2: 99%  Weight: 127 lb 1.6 oz (57.7 kg)  Height:  (1.575 m)    Body mass index is 23.25 kg/m.  Physical Exam  Constitutional: Patient appears well-developed and well-nourished. No distress.  HEENT: head atraumatic, normocephalic, pupils equal and reactive to light,  neck supple, throat within normal limits Cardiovascular: Normal rate, regular rhythm and normal heart sounds.  No murmur heard. No BLE edema. Pulmonary/Chest: Effort normal and breath sounds normal. No respiratory distress. Abdominal: Soft.  There is no tenderness. Psychiatric: Patient has a normal mood and affect. behavior is normal. Judgment and thought content normal.  PHQ2/9: Depression screen [facility] 2/9 08/13/2018 01/01/2018 07/28/2017 04/28/2017 08/22/2016  Decreased Interest 0 0 0 0 0  Down, Depressed, Hopeless 0 0 0 0 0  PHQ - 2 Score 0 0 0 0 0  Altered sleeping 0 - - - -  Tired, decreased energy 0 - - - -  Change in appetite 0 - - - -  Feeling bad or failure about yourself  0 - - - -  Trouble concentrating 0 - - - -  Moving slowly or fidgety/restless 0 - - - -  Suicidal thoughts 0 - - - -  PHQ-9 Score 0 - - - -  Difficult doing work/chores Not  difficult at all - - - -     Fall Risk: Fall Risk  08/13/2018 04/12/2018 01/01/2018 07/28/2017 04/28/2017  Falls in the past year? 0 0 No No No  Number falls in past yr: 0 - - - -  Injury with Fall? 0 - - - -    Functional Status Survey: Is the patient deaf or have difficulty hearing?: No Does the patient have difficulty seeing, even when wearing glasses/contacts?: No Does the patient have difficulty concentrating, remembering, or making  decisions?: No Does the patient have difficulty walking or climbing stairs?: No Does the patient have difficulty dressing or bathing?: No Does the patient have difficulty doing errands alone such as visiting a doctor's office or shopping?: No   Assessment & Plan  1. Attention deficit hyperactivity disorder (ADHD), unspecified ADHD type   She asked for 90 days because of COVID19 , explained that if she loses medication she will not be able to fill it until the 3 months expires. Also discussed importance of stopping medication when trying to get pregnant and during pregnancy because of risk of pre-term delivery and low birth weight.  - amphetamine-dextroamphetamine (ADDERALL XR) 25 MG 24 hr capsule; Take 1 capsule by mouth every morning.  Dispense: 90 capsule; Refill: 0 - amphetamine-dextroamphetamine (ADDERALL) 10 MG tablet; Take 1 tablet (10 mg total) by mouth daily. Around 2 pm  Dispense: 90 tablet; Refill: 0

## 2018-10-07 ENCOUNTER — Other Ambulatory Visit: Payer: Self-pay | Admitting: Family Medicine

## 2018-10-07 DIAGNOSIS — F909 Attention-deficit hyperactivity disorder, unspecified type: Secondary | ICD-10-CM

## 2018-10-07 NOTE — Telephone Encounter (Signed)
Copied from CRM [id number]. Topic: Quick Communication - Rx Refill/Question >> Oct 07, 2018  1:35 PM Jens Som A wrote: Medication: amphetamine-dextroamphetamine (ADDERALL) 10 MG tablet [[id number]] , amphetamine-dextroamphetamine (ADDERALL XR) 25 MG 24 hr capsule [[id number]]   Has the patient contacted their pharmacy? Yes  (Agent: If no, request that the patient contact the pharmacy for the refill.) (Agent: If yes, when and what did the pharmacy advise?)  Preferred Pharmacy (with phone number or street name): [id number] - [place], [place] - [address] [place] [address] AT [facility] OF [address] [place] [address] [phone] (Phone) [phone] (Fax)     Agent: Please be advised that RX refills may take up to 3 business days. We ask that you follow-up with your pharmacy.

## 2018-10-11 NOTE — Telephone Encounter (Signed)
Called patient states she only received 30 day supply for both due to ins.  I also called and confirmed that with her pharmacy which is true.

## 2018-10-12 MED ORDER — AMPHETAMINE-DEXTROAMPHETAMINE 10 MG PO TABS: 10.0000 mg | ORAL_TABLET | Freq: Every day | ORAL | 0 refills | Status: DC

## 2018-10-12 MED ORDER — AMPHETAMINE-DEXTROAMPHET ER 25 MG PO CP24: 25.0000 mg | ORAL_CAPSULE | ORAL | 0 refills | Status: DC

## 2018-11-05 ENCOUNTER — Other Ambulatory Visit: Payer: Self-pay

## 2018-11-05 ENCOUNTER — Ambulatory Visit (INDEPENDENT_AMBULATORY_CARE_PROVIDER_SITE_OTHER): Payer: 59

## 2018-11-05 DIAGNOSIS — Z23 Encounter for immunization: Secondary | ICD-10-CM

## 2018-11-05 NOTE — Progress Notes (Signed)
Patient stated that she showed no immunity to Rubella based upon testing ordered by her fertility doctor.  She came in today to get the MMR and was previously told that she must abstain for 1 month after vaccination then she will be able to move forward with superovulation with IUI.  Patient tolerated the SQ injection well and was given documentation of vaccine & VIS for her records. 

## 2019-01-07 ENCOUNTER — Ambulatory Visit (INDEPENDENT_AMBULATORY_CARE_PROVIDER_SITE_OTHER): Payer: 59 | Admitting: Nurse Practitioner

## 2019-01-07 ENCOUNTER — Encounter: Payer: Self-pay | Admitting: Nurse Practitioner

## 2019-01-07 DIAGNOSIS — Z8709 Personal history of other diseases of the respiratory system: Secondary | ICD-10-CM

## 2019-01-07 DIAGNOSIS — Z79899 Other long term (current) drug therapy: Secondary | ICD-10-CM | POA: Diagnosis not present

## 2019-01-07 DIAGNOSIS — J4520 Mild intermittent asthma, uncomplicated: Secondary | ICD-10-CM | POA: Insufficient documentation

## 2019-01-07 DIAGNOSIS — F909 Attention-deficit hyperactivity disorder, unspecified type: Secondary | ICD-10-CM | POA: Diagnosis not present

## 2019-01-07 MED ORDER — AMPHETAMINE-DEXTROAMPHETAMINE 10 MG PO TABS: 10.0000 mg | ORAL_TABLET | Freq: Every day | ORAL | 0 refills | Status: DC

## 2019-01-07 MED ORDER — AMPHETAMINE-DEXTROAMPHET ER 25 MG PO CP24: 25.0000 mg | ORAL_CAPSULE | ORAL | 0 refills | Status: DC

## 2019-01-07 NOTE — Progress Notes (Signed)
Virtual Visit via Telephone Note  I connected with Mary Kramer on 01/07/19 at  4:00 PM EDT by telephone and verified that I am speaking with the correct person using two identifiers.   Staff discussed the limitations of evaluation and management by telemedicine and the availability of in person appointments. The patient expressed understanding and agreed to proceed.  Patient location: work My location: work office Other people present: none HPI  ADHD She was seen in August 2019 by Dr. Sanda Klein and was given Focalin XR but never started it because she had side effects with Vyvanse. She had some Adderal XR at home and has been taking on and off and it worked well for her. In March 2020 she was started on adderall XR 25mg  daily and adderall 10g to take in the afternoons when dose was wearing off- no issues with current dosage. She works at [employer] as a Marine scientist. She is able to focus and stays more organized on medication, no side effects.  PHQ2/9: Depression screen [facility] 2/9 01/07/2019 08/13/2018 01/01/2018 07/28/2017 04/28/2017  Decreased Interest 0 0 0 0 0  Down, Depressed, Hopeless 0 0 0 0 0  PHQ - 2 Score 0 0 0 0 0  Altered sleeping 0 0 - - -  Tired, decreased energy 0 0 - - -  Change in appetite 0 0 - - -  Feeling bad or failure about yourself  0 0 - - -  Trouble concentrating 0 0 - - -  Moving slowly or fidgety/restless 0 0 - - -  Suicidal thoughts 0 0 - - -  PHQ-9 Score 0 0 - - -  Difficult doing work/chores Not difficult at all Not difficult at all - - -     PHQ reviewed. Negative  Patient Active Problem List   Diagnosis Date Noted  . Controlled substance agreement signed 04/28/2017  . Infertility, female 09/26/2016  . Insomnia 08/10/2015  . Vitamin D deficiency 08/10/2015  . Migraine without aura 05/11/2015  . ADHD (attention deficit hyperactivity disorder) 02/09/2015  . Hypoglycemia 02/09/2015  . Lump or mass in breast 11/15/2012  . Discharge of breast 11/15/2012    Past  Medical History:  Diagnosis Date  . ADD (attention deficit disorder)   . ADHD (attention deficit hyperactivity disorder)   . Vitamin D deficiency     Past Surgical History:  Procedure Laterality Date  . CERVICAL BIOPSY  W/ LOOP ELECTRODE EXCISION    . DG HYSTEROGRAM (HSG)  07/30/2017        Social History   Tobacco Use  . Smoking status: Never Smoker  . Smokeless tobacco: Never Used  Substance Use Topics  . Alcohol use: Not Currently    Comment: rarely     Current Outpatient Medications:  .  albuterol (PROAIR HFA) 108 (90 Base) MCG/ACT inhaler, Inhale 2 puffs into the lungs every 4 (four) hours as needed for wheezing or shortness of breath., Disp: 1 Inhaler, Rfl: 1 .  amphetamine-dextroamphetamine (ADDERALL XR) 25 MG 24 hr capsule, Take 1 capsule by mouth every morning., Disp: 30 capsule, Rfl: 0 .  amphetamine-dextroamphetamine (ADDERALL) 10 MG tablet, Take 1 tablet (10 mg total) by mouth daily. Around 2 pm, Disp: 30 tablet, Rfl: 0 .  beclomethasone (QVAR REDIHALER) 80 MCG/ACT inhaler, Inhale 2 puffs into the lungs 2 (two) times daily. (rinse mouth after use), Disp: 10.6 g, Rfl: 11 .  clomiPHENE (CLOMID) 50 MG tablet, Take 1 tablet (50 mg total) by mouth daily. (Patient not taking:  Reported on 04/12/2018), Disp: 5 tablet, Rfl: 0  Allergies  Allergen Reactions  . Lexapro [Escitalopram Oxalate] Nausea Only  . Hydrocodone Rash  . Vicodin [Hydrocodone-Acetaminophen] Rash    ROS   No other specific complaints in a complete review of systems (except as listed in HPI above).  Objective  There were no vitals filed for this visit.   There is no height or weight on file to calculate BMI.  Nursing Note and Vital Signs reviewed.  Physical Exam Constitutional: Patient appears well-developed and well-nourished. No distress.  HENT: Head: Normocephalic and atraumatic. Pulmonary/Chest: Effort normal  Psychiatric: Patient has a normal mood and affect. behavior is normal. Judgment  and thought content normal.    Assessment & Plan  1. Attention deficit hyperactivity disorder (ADHD), unspecified ADHD type  - amphetamine-dextroamphetamine (ADDERALL XR) 25 MG 24 hr capsule; Take 1 capsule by mouth every morning.  Dispense: 30 capsule; Refill: 0 - amphetamine-dextroamphetamine (ADDERALL) 10 MG tablet; Take 1 tablet (10 mg total) by mouth daily. Around 2 pm  Dispense: 30 tablet; Refill: 0 - amphetamine-dextroamphetamine (ADDERALL XR) 25 MG 24 hr capsule; Take 1 capsule by mouth every morning.  Dispense: 30 capsule; Refill: 0 - amphetamine-dextroamphetamine (ADDERALL XR) 25 MG 24 hr capsule; Take 1 capsule by mouth every morning.  Dispense: 30 capsule; Refill: 0 - amphetamine-dextroamphetamine (ADDERALL) 10 MG tablet; Take 1 tablet (10 mg total) by mouth daily after lunch.  Dispense: 30 tablet; Refill: 0 - amphetamine-dextroamphetamine (ADDERALL) 10 MG tablet; Take 1 tablet (10 mg total) by mouth daily after lunch.  Dispense: 30 tablet; Refill: 0  2. Controlled substance agreement signed PMP aware reviewed.   3. History of asthma Childhood, has albuterol PRN from pneumonia last year   Follow Up Instructions:   3 months  I discussed the assessment and treatment plan with the patient. The patient was provided an opportunity to ask questions and all were answered. The patient agreed with the plan and demonstrated an understanding of the instructions.   The patient was advised to call back or seek an in-person evaluation if the symptoms worsen or if the condition fails to improve as anticipated.  I provided 11 minutes of non-face-to-face time during this encounter, including review .   Cheryle HorsfallElizabeth E Gibson Telleria, NP

## 2019-04-07 DIAGNOSIS — Z3181 Encounter for male factor infertility in female patient: Secondary | ICD-10-CM | POA: Diagnosis not present

## 2019-04-07 DIAGNOSIS — N978 Female infertility of other origin: Secondary | ICD-10-CM | POA: Diagnosis not present

## 2019-04-17 DIAGNOSIS — N979 Female infertility, unspecified: Secondary | ICD-10-CM | POA: Diagnosis not present

## 2019-05-05 DIAGNOSIS — Z3141 Encounter for fertility testing: Secondary | ICD-10-CM | POA: Diagnosis not present

## 2019-07-28 ENCOUNTER — Other Ambulatory Visit (HOSPITAL_COMMUNITY)
Admission: RE | Admit: 2019-07-28 | Discharge: 2019-07-28 | Disposition: A | Discharge status: Home / Self Care | Payer: BC Managed Care – PPO | Source: Ambulatory Visit | Attending: Obstetrics and Gynecology | Admitting: Obstetrics and Gynecology

## 2019-07-28 ENCOUNTER — Other Ambulatory Visit: Payer: Self-pay

## 2019-07-28 ENCOUNTER — Encounter: Payer: Self-pay | Admitting: Obstetrics and Gynecology

## 2019-07-28 ENCOUNTER — Ambulatory Visit (INDEPENDENT_AMBULATORY_CARE_PROVIDER_SITE_OTHER): Payer: BC Managed Care – PPO | Admitting: Obstetrics and Gynecology

## 2019-07-28 VITALS — BP 124/73 | HR 86 | Ht 62.0 in | Wt 127.0 lb

## 2019-07-28 DIAGNOSIS — Z124 Encounter for screening for malignant neoplasm of cervix: Secondary | ICD-10-CM

## 2019-07-28 DIAGNOSIS — Z01419 Encounter for gynecological examination (general) (routine) without abnormal findings: Secondary | ICD-10-CM

## 2019-07-28 DIAGNOSIS — Z1239 Encounter for other screening for malignant neoplasm of breast: Secondary | ICD-10-CM

## 2019-07-28 NOTE — Progress Notes (Signed)
Gynecology Annual Exam   PCP: Kerman Passey, MD  Chief Complaint:  Chief Complaint  Patient presents with  . Gynecologic Exam    History of Present Illness: Patient is a 34 y.o. G1P0010 presents for annual exam. The patient has no complaints today.   LMP: Patient's last menstrual period was 07/19/2019 (exact date). Menses have been regular monthly. Positive OPK,  undergoing IUI's at [facility]  The patient is sexually active. She currently uses none for contraception. She denies dyspareunia.  There is no notable family history of breast or ovarian cancer in her family.  The patient wears seatbelts: yes.   The patient has regular exercise: not asked.    The patient denies current symptoms of depression.    Review of Systems: ROS  Past Medical History:  Past Medical History:  Diagnosis Date  . ADD (attention deficit disorder)   . ADHD (attention deficit hyperactivity disorder)   . Discharge of breast 11/15/2012  . Vitamin D deficiency     Past Surgical History:  Past Surgical History:  Procedure Laterality Date  . CERVICAL BIOPSY  W/ LOOP ELECTRODE EXCISION    . DG HYSTEROGRAM (HSG)  07/30/2017        Gynecologic History:  Patient's last menstrual period was 07/19/2019 (exact date). Contraception: none Last Pap: Results were:07/01/2017 NIL and HR HPV negative   Obstetric History: G1P0010  Family History:  Family History  Problem Relation Age of Onset  . Anxiety disorder Mother   . Migraines Mother   . Hyperlipidemia Father   . Transient ischemic attack Father   . Hypertension Father   . Stroke Father   . Lung disease Father   . Atrial fibrillation Maternal Grandmother   . Cancer Paternal Grandmother        leukemia  . Diabetes Paternal Grandfather   . Cerebral aneurysm Paternal Aunt   . Cerebral aneurysm Maternal Grandfather     Social History:  Social History   Socioeconomic History  . Marital status: Single    Spouse name: Not on file  . Number of  children: Not on file  . Years of education: Not on file  . Highest education level: Not on file  Occupational History  . Not on file  Tobacco Use  . Smoking status: Never Smoker  . Smokeless tobacco: Never Used  Substance and Sexual Activity  . Alcohol use: Not Currently    Comment: rarely  . Drug use: No  . Sexual activity: Yes    Birth control/protection: None  Other Topics Concern  . Not on file  Social History Narrative  . Not on file   Social Determinants of Health   Financial Resource Strain:   . Difficulty of Paying Living Expenses: Not on file  Food Insecurity:   . Worried About Programme researcher, broadcasting/film/video in the Last Year: Not on file  . Ran Out of Food in the Last Year: Not on file  Transportation Needs:   . Lack of Transportation (Medical): Not on file  . Lack of Transportation (Non-Medical): Not on file  Physical Activity:   . Days of Exercise per Week: Not on file  . Minutes of Exercise per Session: Not on file  Stress:   . Feeling of Stress : Not on file  Social Connections:   . Frequency of Communication with Friends and Family: Not on file  . Frequency of Social Gatherings with Friends and Family: Not on file  . Attends Religious Services: Not  on file  . Active Member of Clubs or Organizations: Not on file  . Attends Archivist Meetings: Not on file  . Marital Status: Not on file  Intimate Partner Violence:   . Fear of Current or Ex-Partner: Not on file  . Emotionally Abused: Not on file  . Physically Abused: Not on file  . Sexually Abused: Not on file    Allergies:  Allergies  Allergen Reactions  . Lexapro [Escitalopram Oxalate] Nausea Only  . Hydrocodone Rash  . Vicodin [Hydrocodone-Acetaminophen] Rash    Medications: Prior to Admission medications   Medication Sig Start Date End Date Taking? Authorizing Provider  albuterol (PROAIR HFA) 108 (90 Base) MCG/ACT inhaler Inhale 2 puffs into the lungs every 4 (four) hours as needed for  wheezing or shortness of breath. 04/28/17   Arnetha Courser, MD  amphetamine-dextroamphetamine (ADDERALL XR) 25 MG 24 hr capsule Take 1 capsule by mouth every morning. 01/07/19   Poulose, Bethel Born, NP  amphetamine-dextroamphetamine (ADDERALL XR) 25 MG 24 hr capsule Take 1 capsule by mouth every morning. 02/06/19   Poulose, Bethel Born, NP  amphetamine-dextroamphetamine (ADDERALL XR) 25 MG 24 hr capsule Take 1 capsule by mouth every morning. 03/08/19   Poulose, Bethel Born, NP  amphetamine-dextroamphetamine (ADDERALL) 10 MG tablet Take 1 tablet (10 mg total) by mouth daily. Around 2 pm 01/07/19   Poulose, Bethel Born, NP  amphetamine-dextroamphetamine (ADDERALL) 10 MG tablet Take 1 tablet (10 mg total) by mouth daily after lunch. 02/06/19   Poulose, Bethel Born, NP  amphetamine-dextroamphetamine (ADDERALL) 10 MG tablet Take 1 tablet (10 mg total) by mouth daily after lunch. 03/08/19   Poulose, Bethel Born, NP    Physical Exam Vitals: Height 5\' 2"  (1.575 m), weight 127 lb (57.6 kg).  General: NAD HEENT: normocephalic, anicteric Thyroid: no enlargement, no palpable nodules Pulmonary: No increased work of breathing, CTAB Cardiovascular: RRR, distal pulses 2+ Breast: Breast symmetrical, no tenderness, no palpable nodules or masses, no skin or nipple retraction present, no nipple discharge.  No axillary or supraclavicular lymphadenopathy. Abdomen: NABS, soft, non-tender, non-distended.  Umbilicus without lesions.  No hepatomegaly, splenomegaly or masses palpable. No evidence of hernia  Genitourinary:  External: Normal external female genitalia.  Normal urethral meatus, normal Bartholin's and Skene's glands.    Vagina: Normal vaginal mucosa, no evidence of prolapse.    Cervix: Grossly normal in appearance, no bleeding  Uterus: Non-enlarged, mobile, normal contour.  No CMT  Adnexa: ovaries non-enlarged, no adnexal masses  Rectal: deferred  Lymphatic: no evidence of inguinal  lymphadenopathy Extremities: no edema, erythema, or tenderness Neurologic: Grossly intact Psychiatric: mood appropriate, affect full  Female chaperone present for pelvic and breast  portions of the physical exam  Assessment: 34 y.o. G1P0010 routine annual exam  Plan: Problem List Items Addressed This Visit    None    Visit Diagnoses    Encounter for gynecological examination without abnormal finding    -  Primary   Screening for malignant neoplasm of cervix       Relevant Orders   Cytology - PAP   Breast screening          1) STI screening  was notoffered and therefore not obtained  2)  ASCCP guidelines and rational discussed.  Patient opts for every 3 years screening interval  3) Contraception - the patient is currently using  none.  She is attempting to conceive in the near future  4) Routine healthcare maintenance including cholesterol, diabetes screening discussed managed by  PCP  5) Return in about 1 year (around 07/27/2020) for annual.   Vena Austria, MD, [facility], [facility] 07/28/2019, 8:36 AM

## 2019-07-29 LAB — CYTOLOGY - PAP
Adequacy: ABSENT
Comment: NEGATIVE
Diagnosis: NEGATIVE
High risk HPV: NEGATIVE

## 2019-08-16 ENCOUNTER — Telehealth: Payer: Self-pay | Admitting: Family Medicine

## 2019-08-16 NOTE — Telephone Encounter (Signed)
Pt has an appt on 09/06/2019

## 2019-08-16 NOTE — Telephone Encounter (Signed)
Pt requesting a controlled substance, has not had refill or appointment in 6 months.  Please schedule an appointment please

## 2019-08-16 NOTE — Telephone Encounter (Signed)
Medication Refill - Medication:  amphetamine-dextroamphetamine (ADDERALL XR) 25 MG 24 hr capsule [[id number]]     Preferred Pharmacy (with phone number or street name):  [id number], [place] - [address] AT [facility] OF [address]  [address] [place] [address]  Phone: [phone] Fax: [phone]     Agent: Please be advised that RX refills may take up to 3 business days. We ask that you follow-up with your pharmacy.

## 2019-08-16 NOTE — Telephone Encounter (Signed)
Rx can not be approved until she is seen for that appt

## 2019-08-18 ENCOUNTER — Encounter: Payer: Self-pay | Admitting: Family Medicine

## 2019-09-06 ENCOUNTER — Other Ambulatory Visit: Payer: Self-pay

## 2019-09-06 ENCOUNTER — Encounter: Payer: Self-pay | Admitting: Family Medicine

## 2019-09-06 ENCOUNTER — Ambulatory Visit: Payer: BC Managed Care – PPO | Admitting: Family Medicine

## 2019-09-06 DIAGNOSIS — F909 Attention-deficit hyperactivity disorder, unspecified type: Secondary | ICD-10-CM | POA: Diagnosis not present

## 2019-09-06 MED ORDER — AMPHETAMINE-DEXTROAMPHETAMINE 10 MG PO TABS: 10.0000 mg | ORAL_TABLET | Freq: Two times a day (BID) | ORAL | 0 refills | Status: DC

## 2019-09-06 MED ORDER — AMPHETAMINE-DEXTROAMPHET ER 25 MG PO CP24: 25.0000 mg | ORAL_CAPSULE | ORAL | 0 refills | Status: DC

## 2019-09-06 MED ORDER — AMPHETAMINE-DEXTROAMPHETAMINE 10 MG PO TABS: 10.0000 mg | ORAL_TABLET | Freq: Every day | ORAL | 0 refills | Status: DC

## 2019-09-06 NOTE — Progress Notes (Signed)
Name: Mary Kramer   MRN: [id number]    DOB: 09-16-1985   Date:09/06/2019       Progress Note  Subjective  Chief Complaint  Chief Complaint  Patient presents with  . Medication Refill  . ADHD    HPI  ADHD: she was diagnosed as a child - in middle school. She took it off an on during high school, but resumed medication in her early twenties. She was on Adderal, was switched to Vyvanse but it caused her to feel sleepy and also moody when coming off medication. She states current dose works well, no change in appetite, no change in sleep, bp is at goal     Patient Active Problem List   Diagnosis Date Noted  . History of asthma 01/07/2019  . Controlled substance agreement signed 04/28/2017  . Infertility, female 09/26/2016  . Insomnia 08/10/2015  . Vitamin D deficiency 08/10/2015  . Migraine without aura 05/11/2015  . ADHD (attention deficit hyperactivity disorder) 02/09/2015  . Hypoglycemia 02/09/2015  . Lump or mass in breast 11/15/2012    Past Surgical History:  Procedure Laterality Date  . CERVICAL BIOPSY  W/ LOOP ELECTRODE EXCISION    . DG HYSTEROGRAM (HSG)  07/30/2017        Family History  Problem Relation Age of Onset  . Anxiety disorder Mother   . Migraines Mother   . Hyperlipidemia Father   . Transient ischemic attack Father   . Hypertension Father   . Stroke Father   . Lung disease Father   . Atrial fibrillation Maternal Grandmother   . Cancer Paternal Grandmother        leukemia  . Diabetes Paternal Grandfather   . Cerebral aneurysm Paternal Aunt   . Cerebral aneurysm Maternal Grandfather     Social History   Tobacco Use  . Smoking status: Never Smoker  . Smokeless tobacco: Never Used  Substance Use Topics  . Alcohol use: Not Currently    Comment: rarely     Current Outpatient Medications:  .  albuterol (PROAIR HFA) 108 (90 Base) MCG/ACT inhaler, Inhale 2 puffs into the lungs every 4 (four) hours as needed for wheezing or shortness of  breath., Disp: 1 Inhaler, Rfl: 1 .  amphetamine-dextroamphetamine (ADDERALL XR) 25 MG 24 hr capsule, Take 1 capsule by mouth every morning., Disp: 30 capsule, Rfl: 0 .  amphetamine-dextroamphetamine (ADDERALL) 10 MG tablet, Take 1 tablet (10 mg total) by mouth daily. Around 2 pm, Disp: 30 tablet, Rfl: 0  Allergies  Allergen Reactions  . Lexapro [Escitalopram Oxalate] Nausea Only  . Hydrocodone Rash  . Vicodin [Hydrocodone-Acetaminophen] Rash    I personally reviewed active problem list, medication list, allergies, family history, social history, health maintenance with the patient/caregiver today.   ROS  Ten systems reviewed and is negative except as mentioned in HPI  Objective  Vitals:   09/06/19 0954  BP: 116/70  Pulse: 82  Resp: 16  Temp: (!) 97.5 F (36.4 C)  TempSrc: Temporal  SpO2: 97%  Weight: 130 lb 9.6 oz (59.2 kg)  Height: 5\' 2"  (1.575 m)    Body mass index is 23.89 kg/m.  Physical Exam  Constitutional: Patient appears well-developed and well-nourished. Overweight. No distress.  HEENT: head atraumatic, normocephalic, pupils equal and reactive to light Cardiovascular: Normal rate, regular rhythm and normal heart sounds.  No murmur heard. No BLE edema. Pulmonary/Chest: Effort normal and breath sounds normal. No respiratory distress. Abdominal: Soft.  There is no tenderness. Psychiatric: Patient has a  normal mood and affect. behavior is normal. Judgment and thought content normal.  Recent Results (from the past 2160 hour(s))  Cytology - PAP     Status: None   Collection Time: 07/28/19  9:14 AM  Result Value Ref Range   High risk HPV Negative    Adequacy      Satisfactory for evaluation; transformation zone component ABSENT.   Diagnosis      - Negative for intraepithelial lesion or malignancy (NILM)   Comment Normal Reference Range HPV - Negative      PHQ2/9: Depression screen [facility] 2/9 09/06/2019 01/07/2019 08/13/2018 01/01/2018 07/28/2017  Decreased Interest  0 0 0 0 0  Down, Depressed, Hopeless 0 0 0 0 0  PHQ - 2 Score 0 0 0 0 0  Altered sleeping 0 0 0 - -  Tired, decreased energy 0 0 0 - -  Change in appetite 0 0 0 - -  Feeling bad or failure about yourself  0 0 0 - -  Trouble concentrating 0 0 0 - -  Moving slowly or fidgety/restless 0 0 0 - -  Suicidal thoughts 0 0 0 - -  PHQ-9 Score 0 0 0 - -  Difficult doing work/chores - Not difficult at all Not difficult at all - -    phq 9 is negative   Fall Risk: Fall Risk  09/06/2019 01/07/2019 08/13/2018 04/12/2018 01/01/2018  Falls in the past year? 0 0 0 0 No  Number falls in past yr: 0 0 0 - -  Injury with Fall? 0 0 0 - -    Functional Status Survey: Is the patient deaf or have difficulty hearing?: No Does the patient have difficulty seeing, even when wearing glasses/contacts?: No Does the patient have difficulty concentrating, remembering, or making decisions?: No Does the patient have difficulty walking or climbing stairs?: No Does the patient have difficulty dressing or bathing?: No Does the patient have difficulty doing errands alone such as visiting a doctor's office or shopping?: No   Assessment & Plan   1. Attention deficit hyperactivity disorder (ADHD), unspecified ADHD type  - amphetamine-dextroamphetamine (ADDERALL XR) 25 MG 24 hr capsule; Take 1 capsule by mouth every morning.  Dispense: 30 capsule; Refill: 0 - amphetamine-dextroamphetamine (ADDERALL) 10 MG tablet; Take 1 tablet (10 mg total) by mouth daily.  Dispense: 30 tablet; Refill: 0 - amphetamine-dextroamphetamine (ADDERALL) 10 MG tablet; Take 1 tablet (10 mg total) by mouth 2 (two) times daily. Fill 10/04/2019  Dispense: 30 tablet; Refill: 0 - amphetamine-dextroamphetamine (ADDERALL XR) 25 MG 24 hr capsule; Take 1 capsule by mouth every morning.  Dispense: 30 capsule; Refill: 0 - amphetamine-dextroamphetamine (ADDERALL XR) 25 MG 24 hr capsule; Take 1 capsule by mouth every morning.  Dispense: 30 capsule; Refill: 0 -  amphetamine-dextroamphetamine (ADDERALL) 10 MG tablet; Take 1 tablet (10 mg total) by mouth 2 (two) times daily.  Dispense: 30 tablet; Refill: 0

## 2019-12-27 ENCOUNTER — Encounter: Payer: Self-pay | Admitting: Family Medicine

## 2019-12-27 ENCOUNTER — Other Ambulatory Visit: Payer: Self-pay

## 2019-12-27 ENCOUNTER — Ambulatory Visit: Payer: BC Managed Care – PPO | Admitting: Family Medicine

## 2019-12-27 VITALS — BP 110/70 | HR 102 | Temp 97.5°F | Resp 16 | Ht 62.0 in | Wt 128.3 lb

## 2019-12-27 DIAGNOSIS — F909 Attention-deficit hyperactivity disorder, unspecified type: Secondary | ICD-10-CM | POA: Diagnosis not present

## 2019-12-27 DIAGNOSIS — J45909 Unspecified asthma, uncomplicated: Secondary | ICD-10-CM

## 2019-12-27 DIAGNOSIS — Z1159 Encounter for screening for other viral diseases: Secondary | ICD-10-CM

## 2019-12-27 DIAGNOSIS — Z131 Encounter for screening for diabetes mellitus: Secondary | ICD-10-CM

## 2019-12-27 DIAGNOSIS — E559 Vitamin D deficiency, unspecified: Secondary | ICD-10-CM | POA: Diagnosis not present

## 2019-12-27 DIAGNOSIS — Z13220 Encounter for screening for lipoid disorders: Secondary | ICD-10-CM | POA: Diagnosis not present

## 2019-12-27 DIAGNOSIS — R5383 Other fatigue: Secondary | ICD-10-CM | POA: Diagnosis not present

## 2019-12-27 DIAGNOSIS — G43009 Migraine without aura, not intractable, without status migrainosus: Secondary | ICD-10-CM

## 2019-12-27 MED ORDER — AMPHETAMINE-DEXTROAMPHET ER 25 MG PO CP24: 25.0000 mg | ORAL_CAPSULE | ORAL | 0 refills | Status: DC

## 2019-12-27 MED ORDER — RIZATRIPTAN BENZOATE 10 MG PO TBDP: 10.0000 mg | ORAL_TABLET | ORAL | 0 refills | Status: DC | PRN

## 2019-12-27 MED ORDER — AMPHETAMINE-DEXTROAMPHETAMINE 10 MG PO TABS: 10.0000 mg | ORAL_TABLET | Freq: Two times a day (BID) | ORAL | 0 refills | Status: DC

## 2019-12-27 MED ORDER — AMPHETAMINE-DEXTROAMPHETAMINE 10 MG PO TABS: 10.0000 mg | ORAL_TABLET | Freq: Every day | ORAL | 0 refills | Status: DC

## 2019-12-27 NOTE — Progress Notes (Signed)
Name: Mary Kramer   MRN: [id number]    DOB: [birth year]   Date:12/27/2019       Progress Note  Subjective  Chief Complaint  Chief Complaint  Patient presents with  . ADHD  . Medication Refill    HPI  ADHD: she was diagnosed as a child - in middle school. She took it off an on during high school, but resumed medication in her early twenties. She was on Adderal, was switched to Vyvanse but it caused her to feel sleepy and also moody when coming off medication. She states current dose works well, no change in appetite, no change in sleep, bp is at goal   Fatigue: she has noticed that she has been feeling more tired than usual, no pica, no palpitation, sob or decrease in exercise tolerance. All through the day. She has been busier at work, short staffed, but she does not think that is the cause. She has increased vitamin D 2000 units daily for the past weeks but is not feeling better. She has a history of vitamin D deficiency  RAD: still has albuterol at home, she denies sob , wheezing or cough at this time, she uses medication prn only   Migraine headaches: she states episodes happening a couple of times a month, usually triggered by strong scents, lack of sleep or barometric pressure changes, she takes motrin prn and it takes about 4-5 hours. She has associated nausea, mild photophobia but she keeps working. Pain is described as sharp or dull, on nuchal area, temporal or frontal area. She was diagnosed by a neurologist, she never took a triptan. She states she has brain fog and yawning prior to episode  Fertility: thinking about IVF soon   Patient Active Problem List   Diagnosis Date Noted  . History of asthma 01/07/2019  . Controlled substance agreement signed 04/28/2017  . Infertility, female 09/26/2016  . Insomnia 08/10/2015  . Vitamin D deficiency 08/10/2015  . Migraine without aura 05/11/2015  . ADHD (attention deficit hyperactivity disorder) 02/09/2015  . Hypoglycemia  02/09/2015  . Lump or mass in breast 11/15/2012    Past Surgical History:  Procedure Laterality Date  . CERVICAL BIOPSY  W/ LOOP ELECTRODE EXCISION    . DG HYSTEROGRAM (HSG)  07/30/2017        Family History  Problem Relation Age of Onset  . Anxiety disorder Mother   . Migraines Mother   . Hyperlipidemia Father   . Transient ischemic attack Father   . Hypertension Father   . Stroke Father   . Lung disease Father   . Atrial fibrillation Maternal Grandmother   . Cancer Paternal Grandmother        leukemia  . Diabetes Paternal Grandfather   . Cerebral aneurysm Paternal Aunt   . Cerebral aneurysm Maternal Grandfather     Social History   Tobacco Use  . Smoking status: Never Smoker  . Smokeless tobacco: Never Used  Substance Use Topics  . Alcohol use: Not Currently    Comment: rarely     Current Outpatient Medications:  .  albuterol (PROAIR HFA) 108 (90 Base) MCG/ACT inhaler, Inhale 2 puffs into the lungs every 4 (four) hours as needed for wheezing or shortness of breath., Disp: 1 Inhaler, Rfl: 1 .  amphetamine-dextroamphetamine (ADDERALL XR) 25 MG 24 hr capsule, Take 1 capsule by mouth every morning., Disp: 30 capsule, Rfl: 0 .  amphetamine-dextroamphetamine (ADDERALL XR) 25 MG 24 hr capsule, Take 1 capsule by mouth every morning.,  Disp: 30 capsule, Rfl: 0 .  amphetamine-dextroamphetamine (ADDERALL XR) 25 MG 24 hr capsule, Take 1 capsule by mouth every morning., Disp: 30 capsule, Rfl: 0 .  amphetamine-dextroamphetamine (ADDERALL) 10 MG tablet, Take 1 tablet (10 mg total) by mouth daily., Disp: 30 tablet, Rfl: 0 .  amphetamine-dextroamphetamine (ADDERALL) 10 MG tablet, Take 1 tablet (10 mg total) by mouth 2 (two) times daily. Fill 10/04/2019, Disp: 30 tablet, Rfl: 0 .  amphetamine-dextroamphetamine (ADDERALL) 10 MG tablet, Take 1 tablet (10 mg total) by mouth 2 (two) times daily., Disp: 30 tablet, Rfl: 0  Allergies  Allergen Reactions  . Lexapro [Escitalopram Oxalate]  Nausea Only  . Hydrocodone Rash  . Vicodin [Hydrocodone-Acetaminophen] Rash    I personally reviewed active problem list, medication list, allergies, family history, social history with the patient/caregiver today.   ROS  Constitutional: Negative for fever or weight change.  Respiratory: Negative for cough and shortness of breath.   Cardiovascular: Negative for chest pain or palpitations.  Gastrointestinal: Negative for abdominal pain, no bowel changes.  Musculoskeletal: Negative for gait problem or joint swelling.  Skin: Negative for rash.  Neurological: Negative for dizziness or headache.  No other specific complaints in a complete review of systems (except as listed in HPI above).  Objective  Vitals:   12/27/19 0856  BP: 110/70  Pulse: (!) 102  Resp: 16  Temp: (!) 97.5 F (36.4 C)  TempSrc: Temporal  SpO2: 100%  Weight: 128 lb 4.8 oz (58.2 kg)  Height: 5\' 2"  (1.575 m)    Body mass index is 23.47 kg/m.  Physical Exam  Constitutional: Patient appears well-developed and well-nourished.  No distress.  HEENT: head atraumatic, normocephalic, pupils equal and reactive to light,  neck supple Cardiovascular: Normal rate, regular rhythm and normal heart sounds.  No murmur heard. No BLE edema. Pulmonary/Chest: Effort normal and breath sounds normal. No respiratory distress. Abdominal: Soft.  There is no tenderness. Psychiatric: Patient has a normal mood and affect. behavior is normal. Judgment and thought content normal.  PHQ2/9: Depression screen [facility] 2/9 12/27/2019 09/06/2019 01/07/2019 08/13/2018 01/01/2018  Decreased Interest 0 0 0 0 0  Down, Depressed, Hopeless 0 0 0 0 0  PHQ - 2 Score 0 0 0 0 0  Altered sleeping 0 0 0 0 -  Tired, decreased energy 0 0 0 0 -  Change in appetite 0 0 0 0 -  Feeling bad or failure about yourself  0 0 0 0 -  Trouble concentrating 0 0 0 0 -  Moving slowly or fidgety/restless 0 0 0 0 -  Suicidal thoughts 0 0 0 0 -  PHQ-9 Score 0 0 0 0 -   Difficult doing work/chores - - Not difficult at all Not difficult at all -    phq 9 is negative  Fall Risk: Fall Risk  12/27/2019 09/06/2019 01/07/2019 08/13/2018 04/12/2018  Falls in the past year? 0 0 0 0 0  Number falls in past yr: 0 0 0 0 -  Injury with Fall? 0 0 0 0 -    Functional Status Survey: Is the patient deaf or have difficulty hearing?: No Does the patient have difficulty seeing, even when wearing glasses/contacts?: No Does the patient have difficulty concentrating, remembering, or making decisions?: No Does the patient have difficulty walking or climbing stairs?: No Does the patient have difficulty dressing or bathing?: No Does the patient have difficulty doing errands alone such as visiting a doctor's office or shopping?: No    Assessment &  Plan 1. Attention deficit hyperactivity disorder (ADHD), unspecified ADHD type  - amphetamine-dextroamphetamine (ADDERALL XR) 25 MG 24 hr capsule; Take 1 capsule by mouth every morning.  Dispense: 30 capsule; Refill: 0 - amphetamine-dextroamphetamine (ADDERALL XR) 25 MG 24 hr capsule; Take 1 capsule by mouth every morning.  Dispense: 30 capsule; Refill: 0 - amphetamine-dextroamphetamine (ADDERALL XR) 25 MG 24 hr capsule; Take 1 capsule by mouth every morning.  Dispense: 30 capsule; Refill: 0 - amphetamine-dextroamphetamine (ADDERALL) 10 MG tablet; Take 1 tablet (10 mg total) by mouth daily.  Dispense: 30 tablet; Refill: 0 - amphetamine-dextroamphetamine (ADDERALL) 10 MG tablet; Take 1 tablet (10 mg total) by mouth 2 (two) times daily. Fill 09/02//2021  Dispense: 30 tablet; Refill: 0 - amphetamine-dextroamphetamine (ADDERALL) 10 MG tablet; Take 1 tablet (10 mg total) by mouth 2 (two) times daily.  Dispense: 30 tablet; Refill: 0  2. Other fatigue  - COMPLETE METABOLIC PANEL WITH GFR - CBC with Differential/Platelet - Vitamin B12 - TSH  3. Reactive airway disease with wheezing without complication, unspecified asthma severity,  unspecified whether persistent   4. Need for hepatitis C screening test  - Hepatitis C antibody  5. Lipid screening  - Lipid panel  6. Diabetes mellitus screening  - Hemoglobin A1c  7. Vitamin D deficiency  - VITAMIN D 25 Hydroxy (Vit-D Deficiency, Fractures)  8. Migraine without aura and without status migrainosus, not intractable  - rizatriptan (MAXALT-MLT) 10 MG disintegrating tablet; Take 1 tablet (10 mg total) by mouth as needed for migraine. May repeat in 2 hours if needed  Dispense: 10 tablet; Refill: 0

## 2019-12-28 LAB — TSH: TSH: 3.08 mIU/L

## 2019-12-28 LAB — LIPID PANEL
Cholesterol: 171 mg/dL (ref <200)
HDL: 55 mg/dL (ref >=50)
LDL Cholesterol (Calc): 101 mg/dL (calc) — ABNORMAL HIGH
Non-HDL Cholesterol (Calc): 116 mg/dL (calc) (ref <130)
Total CHOL/HDL Ratio: 3.1 (calc) (ref <5.0)
Triglycerides: 64 mg/dL (ref <150)

## 2019-12-28 LAB — COMPLETE METABOLIC PANEL WITH GFR
AG Ratio: 1.8 (calc) (ref 1.0–2.5)
ALT: 16 U/L (ref 6–29)
AST: 17 U/L (ref 10–30)
Albumin: 4.2 g/dL (ref 3.6–5.1)
Alkaline phosphatase (APISO): 37 U/L (ref 31–125)
BUN: 12 mg/dL (ref 7–25)
CO2: 28 mmol/L (ref 20–32)
Calcium: 9 mg/dL (ref 8.6–10.2)
Chloride: 104 mmol/L (ref 98–110)
Creat: 0.76 mg/dL (ref 0.50–1.10)
GFR, Est African American: 119 mL/min/{1.73_m2} (ref >=60)
GFR, Est Non African American: 102 mL/min/{1.73_m2} (ref >=60)
Globulin: 2.4 g/dL (calc) (ref 1.9–3.7)
Glucose, Bld: 92 mg/dL (ref 65–99)
Potassium: 4.6 mmol/L (ref 3.5–5.3)
Sodium: 138 mmol/L (ref 135–146)
Total Bilirubin: 0.4 mg/dL (ref 0.2–1.2)
Total Protein: 6.6 g/dL (ref 6.1–8.1)

## 2019-12-28 LAB — CBC WITH DIFFERENTIAL/PLATELET
Absolute Monocytes: 730 cells/uL (ref 200–950)
Basophils Absolute: 53 cells/uL (ref 0–200)
Basophils Relative: 0.7 %
Eosinophils Absolute: 99 cells/uL (ref 15–500)
Eosinophils Relative: 1.3 %
HCT: 40.9 % (ref 35.0–45.0)
Hemoglobin: 13.4 g/dL (ref 11.7–15.5)
Lymphs Abs: 2318 cells/uL (ref 850–3900)
MCH: 29.4 pg (ref 27.0–33.0)
MCHC: 32.8 g/dL (ref 32.0–36.0)
MCV: 89.7 fL (ref 80.0–100.0)
MPV: 9.9 fL (ref 7.5–12.5)
Monocytes Relative: 9.6 %
Neutro Abs: 4400 cells/uL (ref 1500–7800)
Neutrophils Relative %: 57.9 %
Platelets: 347 10*3/uL (ref 140–400)
RBC: 4.56 10*6/uL (ref 3.80–5.10)
RDW: 12.4 % (ref 11.0–15.0)
Total Lymphocyte: 30.5 %
WBC: 7.6 10*3/uL (ref 3.8–10.8)

## 2019-12-28 LAB — VITAMIN D 25 HYDROXY (VIT D DEFICIENCY, FRACTURES): Vit D, 25-Hydroxy: 30 ng/mL (ref 30–100)

## 2019-12-28 LAB — VITAMIN B12: Vitamin B-12: 531 pg/mL (ref 200–1100)

## 2019-12-28 LAB — HEMOGLOBIN A1C
Hgb A1c MFr Bld: 5.1 % of total Hgb (ref <5.7)
Mean Plasma Glucose: 100 (calc)
eAG (mmol/L): 5.5 (calc)

## 2019-12-28 LAB — HEPATITIS C ANTIBODY
Hepatitis C Ab: NONREACTIVE
SIGNAL TO CUT-OFF: 0.01 (ref <1.00)

## 2020-01-17 DIAGNOSIS — M5416 Radiculopathy, lumbar region: Secondary | ICD-10-CM | POA: Diagnosis not present

## 2020-06-05 NOTE — Progress Notes (Signed)
Name: Mary Kramer   MRN: [id number]    DOB: August 03, 1985   Date:06/05/2020       Progress Note  Subjective  Chief Complaint  Medication refill  I connected with  Barkley Boards  on 06/05/20 at  9:40 AM EST by telephone  and verified that I am speaking with the correct person using two identifiers.  I discussed the limitations of evaluation and management by telemedicine and the availability of in person appointments. The patient expressed understanding and agreed to proceed with the virtual visit  Staff also discussed with the patient that there may be a patient responsible charge related to this service. Patient Location: at work  Provider Location: [facility] Additional Individuals present: alone   HPI  ADHD: she was diagnosed as a child - in middle school. She took it off an on during high school, but resumed medication in her early twenties. She was on Adderal, was switched to Vyvanse but it caused her to feel sleepy and also moody when coming off medication. She states current dose works well, no change in appetite, no change in sleep, she needs refills of her medications. She has been taking immediate release most days because they are so short staffed at work   RAD: still has albuterol at home, she denies sob , wheezing or cough at this time, she uses medication prn only She would like to get a refill today   Migraine headaches: she states episodes happening a couple of times a month, usually triggered by strong scents, lack of sleep or barometric pressure changes, she has aura described as yawning but has not tried maxalt yet because Motrin control pain within  4-5 hours. She has associated nausea, mild photophobia but she keeps working. Pain is described as sharp or dull, on nuchal area, temporal or frontal area. She was diagnosed by a neurologist, she never took a triptan.  She states she has two family members that had brain aneurysm - grandfather and one aunt at age 54.  Discussed getting a CTA since she has frequent dull headaches , but she would like to hold off for now. She will contact me if she changes her mind   Fertility: she is considering IVG, she had a cyst in December, still seeing fertility sub-specialist    Patient Active Problem List   Diagnosis Date Noted  . History of asthma 01/07/2019  . Controlled substance agreement signed 04/28/2017  . Infertility, female 09/26/2016  . Insomnia 08/10/2015  . Vitamin D deficiency 08/10/2015  . Migraine without aura 05/11/2015  . ADHD (attention deficit hyperactivity disorder) 02/09/2015  . Hypoglycemia 02/09/2015  . Lump or mass in breast 11/15/2012    Past Surgical History:  Procedure Laterality Date  . CERVICAL BIOPSY  W/ LOOP ELECTRODE EXCISION    . DG HYSTEROGRAM (HSG)  07/30/2017        Family History  Problem Relation Age of Onset  . Anxiety disorder Mother   . Migraines Mother   . Hyperlipidemia Father   . Transient ischemic attack Father   . Hypertension Father   . Stroke Father   . Lung disease Father   . Atrial fibrillation Maternal Grandmother   . Cancer Paternal Grandmother        leukemia  . Diabetes Paternal Grandfather   . Cerebral aneurysm Paternal Aunt   . Cerebral aneurysm Maternal Grandfather       Current Outpatient Medications:  .  albuterol (PROAIR HFA) 108 (90 Base) MCG/ACT  inhaler, Inhale 2 puffs into the lungs every 4 (four) hours as needed for wheezing or shortness of breath., Disp: 1 Inhaler, Rfl: 1 .  amphetamine-dextroamphetamine (ADDERALL XR) 25 MG 24 hr capsule, Take 1 capsule by mouth every morning., Disp: 30 capsule, Rfl: 0 .  amphetamine-dextroamphetamine (ADDERALL XR) 25 MG 24 hr capsule, Take 1 capsule by mouth every morning., Disp: 30 capsule, Rfl: 0 .  amphetamine-dextroamphetamine (ADDERALL XR) 25 MG 24 hr capsule, Take 1 capsule by mouth every morning., Disp: 30 capsule, Rfl: 0 .  amphetamine-dextroamphetamine (ADDERALL) 10 MG tablet, Take 1  tablet (10 mg total) by mouth daily., Disp: 30 tablet, Rfl: 0 .  amphetamine-dextroamphetamine (ADDERALL) 10 MG tablet, Take 1 tablet (10 mg total) by mouth 2 (two) times daily. Fill 09/02//2021, Disp: 30 tablet, Rfl: 0 .  amphetamine-dextroamphetamine (ADDERALL) 10 MG tablet, Take 1 tablet (10 mg total) by mouth 2 (two) times daily., Disp: 30 tablet, Rfl: 0 .  rizatriptan (MAXALT-MLT) 10 MG disintegrating tablet, Take 1 tablet (10 mg total) by mouth as needed for migraine. May repeat in 2 hours if needed, Disp: 10 tablet, Rfl: 0  Allergies  Allergen Reactions  . Lexapro [Escitalopram Oxalate] Nausea Only  . Hydrocodone Rash  . Vicodin [Hydrocodone-Acetaminophen] Rash    I personally reviewed active problem list, medication list, allergies, family history, social history, health maintenance with the patient/caregiver today.   ROS   Ten systems reviewed and is negative except as mentioned in HPI   Objective  Virtual encounter, vitals not obtained.  There is no height or weight on file to calculate BMI.  Physical Exam  Awake, alert and oriented   PHQ2/9: Depression screen [facility] 2/9 12/27/2019 09/06/2019 01/07/2019 08/13/2018 01/01/2018  Decreased Interest 0 0 0 0 0  Down, Depressed, Hopeless 0 0 0 0 0  PHQ - 2 Score 0 0 0 0 0  Altered sleeping 0 0 0 0 -  Tired, decreased energy 0 0 0 0 -  Change in appetite 0 0 0 0 -  Feeling bad or failure about yourself  0 0 0 0 -  Trouble concentrating 0 0 0 0 -  Moving slowly or fidgety/restless 0 0 0 0 -  Suicidal thoughts 0 0 0 0 -  PHQ-9 Score 0 0 0 0 -  Difficult doing work/chores - - Not difficult at all Not difficult at all -   PHQ-2/9 Result is negative.    Fall Risk: Fall Risk  12/27/2019 09/06/2019 01/07/2019 08/13/2018 04/12/2018  Falls in the past year? 0 0 0 0 0  Number falls in past yr: 0 0 0 0 -  Injury with Fall? 0 0 0 0 -     Assessment & Plan  1. Attention deficit hyperactivity disorder (ADHD), unspecified ADHD type  -  amphetamine-dextroamphetamine (ADDERALL XR) 25 MG 24 hr capsule; Take 1 capsule by mouth every morning.  Dispense: 30 capsule; Refill: 0 - amphetamine-dextroamphetamine (ADDERALL XR) 25 MG 24 hr capsule; Take 1 capsule by mouth every morning.  Dispense: 30 capsule; Refill: 0 - amphetamine-dextroamphetamine (ADDERALL XR) 25 MG 24 hr capsule; Take 1 capsule by mouth every morning.  Dispense: 30 capsule; Refill: 0 - amphetamine-dextroamphetamine (ADDERALL) 10 MG tablet; Take 1 tablet (10 mg total) by mouth daily.  Dispense: 30 tablet; Refill: 0 - amphetamine-dextroamphetamine (ADDERALL) 10 MG tablet; Take 1 tablet (10 mg total) by mouth 2 (two) times daily. Fill 07/06/2020  Dispense: 30 tablet; Refill: 0 - amphetamine-dextroamphetamine (ADDERALL) 10 MG tablet; Take 1 tablet (  10 mg total) by mouth 2 (two) times daily.  Dispense: 30 tablet; Refill: 0  2. Family history of brain aneurysm   3. Frequent headaches    4. Reactive airway disease with wheezing without complication, unspecified asthma severity, unspecified whether persistent  She needs refill of albuterol   5. Migraine without aura and without status migrainosus, not intractable  I discussed the assessment and treatment plan with the patient. The patient was provided an opportunity to ask questions and all were answered. The patient agreed with the plan and demonstrated an understanding of the instructions.  The patient was advised to call back or seek an in-person evaluation if the symptoms worsen or if the condition fails to improve as anticipated.  I provided 25  minutes of non-face-to-face time during this encounter.

## 2020-06-06 ENCOUNTER — Encounter: Payer: Self-pay | Admitting: Family Medicine

## 2020-06-06 ENCOUNTER — Telehealth (INDEPENDENT_AMBULATORY_CARE_PROVIDER_SITE_OTHER): Payer: BC Managed Care – PPO | Admitting: Family Medicine

## 2020-06-06 VITALS — Wt 126.0 lb

## 2020-06-06 DIAGNOSIS — Z8249 Family history of ischemic heart disease and other diseases of the circulatory system: Secondary | ICD-10-CM | POA: Diagnosis not present

## 2020-06-06 DIAGNOSIS — J45909 Unspecified asthma, uncomplicated: Secondary | ICD-10-CM | POA: Diagnosis not present

## 2020-06-06 DIAGNOSIS — R519 Headache, unspecified: Secondary | ICD-10-CM

## 2020-06-06 DIAGNOSIS — F909 Attention-deficit hyperactivity disorder, unspecified type: Secondary | ICD-10-CM | POA: Diagnosis not present

## 2020-06-06 DIAGNOSIS — G43009 Migraine without aura, not intractable, without status migrainosus: Secondary | ICD-10-CM

## 2020-06-06 MED ORDER — AMPHETAMINE-DEXTROAMPHET ER 25 MG PO CP24: 25.0000 mg | ORAL_CAPSULE | ORAL | 0 refills | Status: DC

## 2020-06-06 MED ORDER — AMPHETAMINE-DEXTROAMPHETAMINE 10 MG PO TABS: 10.0000 mg | ORAL_TABLET | Freq: Every day | ORAL | 0 refills | Status: DC

## 2020-06-06 MED ORDER — ALBUTEROL SULFATE HFA 108 (90 BASE) MCG/ACT IN AERS: 2.0000 | INHALATION_SPRAY | RESPIRATORY_TRACT | 0 refills | Status: DC | PRN

## 2020-06-06 MED ORDER — AMPHETAMINE-DEXTROAMPHETAMINE 10 MG PO TABS: 10.0000 mg | ORAL_TABLET | Freq: Two times a day (BID) | ORAL | 0 refills | Status: DC

## 2020-06-19 DIAGNOSIS — Z20828 Contact with and (suspected) exposure to other viral communicable diseases: Secondary | ICD-10-CM | POA: Diagnosis not present

## 2020-06-27 DIAGNOSIS — Z20828 Contact with and (suspected) exposure to other viral communicable diseases: Secondary | ICD-10-CM | POA: Diagnosis not present

## 2020-06-27 DIAGNOSIS — R0981 Nasal congestion: Secondary | ICD-10-CM | POA: Diagnosis not present

## 2020-07-25 DIAGNOSIS — Z23 Encounter for immunization: Secondary | ICD-10-CM | POA: Diagnosis not present

## 2020-08-16 ENCOUNTER — Other Ambulatory Visit: Payer: Self-pay | Admitting: Family Medicine

## 2020-08-16 DIAGNOSIS — F909 Attention-deficit hyperactivity disorder, unspecified type: Secondary | ICD-10-CM

## 2020-08-16 NOTE — Telephone Encounter (Signed)
Pt states [facility] will not let her pick up up her adderal script dated in Feb.  Pt states she only picked up Jan's Rx. When she tried to pick up Feb, they said no, date has run out. She did not pick up Feb or March adderal, neither one. Please advise

## 2020-08-16 NOTE — Telephone Encounter (Signed)
Requested medication (s) are due for refill today: yes  Requested medication (s) are on the active medication list: yes  Last refill:  Future visit scheduled: yes  Notes to clinic:  not delegated   Requested Prescriptions  Pending Prescriptions Disp Refills   amphetamine-dextroamphetamine (ADDERALL) 10 MG tablet 30 tablet 0    Sig: Take 1 tablet (10 mg total) by mouth 2 (two) times daily.      Not Delegated - Psychiatry:  Stimulants/ADHD Failed - 08/16/2020  1:49 PM      Failed - This refill cannot be delegated      Failed - Urine Drug Screen completed in last 360 days      Passed - Valid encounter within last 3 months    Recent Outpatient Visits           2 months ago Family history of brain aneurysm   [facility] Alba Cory, MD   7 months ago Attention deficit hyperactivity disorder (ADHD), unspecified ADHD type   [facility] Alba Cory, MD   11 months ago Attention deficit hyperactivity disorder (ADHD), unspecified ADHD type   [facility] Alba Cory, MD   1 year ago Attention deficit hyperactivity disorder (ADHD), unspecified ADHD type   [facility] Cheryle Horsfall, NP   2 years ago Attention deficit hyperactivity disorder (ADHD), unspecified ADHD type   [facility] [address] [facility] Alba Cory, MD       Future Appointments             In 2 weeks Alba Cory, MD [facility], [facility]

## 2020-09-04 ENCOUNTER — Ambulatory Visit: Payer: BC Managed Care – PPO | Admitting: Family Medicine

## 2020-09-04 ENCOUNTER — Other Ambulatory Visit: Payer: Self-pay

## 2020-09-04 ENCOUNTER — Encounter: Payer: Self-pay | Admitting: Family Medicine

## 2020-09-04 VITALS — BP 116/76 | HR 99 | Temp 98.2°F | Resp 16 | Ht 62.0 in | Wt 125.0 lb

## 2020-09-04 DIAGNOSIS — J45909 Unspecified asthma, uncomplicated: Secondary | ICD-10-CM

## 2020-09-04 DIAGNOSIS — F909 Attention-deficit hyperactivity disorder, unspecified type: Secondary | ICD-10-CM

## 2020-09-04 DIAGNOSIS — G43009 Migraine without aura, not intractable, without status migrainosus: Secondary | ICD-10-CM

## 2020-09-04 DIAGNOSIS — E559 Vitamin D deficiency, unspecified: Secondary | ICD-10-CM | POA: Diagnosis not present

## 2020-09-04 MED ORDER — AMPHETAMINE-DEXTROAMPHETAMINE 10 MG PO TABS: 10.0000 mg | ORAL_TABLET | Freq: Every day | ORAL | 0 refills | Status: DC

## 2020-09-04 MED ORDER — AMPHETAMINE-DEXTROAMPHETAMINE 10 MG PO TABS: 10.0000 mg | ORAL_TABLET | Freq: Two times a day (BID) | ORAL | 0 refills | Status: DC

## 2020-09-04 MED ORDER — NURTEC 75 MG PO TBDP: 1.0000 | ORAL_TABLET | Freq: Every day | ORAL | 2 refills | Status: DC | PRN

## 2020-09-04 MED ORDER — AMPHETAMINE-DEXTROAMPHET ER 25 MG PO CP24: 25.0000 mg | ORAL_CAPSULE | ORAL | 0 refills | Status: DC

## 2020-09-04 NOTE — Progress Notes (Signed)
Name: Mary Kramer   MRN: [id number]    DOB: 08-19-1985   Date:09/04/2020       Progress Note  Subjective  Chief Complaint  Follow Up  HPI  ADHD: she was diagnosed as a child - in middle school. She took it off an on during high school, but resumed medication in her early twenties. She was on Adderal, was switched to Vyvanse but it caused her to feel sleepy and also moody when coming off medication. She states current dose works well, no change in appetite, no change in sleep, she needs refills of her medications. She has been taking immediate release most days because they are so short staffed at work   RAD: still has albuterol at home, she denies sob , wheezing or cough at this time, she uses medication prn only She usually has flares with cold symptoms no longer having seasonal allergies problems . Discussed PCV 20 but she would like to hold off for now   Migraine headaches: she states episodes happening a couple of times a month, usually triggered by strong scents, lack of sleep or barometric pressure changes, she has aura described as yawning but has not tried maxalt yet because Motrin control pain within  4-5 hours. She has associated nausea, mild photophobia but she keeps working. Pain is described as sharp or dull, on nuchal area, temporal or frontal area. She was diagnosed by a neurologist, she never took a triptan.  She states she has two family members that had brain aneurysm - grandfather and one aunt at age 75. Last visit she was having a lot of dull daily headaches, but since new contacts she is doing better.   Fertility: she is considering IVG, she had a cyst in December, still seeing fertility sub-specialist. She is using and injectable instead of clomid    Patient Active Problem List   Diagnosis Date Noted  . Asthma, stable, mild intermittent 01/07/2019  . Controlled substance agreement signed 04/28/2017  . Infertility, female 09/26/2016  . Insomnia 08/10/2015  .  Vitamin D deficiency 08/10/2015  . Migraine without aura 05/11/2015  . ADHD (attention deficit hyperactivity disorder) 02/09/2015  . Hypoglycemia 02/09/2015  . Lump or mass in breast 11/15/2012    Past Surgical History:  Procedure Laterality Date  . CERVICAL BIOPSY  W/ LOOP ELECTRODE EXCISION    . DG HYSTEROGRAM (HSG)  07/30/2017        Family History  Problem Relation Age of Onset  . Anxiety disorder Mother   . Migraines Mother   . Hyperlipidemia Father   . Transient ischemic attack Father   . Hypertension Father   . Stroke Father   . Lung disease Father   . Atrial fibrillation Maternal Grandmother   . Cancer Paternal Grandmother        leukemia  . Diabetes Paternal Grandfather   . Cerebral aneurysm Paternal Aunt   . Cerebral aneurysm Maternal Grandfather     Social History   Tobacco Use  . Smoking status: Never Smoker  . Smokeless tobacco: Never Used  Substance Use Topics  . Alcohol use: Not Currently    Comment: rarely     Current Outpatient Medications:  .  albuterol (PROAIR HFA) 108 (90 Base) MCG/ACT inhaler, Inhale 2 puffs into the lungs every 4 (four) hours as needed for wheezing or shortness of breath., Disp: 1 each, Rfl: 0 .  amphetamine-dextroamphetamine (ADDERALL XR) 25 MG 24 hr capsule, Take 1 capsule by mouth every morning., Disp: 30  capsule, Rfl: 0 .  amphetamine-dextroamphetamine (ADDERALL XR) 25 MG 24 hr capsule, Take 1 capsule by mouth every morning., Disp: 30 capsule, Rfl: 0 .  amphetamine-dextroamphetamine (ADDERALL XR) 25 MG 24 hr capsule, Take 1 capsule by mouth every morning., Disp: 30 capsule, Rfl: 0 .  amphetamine-dextroamphetamine (ADDERALL) 10 MG tablet, Take 1 tablet (10 mg total) by mouth daily., Disp: 30 tablet, Rfl: 0 .  amphetamine-dextroamphetamine (ADDERALL) 10 MG tablet, Take 1 tablet (10 mg total) by mouth 2 (two) times daily. Fill 07/06/2020, Disp: 30 tablet, Rfl: 0 .  amphetamine-dextroamphetamine (ADDERALL) 10 MG tablet, Take 1  tablet (10 mg total) by mouth 2 (two) times daily., Disp: 30 tablet, Rfl: 0 .  rizatriptan (MAXALT-MLT) 10 MG disintegrating tablet, Take 1 tablet (10 mg total) by mouth as needed for migraine. May repeat in 2 hours if needed, Disp: 10 tablet, Rfl: 0  Allergies  Allergen Reactions  . Lexapro [Escitalopram Oxalate] Nausea Only  . Hydrocodone Rash  . Vicodin [Hydrocodone-Acetaminophen] Rash    I personally reviewed active problem list, medication list, allergies, family history, social history, health maintenance with the patient/caregiver today.   ROS  Constitutional: Negative for fever or weight change.  Respiratory: Negative for cough and shortness of breath.   Cardiovascular: Negative for chest pain or palpitations.  Gastrointestinal: Negative for abdominal pain, no bowel changes.  Musculoskeletal: Negative for gait problem or joint swelling.  Skin: Negative for rash.  Neurological: Negative for dizziness , positive headache.  No other specific complaints in a complete review of systems (except as listed in HPI above).   Objective  Vitals:   09/04/20 1510  BP: 116/76  Pulse: 99  Resp: 16  Temp: 98.2 F (36.8 C)  TempSrc: Oral  SpO2: 98%  Weight: 125 lb (56.7 kg)  Height: 5\' 2"  (1.575 m)    Body mass index is 22.86 kg/m.  Physical Exam  Constitutional: Patient appears well-developed and well-nourished. Obese  No distress.  HEENT: head atraumatic, normocephalic, pupils equal and reactive to light,  neck supple Cardiovascular: Normal rate, regular rhythm and normal heart sounds.  No murmur heard. No BLE edema. Pulmonary/Chest: Effort normal and breath sounds normal. No respiratory distress. Abdominal: Soft.  There is no tenderness. Psychiatric: Patient has a normal mood and affect. behavior is normal. Judgment and thought content normal.  PHQ2/9: Depression screen [facility] 2/9 09/04/2020 06/06/2020 12/27/2019 09/06/2019 01/07/2019  Decreased Interest 0 0 0 0 0  Down,  Depressed, Hopeless 0 0 0 0 0  PHQ - 2 Score 0 0 0 0 0  Altered sleeping - 0 0 0 0  Tired, decreased energy - 0 0 0 0  Change in appetite - 0 0 0 0  Feeling bad or failure about yourself  - 0 0 0 0  Trouble concentrating - 0 0 0 0  Moving slowly or fidgety/restless - 0 0 0 0  Suicidal thoughts - 0 0 0 0  PHQ-9 Score - 0 0 0 0  Difficult doing work/chores - Not difficult at all - - Not difficult at all    phq 9 is negative  Fall Risk: Fall Risk  09/04/2020 06/06/2020 12/27/2019 09/06/2019 01/07/2019  Falls in the past year? 0 0 0 0 0  Number falls in past yr: 0 0 0 0 0  Injury with Fall? 0 0 0 0 0      Functional Status Survey: Is the patient deaf or have difficulty hearing?: No Does the patient have difficulty seeing, even when  wearing glasses/contacts?: No Does the patient have difficulty concentrating, remembering, or making decisions?: No Does the patient have difficulty walking or climbing stairs?: No Does the patient have difficulty dressing or bathing?: No Does the patient have difficulty doing errands alone such as visiting a doctor's office or shopping?: No    Assessment & Plan  1. Attention deficit hyperactivity disorder (ADHD), unspecified ADHD type  - amphetamine-dextroamphetamine (ADDERALL XR) 25 MG 24 hr capsule; Take 1 capsule by mouth every morning.  Dispense: 30 capsule; Refill: 0 - amphetamine-dextroamphetamine (ADDERALL XR) 25 MG 24 hr capsule; Take 1 capsule by mouth every morning.  Dispense: 30 capsule; Refill: 0 - amphetamine-dextroamphetamine (ADDERALL XR) 25 MG 24 hr capsule; Take 1 capsule by mouth every morning.  Dispense: 30 capsule; Refill: 0 - amphetamine-dextroamphetamine (ADDERALL) 10 MG tablet; Take 1 tablet (10 mg total) by mouth daily.  Dispense: 30 tablet; Refill: 0 - amphetamine-dextroamphetamine (ADDERALL) 10 MG tablet; Take 1 tablet (10 mg total) by mouth 2 (two) times daily. Fill 10/02/2020  Dispense: 30 tablet; Refill: 0 -  amphetamine-dextroamphetamine (ADDERALL) 10 MG tablet; Take 1 tablet (10 mg total) by mouth 2 (two) times daily.  Dispense: 30 tablet; Refill: 0  2. Reactive airway disease with wheezing without complication, unspecified asthma severity, unspecified whether persistent   3. Migraine without aura and without status migrainosus, not intractable  - Rimegepant Sulfate (NURTEC) 75 MG TBDP; Take 1 tablet by mouth daily as needed.  Dispense: 8 tablet; Refill: 2  4. Vitamin D deficiency  Continue supplementation

## 2020-10-25 ENCOUNTER — Telehealth: Payer: Self-pay

## 2020-10-25 NOTE — Telephone Encounter (Signed)
Pt calling; is 7wks preg; has a little bit of cramping on the left side; has hx of ovarian cysts on left side; cramping is not constant and is not severe; if it is a cyst - will everything be okay?  [phone]  Adv ok to have ov cyst while preg; cramping normal through out the whole preg as long as it doesn't make her double over or stop her in her tracks.  Pt reassured.

## 2020-10-31 ENCOUNTER — Other Ambulatory Visit: Payer: Self-pay

## 2020-10-31 ENCOUNTER — Encounter: Payer: Self-pay | Admitting: Obstetrics and Gynecology

## 2020-10-31 ENCOUNTER — Ambulatory Visit (INDEPENDENT_AMBULATORY_CARE_PROVIDER_SITE_OTHER): Payer: BC Managed Care – PPO | Admitting: Obstetrics and Gynecology

## 2020-10-31 ENCOUNTER — Encounter: Payer: BC Managed Care – PPO | Admitting: Obstetrics and Gynecology

## 2020-10-31 ENCOUNTER — Other Ambulatory Visit (HOSPITAL_COMMUNITY)
Admission: RE | Admit: 2020-10-31 | Discharge: 2020-10-31 | Disposition: A | Discharge status: Home / Self Care | Payer: BC Managed Care – PPO | Source: Ambulatory Visit | Attending: Obstetrics and Gynecology | Admitting: Obstetrics and Gynecology

## 2020-10-31 VITALS — BP 112/68 | HR 89 | Wt 134.0 lb

## 2020-10-31 DIAGNOSIS — O09529 Supervision of elderly multigravida, unspecified trimester: Secondary | ICD-10-CM | POA: Insufficient documentation

## 2020-10-31 DIAGNOSIS — Z3400 Encounter for supervision of normal first pregnancy, unspecified trimester: Secondary | ICD-10-CM | POA: Insufficient documentation

## 2020-10-31 DIAGNOSIS — O0990 Supervision of high risk pregnancy, unspecified, unspecified trimester: Secondary | ICD-10-CM | POA: Insufficient documentation

## 2020-10-31 DIAGNOSIS — Z148 Genetic carrier of other disease: Secondary | ICD-10-CM

## 2020-10-31 DIAGNOSIS — Z369 Encounter for antenatal screening, unspecified: Secondary | ICD-10-CM | POA: Insufficient documentation

## 2020-10-31 DIAGNOSIS — N926 Irregular menstruation, unspecified: Secondary | ICD-10-CM

## 2020-10-31 LAB — POCT URINE PREGNANCY: Preg Test, Ur: POSITIVE — AB

## 2020-10-31 NOTE — Progress Notes (Signed)
New Obstetric Patient H&P    Chief Complaint: "Desires prenatal care"   History of Present Illness: Patient is a 35 y.o. G1P0010 Not Hispanic or Latino female, presents with amenorrhea and positive home pregnancy test. Patient's last menstrual period was 09/05/2020 (exact date). and based on her  LMP, her EDD is Estimated Date of Delivery: 06/12/21 and her EGA is [redacted]w[redacted]d. Cycles are regular monthly. Her last pap smear was on 07/28/2019 and was NILM HPV negative.   Since her LMP she claims she has experienced fatigue, mild nausea. She denies vaginal bleeding. Her past medical history is noncontributory.   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 no  Genetic Screening/Teratology Counseling: (Includes patient, baby's father, or anyone in either family with:)   1. Patient's age >/= 48 [facility]  yes 2. Thalassemia ([place], [place], Mediterranean, or Asian background): MCV<80  no 3. Neural tube defect (meningomyelocele, spina bifida, anencephaly)  no 4. Congenital heart defect  no  5. Down syndrome  no 6. Tay-Sachs (Jewish, [place]  no 7. Canavan's Disease  no 8. Sickle cell disease or trait (African)  no  9. Hemophilia or other blood disorders  no  10. Muscular dystrophy  no  11. Cystic fibrosis  no  12. Huntington's Chorea  no  13. Mental retardation/autism  no 14. Other inherited genetic or chromosomal disorder  Yes patient SMA carrier with husband negative 15. Maternal metabolic disorder (DM, PKU, etc)  no 16. Patient or FOB with a child with a birth defect not listed above no  16a. Patient or FOB with a birth defect themselves no 17. Recurrent pregnancy  loss, or stillbirth  no  18. Any medications since LMP other than prenatal vitamins (include vitamins, supplements, OTC meds, drugs, alcohol)  no 19. Any other genetic/environmental exposure to discuss  no  Infection History:   1. Lives with someone with TB or TB exposed  no  2. Patient or partner has history of genital herpes  no 3. Rash or viral illness since LMP  no 4. History of STI (GC, CT, HPV, syphilis, HIV)  no 5. History of recent travel :  no  Other pertinent information:  no     Review of Systems:10 point review of systems negative unless otherwise noted in HPI  Past Medical History:  Patient Active Problem List   Diagnosis Date Noted  . Asthma, stable, mild intermittent 01/07/2019  . Controlled substance agreement signed 04/28/2017  . Infertility, female 09/26/2016  . Insomnia 08/10/2015  . Vitamin D deficiency 08/10/2015  . Migraine without aura 05/11/2015  . ADHD (attention deficit hyperactivity disorder) 02/09/2015  . Hypoglycemia 02/09/2015  . Lump or mass in breast 11/15/2012    Past Surgical History:  Past Surgical History:  Procedure Laterality Date  . CERVICAL BIOPSY  W/ LOOP ELECTRODE EXCISION    . DG HYSTEROGRAM (HSG)  07/30/2017        Gynecologic History: No LMP recorded.  Obstetric History: G1P0010  Family History:  Family History  Problem Relation Age of Onset  . Anxiety disorder Mother   . Migraines Mother   . Hyperlipidemia  Father   . Transient ischemic attack Father   . Hypertension Father   . Stroke Father   . Lung disease Father   . Atrial fibrillation Maternal Grandmother   . Cancer Paternal Grandmother        leukemia  . Diabetes Paternal Grandfather   . Cerebral aneurysm Paternal Aunt   . Cerebral aneurysm Maternal Grandfather     Social History:  Social History   Socioeconomic History  . Marital status: Single    Spouse name: Not on file  . Number of children: Not on file  . Years of education: Not on file  .  Highest education level: Not on file  Occupational History  . Not on file  Tobacco Use  . Smoking status: Never Smoker  . Smokeless tobacco: Never Used  Vaping Use  . Vaping Use: Never used  Substance and Sexual Activity  . Alcohol use: Not Currently    Comment: rarely  . Drug use: No  . Sexual activity: Yes    Birth control/protection: None  Other Topics Concern  . Not on file  Social History Narrative  . Not on file   Social Determinants of Health   Financial Resource Strain: Not on file  Food Insecurity: Not on file  Transportation Needs: Not on file  Physical Activity: Not on file  Stress: Not on file  Social Connections: Not on file  Intimate Partner Violence: Not on file    Allergies:  Allergies  Allergen Reactions  . Lexapro [Escitalopram Oxalate] Nausea Only  . Hydrocodone Rash  . Vicodin [Hydrocodone-Acetaminophen] Rash    Medications: Prior to Admission medications   Medication Sig Start Date End Date Taking? Authorizing Provider  albuterol (PROAIR HFA) 108 (90 Base) MCG/ACT inhaler Inhale 2 puffs into the lungs every 4 (four) hours as needed for wheezing or shortness of breath. 06/06/20   Alba Cory, MD  amphetamine-dextroamphetamine (ADDERALL XR) 25 MG 24 hr capsule Take 1 capsule by mouth every morning. 09/04/20   Alba Cory, MD  amphetamine-dextroamphetamine (ADDERALL XR) 25 MG 24 hr capsule Take 1 capsule by mouth every morning. 09/04/20   Alba Cory, MD  amphetamine-dextroamphetamine (ADDERALL XR) 25 MG 24 hr capsule Take 1 capsule by mouth every morning. 09/04/20   Alba Cory, MD  amphetamine-dextroamphetamine (ADDERALL) 10 MG tablet Take 1 tablet (10 mg total) by mouth daily. 09/04/20   Alba Cory, MD  amphetamine-dextroamphetamine (ADDERALL) 10 MG tablet Take 1 tablet (10 mg total) by mouth 2 (two) times daily. Fill 10/02/2020 09/04/20   Alba Cory, MD  amphetamine-dextroamphetamine (ADDERALL) 10 MG tablet Take 1 tablet (10 mg  total) by mouth 2 (two) times daily. 09/04/20   Alba Cory, MD  Cholecalciferol (VITAMIN D) 50 MCG (2000 UT) CAPS Take 1 capsule by mouth daily.    [provider]  Rimegepant Sulfate (NURTEC) 75 MG TBDP Take 1 tablet by mouth daily as needed. 09/04/20   Alba Cory, MD  rizatriptan (MAXALT-MLT) 10 MG disintegrating tablet Take 1 tablet (10 mg total) by mouth as needed for migraine. May repeat in 2 hours if needed 12/27/19   Alba Cory, MD    Physical Exam Vitals: There were no vitals taken for this visit.  General: NAD HEENT: normocephalic, anicteric Pulmonary: No increased work of breathing Abdomen: soft, non-tender, non-distended.  Genitourinary:  External: Normal external female genitalia.  Normal urethral meatus, normal  Bartholin's and Skene's glands.    Vagina: Normal vaginal mucosa, no evidence of prolapse.    Cervix: Grossly  normal in appearance, no bleeding  Uterus:  Non-enlarged, mobile, normal contour.  No CMT  Adnexa: ovaries non-enlarged, no adnexal masses  Rectal: deferred Extremities: no edema, erythema, or tenderness Neurologic: Grossly intact Psychiatric: mood appropriate, affect full   Assessment: 35 y.o. G1P0010 at Unknown presenting to initiate prenatal care  Plan: 1) Avoid alcoholic beverages. 2) Patient encouraged not to smoke.  3) Discontinue the use of all non-medicinal drugs and chemicals.  4) Take prenatal vitamins daily.  5) Nutrition, food safety (fish, cheese advisories, and high nitrite foods) and exercise discussed. 6) Hospital and practice style discussed with cross coverage system.  7) Genetic Screening, such as with 1st Trimester Screening, cell free fetal DNA, AFP testing, and Ultrasound, as well as with amniocentesis and CVS as appropriate, is discussed with patient. At the conclusion of today's visit patient requested genetic testing 8) Dating scan next visit   Vena [place], MD, [facility], [facility] 10/31/2020, 8:36 AM

## 2020-10-31 NOTE — Progress Notes (Signed)
NOB - no concerns. RM 5 °

## 2020-11-01 LAB — RPR+RH+ABO+RUB AB+AB SCR+CB...
Antibody Screen: NEGATIVE
HIV Screen 4th Generation wRfx: NONREACTIVE
Hematocrit: 38.7 % (ref 34.0–46.6)
Hemoglobin: 13.1 g/dL (ref 11.1–15.9)
Hepatitis B Surface Ag: NEGATIVE
MCH: 29.9 pg (ref 26.6–33.0)
MCHC: 33.9 g/dL (ref 31.5–35.7)
MCV: 88 fL (ref 79–97)
Platelets: 373 10*3/uL (ref 150–450)
RBC: 4.38 x10E6/uL (ref 3.77–5.28)
RDW: 12.9 % (ref 11.7–15.4)
RPR Ser Ql: NONREACTIVE
Rh Factor: POSITIVE
Rubella Antibodies, IGG: 1.64 index (ref >0.99)
Varicella zoster IgG: 1064 index (ref >165)
WBC: 11.4 10*3/uL — ABNORMAL HIGH (ref 3.4–10.8)

## 2020-11-02 LAB — CERVICOVAGINAL ANCILLARY ONLY
Chlamydia: NEGATIVE
Comment: NEGATIVE
Comment: NORMAL
Neisseria Gonorrhea: NEGATIVE

## 2020-11-03 LAB — URINE CULTURE

## 2020-11-07 ENCOUNTER — Ambulatory Visit (INDEPENDENT_AMBULATORY_CARE_PROVIDER_SITE_OTHER): Payer: BC Managed Care – PPO | Admitting: Obstetrics and Gynecology

## 2020-11-07 ENCOUNTER — Other Ambulatory Visit: Payer: Self-pay

## 2020-11-07 VITALS — BP 118/78 | Wt 133.0 lb

## 2020-11-07 DIAGNOSIS — Z3400 Encounter for supervision of normal first pregnancy, unspecified trimester: Secondary | ICD-10-CM

## 2020-11-07 DIAGNOSIS — Z1379 Encounter for other screening for genetic and chromosomal anomalies: Secondary | ICD-10-CM

## 2020-11-07 DIAGNOSIS — Z3A09 9 weeks gestation of pregnancy: Secondary | ICD-10-CM

## 2020-11-07 DIAGNOSIS — Z369 Encounter for antenatal screening, unspecified: Secondary | ICD-10-CM

## 2020-11-07 DIAGNOSIS — Z31438 Encounter for other genetic testing of female for procreative management: Secondary | ICD-10-CM

## 2020-11-07 LAB — POCT URINALYSIS DIPSTICK OB
Glucose, UA: NEGATIVE
POC,PROTEIN,UA: NEGATIVE

## 2020-11-07 NOTE — Progress Notes (Signed)
ROB - dating scan, no concerns. Procedure RM  

## 2020-11-07 NOTE — Progress Notes (Signed)
Dating Scan  Singleton IUP [redacted]w[redacted]d by CRL  2.76cm (2.86cm, 2.67cm, 2.75cm) giving [place] derived EDD of 06/08/2021 consistent with LMP dating of 06/12/2021  FHT 173 BPM YS 0.37cm ROV visualized LOV visualzed containing corpus luteum cyst No evidence of uterine fibroids No free fluid  There is a viable singleton gestation.  The fetal biometry correlates with established dating. Detailed evaluation of the fetal anatomy is precluded by early gestational age.  It must be noted that a normal ultrasound particular at this early gestational age is unable to rule out fetal aneuploidy, risk of first trimester miscarriage, or anatomic birth defects.  Vena [place], MD, [facility], [facility]

## 2020-11-07 NOTE — Progress Notes (Signed)
Routine Prenatal Care Visit  Subjective  Mary Kramer is a 35 y.o. G2P0010 at [redacted]w[redacted]d being seen today for ongoing prenatal care.  She is currently monitored for the following issues for this low-risk pregnancy and has Lump or mass in breast; ADHD (attention deficit hyperactivity disorder); Hypoglycemia; Migraine without aura; Insomnia; Vitamin D deficiency; Controlled substance agreement signed; Asthma, stable, mild intermittent; Supervision of normal first pregnancy, antepartum; Antepartum multigravida of advanced maternal age; and Carrier of genetic disorder on their problem list.  ----------------------------------------------------------------------------------- Patient reports nausea.   Contractions: Not present. Vag. Bleeding: None.  Movement: Absent. Denies leaking of fluid.  ----------------------------------------------------------------------------------- The following portions of the patient's history were reviewed and updated as appropriate: allergies, current medications, past family history, past medical history, past social history, past surgical history and problem list. Problem list updated.   Objective  Blood pressure 118/78, weight 133 lb (60.3 kg), last menstrual period 09/05/2020. Pregravid weight 126 lb (57.2 kg) Total Weight Gain 7 lb (3.175 kg) Urinalysis:      Fetal Status: Fetal Heart Rate (bpm): 173   Movement: Absent     General:  Alert, oriented and cooperative. Patient is in no acute distress.  Skin: Skin is warm and dry. No rash noted.   Cardiovascular: Normal heart rate noted  Respiratory: Normal respiratory effort, no problems with respiration noted  Abdomen: Soft, gravid, appropriate for gestational age. Pain/Pressure: Absent     Pelvic:  Cervical exam deferred        Extremities: Normal range of motion.     ental Status: Normal mood and affect. Normal behavior. Normal judgment and thought content.     Assessment   35 y.o. G2P0010 at [redacted]w[redacted]d by   06/12/2021, by Last Menstrual Period presenting for routine prenatal visit  Plan   Pregnancy 1 Problems (from 10/31/20 to present)     Problem Noted Resolved   Supervision of normal first pregnancy, antepartum 10/31/2020 by Vena [place], MD No   Overview Addendum 11/07/2020 10:01 AM by Vena [place], MD    [facility] Prenatal Labs  Dating LMP = 9 week [place] Blood type: A/Positive/-- (06/08 1127)   Genetic Screen 1 Screen:    AFP:     Quad:     NIPS: Antibody:Negative (06/08 1127)  Anatomic [place]  Rubella: 1.64 (06/08 1127) Varicella: Immune  GTT Early:               Third trimester:  RPR: Non Reactive (06/08 1127)   Rhogam  HBsAg: Negative (06/08 1127)   TDaP vaccine                       Flu Shot: HIV: Non Reactive (06/08 1127)   Baby Food                                GBS:   Contraception  Pap: 07/28/2019 NILM HPV negative  CBB     CS/VBAC    Support Person             Antepartum multigravida of advanced maternal age 73/12/2020 by Vena [place], MD No   Carrier of genetic disorder 10/31/2020 by Vena [place], MD No   Overview Signed 10/31/2020 12:44 PM by Vena [place], MD    SMA carrier significant other tested negative              Gestational age appropriate obstetric precautions including but  not limited to vaginal bleeding, contractions, leaking of fluid and fetal movement were reviewed in detail with the patient.    1) Dating - S=D  2) Genetics - inheritest and MaterniT21 today  Return in about 4 weeks (around 12/05/2020) for ROB.  Vena [place], MD, [facility], [facility] 11/07/2020, 10:01 AM

## 2020-11-09 DIAGNOSIS — Z3A09 9 weeks gestation of pregnancy: Secondary | ICD-10-CM | POA: Diagnosis not present

## 2020-11-09 DIAGNOSIS — Z3400 Encounter for supervision of normal first pregnancy, unspecified trimester: Secondary | ICD-10-CM | POA: Diagnosis not present

## 2020-11-09 DIAGNOSIS — Z31438 Encounter for other genetic testing of female for procreative management: Secondary | ICD-10-CM | POA: Diagnosis not present

## 2020-11-09 DIAGNOSIS — Z369 Encounter for antenatal screening, unspecified: Secondary | ICD-10-CM | POA: Diagnosis not present

## 2020-11-21 LAB — MATERNIT 21 PLUS CORE, BLOOD
Fetal Fraction: 7
Result (T21): NEGATIVE
Trisomy 13 (Patau syndrome): NEGATIVE
Trisomy 18 (Edwards syndrome): NEGATIVE
Trisomy 21 (Down syndrome): NEGATIVE

## 2020-11-21 LAB — INHERITEST CORE(CF97,SMA,FRAX)

## 2020-11-22 NOTE — Telephone Encounter (Signed)
Victorino Dike c LC calling to be sure we recv'd results of Inheritest Core Plus; if not to call [phone] and ask for customer service; we have results therefore, [facility] was not called.

## 2020-11-29 ENCOUNTER — Other Ambulatory Visit: Payer: Self-pay | Admitting: Obstetrics and Gynecology

## 2020-11-29 ENCOUNTER — Telehealth: Payer: Self-pay

## 2020-11-29 DIAGNOSIS — O2341 Unspecified infection of urinary tract in pregnancy, first trimester: Secondary | ICD-10-CM

## 2020-11-29 MED ORDER — CEPHALEXIN 500 MG PO CAPS: 500.0000 mg | ORAL_CAPSULE | Freq: Four times a day (QID) | ORAL | 0 refills | Status: AC

## 2020-11-29 NOTE — Telephone Encounter (Signed)
Copied from CRM [id number]. Topic: General - Other >> Nov 29, 2020 12:08 PM Pawlus, Maxine Glenn A wrote: Reason for CRM: [employer] was following up on a PA they sent over a few days for Nurtec, please advise if you have received this.

## 2020-11-30 NOTE — Telephone Encounter (Signed)
Looks like she is pregnant can she be on this? And I do not see on med list?

## 2020-12-03 NOTE — Telephone Encounter (Signed)
Called patient she is not going to take due to being pregnant

## 2020-12-03 NOTE — Telephone Encounter (Signed)
Pt calling; sent MyChart msg on Fri; is having a stretching/sharp pain in front of her private area that comes and goes; no spotting. [phone]  Adv pt probably round ligament pain; can take two extra strength tylenol every 6hrs while awake and apply heat to area but not sleep with the heat.  Pt also concerned about cramping; adv cramping is normal through out the whole preg as long as it doesn't stop her in her tracks or double her over.  Pt reassured.  Has been trying for 27yrs to conceive and wants things to go well.  Adv to call anytime as that's what we are here for.

## 2020-12-05 ENCOUNTER — Other Ambulatory Visit: Payer: Self-pay

## 2020-12-05 ENCOUNTER — Ambulatory Visit (INDEPENDENT_AMBULATORY_CARE_PROVIDER_SITE_OTHER): Payer: BC Managed Care – PPO | Admitting: Obstetrics and Gynecology

## 2020-12-05 VITALS — BP 121/80 | Wt 138.0 lb

## 2020-12-05 DIAGNOSIS — Z3A13 13 weeks gestation of pregnancy: Secondary | ICD-10-CM

## 2020-12-05 DIAGNOSIS — O09529 Supervision of elderly multigravida, unspecified trimester: Secondary | ICD-10-CM

## 2020-12-05 DIAGNOSIS — Z363 Encounter for antenatal screening for malformations: Secondary | ICD-10-CM

## 2020-12-05 DIAGNOSIS — Z9889 Other specified postprocedural states: Secondary | ICD-10-CM

## 2020-12-05 DIAGNOSIS — Z3400 Encounter for supervision of normal first pregnancy, unspecified trimester: Secondary | ICD-10-CM

## 2020-12-05 DIAGNOSIS — O3440 Maternal care for other abnormalities of cervix, unspecified trimester: Secondary | ICD-10-CM

## 2020-12-05 LAB — POCT URINALYSIS DIPSTICK OB
Glucose, UA: NEGATIVE
POC,PROTEIN,UA: NEGATIVE

## 2020-12-05 NOTE — Progress Notes (Signed)
ROB - bedside US, no concerns. RM 2

## 2020-12-05 NOTE — Progress Notes (Signed)
Routine Prenatal Care Visit  Subjective  Mary Kramer is a 35 y.o. G2P0010 at [redacted]w[redacted]d being seen today for ongoing prenatal care.  She is currently monitored for the following issues for this low-risk pregnancy and has Lump or mass in breast; ADHD (attention deficit hyperactivity disorder); Hypoglycemia; Migraine without aura; Insomnia; Vitamin D deficiency; Controlled substance agreement signed; Asthma, stable, mild intermittent; Supervision of normal first pregnancy, antepartum; Antepartum multigravida of advanced maternal age; Carrier of genetic disorder; and History of loop electrosurgical excision procedure (LEEP) of cervix affecting pregnancy, antepartum on their problem list.  ----------------------------------------------------------------------------------- Patient reports no complaints.  Having some more migraine Contractions: Not present. Vag. Bleeding: None.  Movement: Absent. Denies leaking of fluid.  ----------------------------------------------------------------------------------- The following portions of the patient's history were reviewed and updated as appropriate: allergies, current medications, past family history, past medical history, past social history, past surgical history and problem list. Problem list updated.   Objective  Blood pressure 121/80, weight 138 lb (62.6 kg), last menstrual period 09/05/2020. Pregravid weight 126 lb (57.2 kg) Total Weight Gain 12 lb (5.443 kg) Urinalysis:      Fetal Status: Fetal Heart Rate (bpm): 150   Movement: Absent     General:  Alert, oriented and cooperative. Patient is in no acute distress.  Skin: Skin is warm and dry. No rash noted.   Cardiovascular: Normal heart rate noted  Respiratory: Normal respiratory effort, no problems with respiration noted  Abdomen: Soft, gravid, appropriate for gestational age. Pain/Pressure: Absent     Pelvic:  Cervical exam deferred        Extremities: Normal range of motion.     ental  Status: Normal mood and affect. Normal behavior. Normal judgment and thought content.     Assessment   35 y.o. G2P0010 at [redacted]w[redacted]d by  06/12/2021, by Last Menstrual Period presenting for routine prenatal visit  Plan   Pregnancy 1 Problems (from 10/31/20 to present)     Problem Noted Resolved   Supervision of normal first pregnancy, antepartum 10/31/2020 by Vena Austria, MD No   Overview Addendum 11/07/2020 10:01 AM by Vena Austria, MD    [facility] Prenatal Labs  Dating LMP = 9 week [place] Blood type: A/Positive/-- (06/08 1127)   Genetic Screen 1 Screen:    AFP:     Quad:     NIPS: Antibody:Negative (06/08 1127)  Anatomic [place]  Rubella: 1.64 (06/08 1127) Varicella: Immune  GTT Early:               Third trimester:  RPR: Non Reactive (06/08 1127)   Rhogam  HBsAg: Negative (06/08 1127)   TDaP vaccine                       Flu Shot: HIV: Non Reactive (06/08 1127)   Baby Food                                GBS:   Contraception  Pap: 07/28/2019 NILM HPV negative  CBB     CS/VBAC    Support Person             Antepartum multigravida of advanced maternal age 55/12/2020 by Vena Austria, MD No   Overview Signed 11/22/2020 12:30 PM by Vena Austria, MD     Nursing Staff Provider  Office Location  [facility] Dating  LMP = 9 week [place]  Language  English Anatomy US  Flu Vaccine   Genetic Screen  NIPS: Normal XY  TDaP vaccine    Hgb A1C or  GTT  Third trimester :   Covid    LAB RESULTS   Rhogam   Blood Type   A pos  Feeding Plan  Antibody  negative  Contraception  Rubella  immune  Circumcision  RPR   NR  Pediatrician   HBsAg   negative  Support Person Husband Mellody Dance HIV  negative  Prenatal Classes  Varicella Immune    GBS  (For PCN allergy, check sensitivities)   BTL Consent     VBAC Consent  Pap      Hgb Electro      CF negative     SMA Positive (FOB negative)             Carrier of genetic disorder 10/31/2020 by Vena Austria, MD No   Overview Signed 10/31/2020  12:44 PM by Vena Austria, MD    SMA carrier significant other tested negative            Gestational age appropriate obstetric precautions including but not limited to vaginal bleeding, contractions, leaking of fluid and fetal movement were reviewed in detail with the patient.    - ordered anatomy scan - history of LEEP cervical length measurement - Rx compazine for headaches  Return in about 4 weeks (around 01/02/2021) for ROB.  Vena Austria, MD, Evern Core [facility], [facility] 12/05/2020, 9:05 AM

## 2020-12-06 DIAGNOSIS — Z9889 Other specified postprocedural states: Secondary | ICD-10-CM | POA: Insufficient documentation

## 2020-12-06 DIAGNOSIS — O3440 Maternal care for other abnormalities of cervix, unspecified trimester: Secondary | ICD-10-CM | POA: Insufficient documentation

## 2020-12-06 MED ORDER — PROCHLORPERAZINE MALEATE 10 MG PO TABS: 10.0000 mg | ORAL_TABLET | Freq: Four times a day (QID) | ORAL | 0 refills | Status: DC | PRN

## 2020-12-14 ENCOUNTER — Other Ambulatory Visit: Payer: Self-pay | Admitting: Obstetrics and Gynecology

## 2020-12-14 DIAGNOSIS — Z3400 Encounter for supervision of normal first pregnancy, unspecified trimester: Secondary | ICD-10-CM

## 2020-12-14 DIAGNOSIS — Z3686 Encounter for antenatal screening for cervical length: Secondary | ICD-10-CM

## 2020-12-14 DIAGNOSIS — Z9889 Other specified postprocedural states: Secondary | ICD-10-CM

## 2020-12-17 ENCOUNTER — Ambulatory Visit: Payer: BC Managed Care – PPO

## 2020-12-18 ENCOUNTER — Telehealth: Payer: Self-pay | Admitting: Family Medicine

## 2020-12-18 NOTE — Telephone Encounter (Signed)
Cover my meds called to give a fax# to resubmit request for appeal for Nurtec 75MG  /please refax appeal to [phone]/ she stated that insurance advised they have never received appeal   Ref# [id number] if office needs to call cover my meds back

## 2020-12-19 ENCOUNTER — Ambulatory Visit: Admission: RE | Admit: 2020-12-19 | Payer: BC Managed Care – PPO | Source: Ambulatory Visit

## 2020-12-20 ENCOUNTER — Encounter: Payer: Self-pay | Admitting: Obstetrics and Gynecology

## 2020-12-20 ENCOUNTER — Telehealth: Payer: Self-pay

## 2020-12-20 ENCOUNTER — Other Ambulatory Visit: Payer: Self-pay

## 2020-12-20 ENCOUNTER — Ambulatory Visit (INDEPENDENT_AMBULATORY_CARE_PROVIDER_SITE_OTHER): Payer: BC Managed Care – PPO | Admitting: Obstetrics and Gynecology

## 2020-12-20 VITALS — BP 110/70 | Ht 62.0 in | Wt 141.6 lb

## 2020-12-20 DIAGNOSIS — Z3400 Encounter for supervision of normal first pregnancy, unspecified trimester: Secondary | ICD-10-CM

## 2020-12-20 DIAGNOSIS — Z3A15 15 weeks gestation of pregnancy: Secondary | ICD-10-CM

## 2020-12-20 LAB — POCT URINALYSIS DIPSTICK OB
Glucose, UA: NEGATIVE
POC,PROTEIN,UA: NEGATIVE

## 2020-12-20 NOTE — Progress Notes (Signed)
Routine Prenatal Care Visit  Subjective  Mary Kramer is a 35 y.o. G2P0010 at [redacted]w[redacted]d being seen today for ongoing prenatal care.  She is currently monitored for the following issues for this low-risk pregnancy and has Lump or mass in breast; ADHD (attention deficit hyperactivity disorder); Hypoglycemia; Migraine without aura; Insomnia; Vitamin D deficiency; Controlled substance agreement signed; Asthma, stable, mild intermittent; Supervision of normal first pregnancy, antepartum; Antepartum multigravida of advanced maternal age; Carrier of genetic disorder; and History of loop electrosurgical excision procedure (LEEP) of cervix affecting pregnancy, antepartum on their problem list.  ----------------------------------------------------------------------------------- Patient reports that she fell and did a belly flop on her belly yesterday when moving furniture.  She had a little bit of cramping for a few hours after the fall which is now resolved.  She has not had any heavy vaginal bleeding. Contractions: Not present. Vag. Bleeding: None.  Movement: Absent. Denies leaking of fluid.  ----------------------------------------------------------------------------------- The following portions of the patient's history were reviewed and updated as appropriate: allergies, current medications, past family history, past medical history, past social history, past surgical history and problem list. Problem list updated.   Objective  Blood pressure 110/70, height 5\' 2"  (1.575 m), weight 141 lb 9.6 oz (64.2 kg), last menstrual period 09/05/2020. Pregravid weight 126 lb (57.2 kg) Total Weight Gain 15 lb 9.6 oz (7.076 kg) Urinalysis:      Fetal Status: Fetal Heart Rate (bpm): 156   Movement: Absent     General:  Alert, oriented and cooperative. Patient is in no acute distress.  Skin: Skin is warm and dry. No rash noted.   Cardiovascular: Normal heart rate noted  Respiratory: Normal respiratory  effort, no problems with respiration noted  Abdomen: Soft, gravid, appropriate for gestational age. Pain/Pressure: Absent     Pelvic:  Cervical exam deferred        Extremities: Normal range of motion.     Mental Status: Normal mood and affect. Normal behavior. Normal judgment and thought content.     Assessment   35 y.o. G2P0010 at [redacted]w[redacted]d by  06/12/2021, by Last Menstrual Period presenting for work-in prenatal visit  Plan   Pregnancy 1 Problems (from 10/31/20 to present)     Problem Noted Resolved   Supervision of normal first pregnancy, antepartum 10/31/2020 by 12/31/2020, MD No   Overview Addendum 11/07/2020 10:01 AM by 11/09/2020, MD    [facility] Prenatal Labs  Dating LMP = 9 week Vena Austria Blood type: A/Positive/-- (06/08 1127)   Genetic Screen 1 Screen:    AFP:     Quad:     NIPS: Antibody:Negative (06/08 1127)  Anatomic 12-29-1988  Rubella: 1.64 (06/08 1127) Varicella: Immune  GTT Early:               Third trimester:  RPR: Non Reactive (06/08 1127)   Rhogam  HBsAg: Negative (06/08 1127)   TDaP vaccine                       Flu Shot: HIV: Non Reactive (06/08 1127)   Baby Food                                GBS:   Contraception  Pap: 07/28/2019 NILM HPV negative  CBB     CS/VBAC    Support Person            Antepartum multigravida of advanced  maternal age 61/12/2020 by Vena Austria, MD No   Overview Signed 11/22/2020 12:30 PM by Vena Austria, MD     Nursing Staff Provider  Office Location  [facility] Dating  LMP = 9 week [place]  Language  English Anatomy US    Flu Vaccine   Genetic Screen  NIPS: Normal XY  TDaP vaccine    Hgb A1C or  GTT  Third trimester :   Covid    LAB RESULTS   Rhogam   Blood Type   A pos  Feeding Plan  Antibody  negative  Contraception  Rubella  immune  Circumcision  RPR   NR  Pediatrician   HBsAg   negative  Support Person Husband Mellody Dance HIV  negative  Prenatal Classes  Varicella Immune    GBS  (For PCN allergy, check sensitivities)    BTL Consent     VBAC Consent  Pap      Hgb Electro      CF negative     SMA Positive (FOB negative)            Carrier of genetic disorder 10/31/2020 by Vena Austria, MD No   Overview Signed 10/31/2020 12:44 PM by Vena Austria, MD    SMA carrier significant other tested negative           Reassurance regarding fall.  Gestational age appropriate obstetric precautions including but not limited to vaginal bleeding, contractions, leaking of fluid and fetal movement were reviewed in detail with the patient.    Return for ROB 1-2 weeks, can double book with me if needed.  Natale Milch MD [facility], [facility] 12/20/2020, 2:46 PM

## 2020-12-26 ENCOUNTER — Other Ambulatory Visit: Payer: Self-pay

## 2020-12-26 ENCOUNTER — Encounter: Payer: Self-pay | Admitting: Obstetrics and Gynecology

## 2020-12-26 ENCOUNTER — Ambulatory Visit (INDEPENDENT_AMBULATORY_CARE_PROVIDER_SITE_OTHER): Payer: BC Managed Care – PPO | Admitting: Obstetrics and Gynecology

## 2020-12-26 VITALS — BP 110/70 | Ht 62.0 in | Wt 145.6 lb

## 2020-12-26 DIAGNOSIS — Z3400 Encounter for supervision of normal first pregnancy, unspecified trimester: Secondary | ICD-10-CM

## 2020-12-26 DIAGNOSIS — Z3A16 16 weeks gestation of pregnancy: Secondary | ICD-10-CM

## 2020-12-26 DIAGNOSIS — O09529 Supervision of elderly multigravida, unspecified trimester: Secondary | ICD-10-CM

## 2020-12-26 LAB — POCT URINALYSIS DIPSTICK OB
Glucose, UA: NEGATIVE
POC,PROTEIN,UA: NEGATIVE

## 2020-12-26 NOTE — Progress Notes (Signed)
Routine Prenatal Care Visit  Subjective  Mary Kramer is a 35 y.o. G2P0010 at [redacted]w[redacted]d being seen today for ongoing prenatal care.  She is currently monitored for the following issues for this low-risk pregnancy and has Lump or mass in breast; ADHD (attention deficit hyperactivity disorder); Hypoglycemia; Migraine without aura; Insomnia; Vitamin D deficiency; Controlled substance agreement signed; Asthma, stable, mild intermittent; Supervision of normal first pregnancy, antepartum; Antepartum multigravida of advanced maternal age; Carrier of genetic disorder; and History of loop electrosurgical excision procedure (LEEP) of cervix affecting pregnancy, antepartum on their problem list.  ----------------------------------------------------------------------------------- Patient reports no complaints.   Contractions: Not present. Vag. Bleeding: None.  Movement: Absent. Denies leaking of fluid.  ----------------------------------------------------------------------------------- The following portions of the patient's history were reviewed and updated as appropriate: allergies, current medications, past family history, past medical history, past social history, past surgical history and problem list. Problem list updated.   Objective  Blood pressure 110/70, height 5\' 2"  (1.575 m), weight 145 lb 9.6 oz (66 kg), last menstrual period 09/05/2020. Pregravid weight 126 lb (57.2 kg) Total Weight Gain 19 lb 9.6 oz (8.891 kg) Urinalysis:      Fetal Status: Fetal Heart Rate (bpm): 160   Movement: Absent     General:  Alert, oriented and cooperative. Patient is in no acute distress.  Skin: Skin is warm and dry. No rash noted.   Cardiovascular: Normal heart rate noted  Respiratory: Normal respiratory effort, no problems with respiration noted  Abdomen: Soft, gravid, appropriate for gestational age. Pain/Pressure: Absent     Pelvic:  Cervical exam deferred        Extremities: Normal range of motion.   Edema: None  Mental Status: Normal mood and affect. Normal behavior. Normal judgment and thought content.     Assessment   35 y.o. G2P0010 at [redacted]w[redacted]d by  06/12/2021, by Last Menstrual Period presenting for routine prenatal visit  Plan   Pregnancy 1 Problems (from 10/31/20 to present)     Problem Noted Resolved   Supervision of normal first pregnancy, antepartum 10/31/2020 by 12/31/2020, MD No   Overview Addendum 12/26/2020 11:23 AM by 02/25/2021, MD     Nursing Staff Provider  Office Location  [facility] Dating  LMP = 9 week Natale Milch  Language  English Anatomy US    Flu Vaccine   Genetic Screen  NIPS: Normal XY  TDaP vaccine    Hgb A1C or  GTT  Third trimester :   Covid    LAB RESULTS   Rhogam  Not needed Blood Type   A pos  Feeding Plan  Antibody  negative  Contraception  Rubella  immune  Circumcision  RPR   NR  Pediatrician   HBsAg   negative  Support Person Husband [place] HIV  negative  Prenatal Classes  Varicella Immune    GBS  (For PCN allergy, check sensitivities)   BTL Consent     VBAC Consent  Pap  202 NIL HPV neg    Hgb Electro      CF negative     SMA Positive (FOB negative)            Antepartum multigravida of advanced maternal age 33/12/2020 by 12/31/2020, MD No   Carrier of genetic disorder 10/31/2020 by 12/31/2020, MD No   Overview Signed 10/31/2020 12:44 PM by 12/31/2020, MD    SMA carrier significant other tested negative           Gestational  age appropriate obstetric precautions including but not limited to vaginal bleeding, contractions, leaking of fluid and fetal movement were reviewed in detail with the patient.    Return in about 2 weeks (around 01/09/2021) for ROB as planned.  Natale Milch MD [facility], [facility] 12/26/2020, 11:22 AM

## 2020-12-26 NOTE — Addendum Note (Signed)
Addended by: Clement Husbands A on: 12/26/2020 11:28 AM   Modules accepted: Orders

## 2020-12-26 NOTE — Patient Instructions (Signed)

## 2021-01-09 ENCOUNTER — Other Ambulatory Visit: Payer: Self-pay

## 2021-01-09 ENCOUNTER — Ambulatory Visit (INDEPENDENT_AMBULATORY_CARE_PROVIDER_SITE_OTHER): Payer: BC Managed Care – PPO | Admitting: Obstetrics and Gynecology

## 2021-01-09 VITALS — BP 113/70 | Wt 148.0 lb

## 2021-01-09 DIAGNOSIS — Z3400 Encounter for supervision of normal first pregnancy, unspecified trimester: Secondary | ICD-10-CM

## 2021-01-09 LAB — POCT URINALYSIS DIPSTICK OB
Glucose, UA: NEGATIVE
POC,PROTEIN,UA: NEGATIVE

## 2021-01-09 NOTE — Progress Notes (Signed)
ROB - no concerns. RM 2 

## 2021-01-09 NOTE — Progress Notes (Signed)
Routine Prenatal Care Visit  Subjective  Mary Kramer is a 35 y.o. G2P0010 at [redacted]w[redacted]d being seen today for ongoing prenatal care.  She is currently monitored for the following issues for this high-risk pregnancy and has Lump or mass in breast; ADHD (attention deficit hyperactivity disorder); Hypoglycemia; Migraine without aura; Insomnia; Vitamin D deficiency; Controlled substance agreement signed; Asthma, stable, mild intermittent; Supervision of normal first pregnancy, antepartum; Antepartum multigravida of advanced maternal age; Carrier of genetic disorder; and History of loop electrosurgical excision procedure (LEEP) of cervix affecting pregnancy, antepartum on their problem list.  ----------------------------------------------------------------------------------- Patient reports no complaints.   Contractions: Not present. Vag. Bleeding: None.  Movement: Present. Denies leaking of fluid.  ----------------------------------------------------------------------------------- The following portions of the patient's history were reviewed and updated as appropriate: allergies, current medications, past family history, past medical history, past social history, past surgical history and problem list. Problem list updated.   Objective  Blood pressure 113/70, weight 148 lb (67.1 kg), last menstrual period 09/05/2020. Pregravid weight 126 lb (57.2 kg) Total Weight Gain 22 lb (9.979 kg) Urinalysis:      Fetal Status: Fetal Heart Rate (bpm): 145   Movement: Present     General:  Alert, oriented and cooperative. Patient is in no acute distress.  Skin: Skin is warm and dry. No rash noted.   Cardiovascular: Normal heart rate noted  Respiratory: Normal respiratory effort, no problems with respiration noted  Abdomen: Soft, gravid, appropriate for gestational age. Pain/Pressure: Absent     Pelvic:  Cervical exam deferred        Extremities: Normal range of motion.     ental Status: Normal mood  and affect. Normal behavior. Normal judgment and thought content.     Assessment   35 y.o. G2P0010 at [redacted]w[redacted]d by  06/12/2021, by Last Menstrual Period presenting for routine prenatal visit  Plan   Pregnancy 1 Problems (from 10/31/20 to present)     Problem Noted Resolved   Supervision of normal first pregnancy, antepartum 10/31/2020 by Vena Austria, MD No   Overview Addendum 12/26/2020 11:23 AM by Natale Milch, MD     Nursing Staff Provider  Office Location  [facility] Dating  LMP = 9 week [place]  Language  English Anatomy US    Flu Vaccine   Genetic Screen  NIPS: Normal XY  TDaP vaccine    Hgb A1C or  GTT  Third trimester :   Covid    LAB RESULTS   Rhogam  Not needed Blood Type   A pos  Feeding Plan  Antibody  negative  Contraception  Rubella  immune  Circumcision  RPR   NR  Pediatrician   HBsAg   negative  Support Person Husband Mellody Dance HIV  negative  Prenatal Classes  Varicella Immune    GBS  (For PCN allergy, check sensitivities)   BTL Consent     VBAC Consent  Pap  202 NIL HPV neg    Hgb Electro      CF negative     SMA Positive (FOB negative)            Antepartum multigravida of advanced maternal age 43/12/2020 by Vena Austria, MD No   Carrier of genetic disorder 10/31/2020 by Vena Austria, MD No   Overview Signed 10/31/2020 12:44 PM by Vena Austria, MD    SMA carrier significant other tested negative           Gestational age appropriate obstetric precautions including but not limited to  vaginal bleeding, contractions, leaking of fluid and fetal movement were reviewed in detail with the patient.    - anatomy scan later this month  Return in about 4 weeks (around 02/06/2021) for ROB.  Vena Austria, MD, Evern Core [facility], [facility] 01/09/2021, 9:25 AM

## 2021-01-14 DIAGNOSIS — Z20822 Contact with and (suspected) exposure to covid-19: Secondary | ICD-10-CM | POA: Diagnosis not present

## 2021-01-18 ENCOUNTER — Other Ambulatory Visit: Payer: Self-pay

## 2021-01-18 ENCOUNTER — Ambulatory Visit
Admission: RE | Admit: 2021-01-18 | Discharge: 2021-01-18 | Disposition: A | Discharge status: Home / Self Care | Payer: BC Managed Care – PPO | Source: Ambulatory Visit | Attending: Obstetrics and Gynecology | Admitting: Obstetrics and Gynecology

## 2021-01-18 DIAGNOSIS — Z3A19 19 weeks gestation of pregnancy: Secondary | ICD-10-CM | POA: Diagnosis not present

## 2021-01-18 DIAGNOSIS — Z3400 Encounter for supervision of normal first pregnancy, unspecified trimester: Secondary | ICD-10-CM

## 2021-01-18 DIAGNOSIS — Z3689 Encounter for other specified antenatal screening: Secondary | ICD-10-CM | POA: Diagnosis not present

## 2021-01-18 DIAGNOSIS — Z363 Encounter for antenatal screening for malformations: Secondary | ICD-10-CM

## 2021-01-21 ENCOUNTER — Ambulatory Visit (INDEPENDENT_AMBULATORY_CARE_PROVIDER_SITE_OTHER): Payer: BC Managed Care – PPO | Admitting: Obstetrics & Gynecology

## 2021-01-21 ENCOUNTER — Other Ambulatory Visit: Payer: Self-pay

## 2021-01-21 ENCOUNTER — Encounter: Payer: Self-pay | Admitting: Obstetrics & Gynecology

## 2021-01-21 VITALS — BP 100/60 | Wt 152.0 lb

## 2021-01-21 DIAGNOSIS — Z3A19 19 weeks gestation of pregnancy: Secondary | ICD-10-CM

## 2021-01-21 DIAGNOSIS — Z3402 Encounter for supervision of normal first pregnancy, second trimester: Secondary | ICD-10-CM

## 2021-01-21 DIAGNOSIS — Z9889 Other specified postprocedural states: Secondary | ICD-10-CM

## 2021-01-21 DIAGNOSIS — O3440 Maternal care for other abnormalities of cervix, unspecified trimester: Secondary | ICD-10-CM

## 2021-01-21 DIAGNOSIS — O09529 Supervision of elderly multigravida, unspecified trimester: Secondary | ICD-10-CM

## 2021-01-21 NOTE — Patient Instructions (Signed)
Influenza (Flu) Vaccine (Inactivated or Recombinant): What You Need to Know 1. Why get vaccinated? Influenza vaccine can prevent influenza (flu). Flu is a contagious disease that spreads around the [place] every year, usually between October and May. Anyone can get the flu, but it is more dangerous for some people. Infants and young children, people 65 years and older, pregnant people, and people with certain health conditions or a weakened immune system are at greatest risk of flu complications. Pneumonia, bronchitis, sinus infections, and ear infections are examples of flu-related complications. If you have a medical condition, such as heart disease, cancer, or diabetes, flu can make it worse. Flu can cause fever and chills, sore throat, muscle aches, fatigue, cough, headache, and runny or stuffy nose. Some people may have vomiting and diarrhea, though this is more common in children than adults. In an average year, thousands of people in the [place] die from flu, and many more are hospitalized. Flu vaccine prevents millions of illnesses and flu-related visits to the doctor each year. 2. Influenza vaccines CDC recommends everyone 6 months and older get vaccinated every flu season. Children 6 months through 8 years of age may need 2 doses during a single flu season. Everyone else needs only 1 dose each flu season. It takes about 2 weeks for protection to develop after vaccination. There are many flu viruses, and they are always changing. Each year a new flu vaccine is made to protect against the influenza viruses believed to be likely to cause disease in the upcoming flu season. Even when the vaccine doesn't exactly match these viruses, it may still provide some protection. Influenza vaccine does not cause flu. Influenza vaccine may be given at the same time as other vaccines. 3. Talk with your health care provider Tell your vaccination provider if the person getting the vaccine: Has had  an allergic reaction after a previous dose of influenza vaccine, or has any severe, life-threatening allergies Has ever had Guillain-Barr Syndrome (also called "GBS") In some cases, your health care provider may decide to postpone influenza vaccination until a future visit. Influenza vaccine can be administered at any time during pregnancy. People who are or will be pregnant during influenza season should receive inactivated influenza vaccine. People with minor illnesses, such as a cold, may be vaccinated. People who are moderately or severely ill should usually wait until they recover before getting influenza vaccine. Your health care provider can give you more information. 4. Risks of a vaccine reaction Soreness, redness, and swelling where the shot is given, fever, muscle aches, and headache can happen after influenza vaccination. There may be a very small increased risk of Guillain-Barr Syndrome (GBS) after inactivated influenza vaccine (the flu shot). Young children who get the flu shot along with pneumococcal vaccine (PCV13) and/or DTaP vaccine at the same time might be slightly more likely to have a seizure caused by fever. Tell your health care provider if a child who is getting flu vaccine has ever had a seizure. People sometimes faint after medical procedures, including vaccination. Tell your provider if you feel dizzy or have vision changes or ringing in the ears. As with any medicine, there is a very remote chance of a vaccine causing a severe allergic reaction, other serious injury, or death. 5. What if there is a serious problem? An allergic reaction could occur after the vaccinated person leaves the clinic. If you see signs of a severe allergic reaction (hives, swelling of the face and throat, difficulty breathing,   a fast heartbeat, dizziness, or weakness), call 9-1-1 and get the person to the nearest hospital. For other signs that concern you, call your health care provider. Adverse  reactions should be reported to the Vaccine Adverse Event Reporting System (VAERS). Your health care provider will usually file this report, or you can do it yourself. Visit the VAERS website at www.vaers.hhs.gov or call 1-800-822-7967. VAERS is only for reporting reactions, and VAERS staff members do not give medical advice. 6. The National Vaccine Injury Compensation Program The National Vaccine Injury Compensation Program (VICP) is a federal program that was created to compensate people who may have been injured by certain vaccines. Claims regarding alleged injury or death due to vaccination have a time limit for filing, which may be as short as two years. Visit the VICP website at www.hrsa.gov/vaccinecompensation or call 1-800-338-2382 to learn about the program and about filing a claim. 7. How can I learn more? Ask your health care provider. Call your local or state health department. Visit the website of the Food and Drug Administration (FDA) for vaccine package inserts and additional information at www.fda.gov/vaccines-blood-biologics/vaccines. Contact the Centers for Disease Control and Prevention (CDC): Call 1-800-232-4636 (1-800-CDC-INFO) or Visit CDC's website at www.cdc.gov/flu. Vaccine Information Statement Inactivated Influenza Vaccine (12/30/2019) This information is not intended to replace advice given to you by your health care provider. Make sure you discuss any questions you have with your health care provider. Document Revised: 02/16/2020 Document Reviewed: 02/16/2020 Elsevier Patient Education  2022 [employer]  

## 2021-01-21 NOTE — Progress Notes (Signed)
  Subjective  Fetal Movement? yes Contractions? no Leaking Fluid? no Vaginal Bleeding? no  Objective  BP 100/60   Wt 152 lb (68.9 kg)   LMP 09/05/2020 (Exact Date)   BMI 27.80 kg/m  General: NAD Pumonary: no increased work of breathing Abdomen: gravid, non-tender Extremities: no edema Psychiatric: mood appropriate, affect full  Assessment  35 y.o. G2P0010 at [redacted]w[redacted]d by  06/12/2021, by Last Menstrual Period presenting for routine prenatal visit  Plan   Problem List Items Addressed This Visit      Genitourinary   History of loop electrosurgical excision procedure (LEEP) of cervix affecting pregnancy, antepartum     Other   Supervision of normal first pregnancy, antepartum - Primary   Antepartum multigravida of advanced maternal age  Other Visit Diagnoses    [redacted] weeks gestation of pregnancy        Review of ULTRASOUND. I have personally reviewed images and report of recent ultrasound done at [facility]. There is a singleton gestation with subjectively normal amniotic fluid volume. The fetal biometry correlates with established dating. Detailed evaluation of the fetal anatomy was performed.The fetal anatomical survey appears within normal limits within the resolution of ultrasound as described above.  It must be noted that a normal ultrasound is unable to rule out fetal aneuploidy.    PNV Flu shot advised, to consider Covid vacc UTD  Pregnancy 1 Problems (from 10/31/20 to present)    Problem Noted Resolved   Supervision of normal first pregnancy, antepartum 10/31/2020 by Vena Austria, MD No   Overview Addendum 01/21/2021  4:40 PM by Nadara Mustard, MD     Nursing Staff Provider  Office Location  [facility] Dating  LMP = 9 week [place]  Language  English Anatomy US  nml  Flu Vaccine   Genetic Screen  NIPS: Normal XY  TDaP vaccine    Hgb A1C or  GTT  Third trimester :   Covid    LAB RESULTS   Rhogam  Not needed Blood Type   A pos  Feeding Plan  Antibody  negative   Contraception  Rubella  immune  Circumcision  RPR   NR  Pediatrician   HBsAg   negative  Support Person Husband Mellody Dance HIV  negative  Prenatal Classes  Varicella Immune    GBS  (For PCN allergy, check sensitivities)   BTL Consent     VBAC Consent N/a Pap  202 NIL HPV neg    Hgb Electro      CF negative     SMA Positive (FOB negative)             Antepartum multigravida of advanced maternal age 79/12/2020 by Vena Austria, MD No   Carrier of genetic disorder 10/31/2020 by Vena Austria, MD No   Overview Signed 10/31/2020 12:44 PM by Vena Austria, MD    SMA carrier significant other tested negative          Annamarie Major, MD, [facility], [facility] 01/21/2021  4:52 PM

## 2021-02-04 ENCOUNTER — Telehealth: Payer: Self-pay

## 2021-02-04 NOTE — Telephone Encounter (Signed)
Pt calling; both hands are tingly and numb most of the day.  [phone]  Adv pt to get wrist splints and wear them esp at HS and may need to wear them 24/7.  Pt also c/o her right leg from hip to knee feels crampy; also feels numb/tingly from knees to feet; adv could be baby on nerve

## 2021-02-07 ENCOUNTER — Other Ambulatory Visit: Payer: Self-pay

## 2021-02-07 ENCOUNTER — Ambulatory Visit (INDEPENDENT_AMBULATORY_CARE_PROVIDER_SITE_OTHER): Payer: BC Managed Care – PPO | Admitting: Obstetrics and Gynecology

## 2021-02-07 VITALS — BP 112/65 | Wt 155.0 lb

## 2021-02-07 DIAGNOSIS — Z3400 Encounter for supervision of normal first pregnancy, unspecified trimester: Secondary | ICD-10-CM

## 2021-02-07 DIAGNOSIS — Z3A22 22 weeks gestation of pregnancy: Secondary | ICD-10-CM

## 2021-02-07 LAB — POCT URINALYSIS DIPSTICK OB
Glucose, UA: NEGATIVE
POC,PROTEIN,UA: NEGATIVE

## 2021-02-07 NOTE — Progress Notes (Signed)
Routine Prenatal Care Visit  Subjective  Mary Kramer is a 35 y.o. G2P0010 at [redacted]w[redacted]d being seen today for ongoing prenatal care.  She is currently monitored for the following issues for this low-risk pregnancy and has Lump or mass in breast; ADHD (attention deficit hyperactivity disorder); Hypoglycemia; Migraine without aura; Insomnia; Vitamin D deficiency; Controlled substance agreement signed; Asthma, stable, mild intermittent; Supervision of normal first pregnancy, antepartum; Antepartum multigravida of advanced maternal age; Carrier of genetic disorder; and History of loop electrosurgical excision procedure (LEEP) of cervix affecting pregnancy, antepartum on their problem list.  ----------------------------------------------------------------------------------- Patient reports no complaints.   Contractions: Not present. Vag. Bleeding: None.  Movement: Present. Denies leaking of fluid.  ----------------------------------------------------------------------------------- The following portions of the patient's history were reviewed and updated as appropriate: allergies, current medications, past family history, past medical history, past social history, past surgical history and problem list. Problem list updated.   Objective  Blood pressure 112/65, weight 155 lb (70.3 kg), last menstrual period 09/05/2020. Pregravid weight 126 lb (57.2 kg) Total Weight Gain 29 lb (13.2 kg) Urinalysis:      Fetal Status: Fetal Heart Rate (bpm): 140   Movement: Present     General:  Alert, oriented and cooperative. Patient is in no acute distress.  Skin: Skin is warm and dry. No rash noted.   Cardiovascular: Normal heart rate noted  Respiratory: Normal respiratory effort, no problems with respiration noted  Abdomen: Soft, gravid, appropriate for gestational age. Pain/Pressure: Absent     Pelvic:  Cervical exam deferred        Extremities: Normal range of motion.     ental Status: Normal mood and  affect. Normal behavior. Normal judgment and thought content.     Assessment   35 y.o. G2P0010 at [redacted]w[redacted]d by  06/12/2021, by Last Menstrual Period presenting for routine prenatal visit  Plan   Pregnancy 1 Problems (from 10/31/20 to present)     Problem Noted Resolved   Supervision of normal first pregnancy, antepartum 10/31/2020 by Vena Austria, MD No   Overview Addendum 01/21/2021  4:53 PM by Nadara Mustard, MD     Nursing Staff Provider  Office Location  [facility] Dating  LMP = 9 week [place]  Language  English Anatomy US  nml  Flu Vaccine   Genetic Screen  NIPS: Normal XY  TDaP vaccine    Hgb A1C or  GTT  Third trimester :   Covid UTD   LAB RESULTS   Rhogam  Not needed Blood Type   A pos  Feeding Plan  Antibody  negative  Contraception  Rubella  immune  Circumcision  RPR   NR  Pediatrician   HBsAg   negative  Support Person Husband Mellody Dance HIV  negative  Prenatal Classes  Varicella Immune    GBS  (For PCN allergy, check sensitivities)   BTL Consent     VBAC Consent N/a Pap  202 NIL HPV neg    Hgb Electro      CF negative     SMA Positive (FOB negative)            Antepartum multigravida of advanced maternal age 52/12/2020 by Vena Austria, MD No   Carrier of genetic disorder 10/31/2020 by Vena Austria, MD No   Overview Signed 10/31/2020 12:44 PM by Vena Austria, MD    SMA carrier significant other tested negative           Gestational age appropriate obstetric precautions including but not limited  to vaginal bleeding, contractions, leaking of fluid and fetal movement were reviewed in detail with the patient.    Return in about 4 weeks (around 03/07/2021) for 4 week ROB, and 6 week ROB and 28 week labs.  Vena Austria, MD, [facility], [facility] 02/07/2021, 6:10 PM

## 2021-02-07 NOTE — Progress Notes (Signed)
ROB - no concerns. RM 2 

## 2021-02-20 DIAGNOSIS — Z20828 Contact with and (suspected) exposure to other viral communicable diseases: Secondary | ICD-10-CM | POA: Diagnosis not present

## 2021-02-20 DIAGNOSIS — R059 Cough, unspecified: Secondary | ICD-10-CM | POA: Diagnosis not present

## 2021-02-22 DIAGNOSIS — R059 Cough, unspecified: Secondary | ICD-10-CM | POA: Diagnosis not present

## 2021-02-22 DIAGNOSIS — Z20828 Contact with and (suspected) exposure to other viral communicable diseases: Secondary | ICD-10-CM | POA: Diagnosis not present

## 2021-03-11 NOTE — Telephone Encounter (Signed)
Pt calling; 27wks spotting; no cramping, feet swollen.  [phone]  Pt states she is just calling to let us know how she is doing; states she freaked out Fri.  States the spotting resolved after an hour; no protein in urine, BP fine - 119/68, feels fine, swollen feet are a whole lot better.

## 2021-03-15 ENCOUNTER — Other Ambulatory Visit: Payer: Self-pay

## 2021-03-15 ENCOUNTER — Ambulatory Visit (INDEPENDENT_AMBULATORY_CARE_PROVIDER_SITE_OTHER): Payer: BC Managed Care – PPO | Admitting: Obstetrics and Gynecology

## 2021-03-15 VITALS — BP 108/64 | Wt 166.0 lb

## 2021-03-15 DIAGNOSIS — Z3400 Encounter for supervision of normal first pregnancy, unspecified trimester: Secondary | ICD-10-CM

## 2021-03-15 DIAGNOSIS — O09529 Supervision of elderly multigravida, unspecified trimester: Secondary | ICD-10-CM

## 2021-03-15 DIAGNOSIS — Z3A27 27 weeks gestation of pregnancy: Secondary | ICD-10-CM

## 2021-03-15 LAB — POCT URINALYSIS DIPSTICK OB
Glucose, UA: NEGATIVE
POC,PROTEIN,UA: NEGATIVE

## 2021-03-15 NOTE — Progress Notes (Signed)
ROB- no concerns 

## 2021-03-15 NOTE — Progress Notes (Signed)
Routine Prenatal Care Visit  Subjective  Mary Kramer is a 35 y.o. G2P0010 at [redacted]w[redacted]d being seen today for ongoing prenatal care.  She is currently monitored for the following issues for this low-risk pregnancy and has Lump or mass in breast; ADHD (attention deficit hyperactivity disorder); Hypoglycemia; Migraine without aura; Insomnia; Vitamin D deficiency; Controlled substance agreement signed; Asthma, stable, mild intermittent; Supervision of normal first pregnancy, antepartum; Antepartum multigravida of advanced maternal age; Carrier of genetic disorder; and History of loop electrosurgical excision procedure (LEEP) of cervix affecting pregnancy, antepartum on their problem list.  ----------------------------------------------------------------------------------- Patient reports no complaints.   Contractions: Not present. Vag. Bleeding: None.  Movement: Present. Denies leaking of fluid.  ----------------------------------------------------------------------------------- The following portions of the patient's history were reviewed and updated as appropriate: allergies, current medications, past family history, past medical history, past social history, past surgical history and problem list. Problem list updated.   Objective  Blood pressure 108/64, weight 166 lb (75.3 kg), last menstrual period 09/05/2020. Pregravid weight 126 lb (57.2 kg) Total Weight Gain 40 lb (18.1 kg) Urinalysis:      Fetal Status: Fetal Heart Rate (bpm): 150 Fundal Height: 27 cm Movement: Present     General:  Alert, oriented and cooperative. Patient is in no acute distress.  Skin: Skin is warm and dry. No rash noted.   Cardiovascular: Normal heart rate noted  Respiratory: Normal respiratory effort, no problems with respiration noted  Abdomen: Soft, gravid, appropriate for gestational age. Pain/Pressure: Absent     Pelvic:  Cervical exam deferred        Extremities: Normal range of motion.     ental  Status: Normal mood and affect. Normal behavior. Normal judgment and thought content.     Assessment   35 y.o. G2P0010 at [redacted]w[redacted]d by  06/12/2021, by Last Menstrual Period presenting for routine prenatal visit  Plan   Pregnancy 1 Problems (from 10/31/20 to present)     Problem Noted Resolved   Supervision of normal first pregnancy, antepartum 10/31/2020 by Vena Austria, MD No   Overview Addendum 01/21/2021  4:53 PM by Nadara Mustard, MD     Nursing Staff Provider  Office Location  [facility] Dating  LMP = 9 week [place]  Language  English Anatomy US  nml  Flu Vaccine   Genetic Screen  NIPS: Normal XY  TDaP vaccine    Hgb A1C or  GTT  Third trimester :   Covid UTD   LAB RESULTS   Rhogam  Not needed Blood Type   A pos  Feeding Plan  Antibody  negative  Contraception  Rubella  immune  Circumcision  RPR   NR  Pediatrician   HBsAg   negative  Support Person Husband Mellody Dance HIV  negative  Prenatal Classes  Varicella Immune    GBS  (For PCN allergy, check sensitivities)   BTL Consent     VBAC Consent N/a Pap  202 NIL HPV neg    Hgb Electro      CF negative     SMA Positive (FOB negative)            Antepartum multigravida of advanced maternal age 75/12/2020 by Vena Austria, MD No   Carrier of genetic disorder 10/31/2020 by Vena Austria, MD No   Overview Signed 10/31/2020 12:44 PM by Vena Austria, MD    SMA carrier significant other tested negative           Gestational age appropriate obstetric precautions including but  not limited to vaginal bleeding, contractions, leaking of fluid and fetal movement were reviewed in detail with the patient.    Return in about 2 weeks (around 03/29/2021) for ROB.  Vena Austria, MD, [facility], [facility] 03/15/2021, 3:48 PM

## 2021-03-22 ENCOUNTER — Encounter: Payer: Self-pay | Admitting: Obstetrics & Gynecology

## 2021-03-22 ENCOUNTER — Other Ambulatory Visit: Payer: Self-pay

## 2021-03-22 ENCOUNTER — Other Ambulatory Visit: Payer: BC Managed Care – PPO

## 2021-03-22 ENCOUNTER — Ambulatory Visit (INDEPENDENT_AMBULATORY_CARE_PROVIDER_SITE_OTHER): Payer: BC Managed Care – PPO | Admitting: Obstetrics & Gynecology

## 2021-03-22 VITALS — BP 120/80 | Wt 170.0 lb

## 2021-03-22 DIAGNOSIS — O09529 Supervision of elderly multigravida, unspecified trimester: Secondary | ICD-10-CM

## 2021-03-22 DIAGNOSIS — O99810 Abnormal glucose complicating pregnancy: Secondary | ICD-10-CM | POA: Diagnosis not present

## 2021-03-22 DIAGNOSIS — Z131 Encounter for screening for diabetes mellitus: Secondary | ICD-10-CM | POA: Diagnosis not present

## 2021-03-22 DIAGNOSIS — Z3403 Encounter for supervision of normal first pregnancy, third trimester: Secondary | ICD-10-CM

## 2021-03-22 DIAGNOSIS — Z3A28 28 weeks gestation of pregnancy: Secondary | ICD-10-CM

## 2021-03-22 NOTE — Progress Notes (Signed)
  Subjective  Fetal Movement? yes Contractions? no Leaking Fluid? no Vaginal Bleeding? no  Objective  BP 120/80   Wt 170 lb (77.1 kg)   LMP 09/05/2020 (Exact Date)   BMI 31.09 kg/m  General: NAD Pumonary: no increased work of breathing Abdomen: gravid, non-tender Extremities: no edema Psychiatric: mood appropriate, affect full  Assessment  35 y.o. G2P0010 at [redacted]w[redacted]d by  06/12/2021, by Last Menstrual Period presenting for routine prenatal visit  Plan   Problem List Items Addressed This Visit      Other   Supervision of normal first pregnancy, antepartum - Primary   Antepartum multigravida of advanced maternal age  Other Visit Diagnoses    Screening for diabetes mellitus       Relevant Orders   28 Week RH+Panel   [redacted] weeks gestation of pregnancy        Labs today PNV DIscussed AMA and labor  Pregnancy 1 Problems (from 10/31/20 to present)    Problem Noted Resolved   Supervision of normal first pregnancy, antepartum 10/31/2020 by Vena Austria, MD No   Overview Addendum 01/21/2021  4:53 PM by Nadara Mustard, MD     Nursing Staff Provider  Office Location  [facility] Dating  LMP = 9 week [place]  Language  English Anatomy US  nml  Flu Vaccine   Genetic Screen  NIPS: Normal XY  TDaP vaccine    Hgb A1C or  GTT  Third trimester :   Covid UTD   LAB RESULTS   Rhogam  Not needed Blood Type   A pos  Feeding Plan  Antibody  negative  Contraception  Rubella  immune  Circumcision  RPR   NR  Pediatrician   HBsAg   negative  Support Person Husband Mellody Dance HIV  negative  Prenatal Classes planning Varicella Immune    GBS  (For PCN allergy, check sensitivities)   BTL Consent no    VBAC Consent N/a Pap  202 NIL HPV neg    Hgb Electro      CF negative     SMA Positive (FOB negative)             Antepartum multigravida of advanced maternal age 48/12/2020 by Vena Austria, MD No   Carrier of genetic disorder 10/31/2020 by Vena Austria, MD No   Overview Signed 10/31/2020 12:44  PM by Vena Austria, MD    SMA carrier significant other tested negative          Annamarie Major, MD, [facility], [facility] 03/22/2021  9:53 AM

## 2021-03-22 NOTE — Patient Instructions (Signed)

## 2021-03-23 LAB — 28 WEEK RH+PANEL
Basophils Absolute: 0 10*3/uL (ref 0.0–0.2)
Basos: 0 %
EOS (ABSOLUTE): 0.1 10*3/uL (ref 0.0–0.4)
Eos: 1 %
Gestational Diabetes Screen: 158 mg/dL — ABNORMAL HIGH (ref 70–139)
HIV Screen 4th Generation wRfx: NONREACTIVE
Hematocrit: 38.3 % (ref 34.0–46.6)
Hemoglobin: 13 g/dL (ref 11.1–15.9)
Immature Grans (Abs): 0.2 10*3/uL — ABNORMAL HIGH (ref 0.0–0.1)
Immature Granulocytes: 2 %
Lymphocytes Absolute: 2 10*3/uL (ref 0.7–3.1)
Lymphs: 16 %
MCH: 30.4 pg (ref 26.6–33.0)
MCHC: 33.9 g/dL (ref 31.5–35.7)
MCV: 90 fL (ref 79–97)
Monocytes Absolute: 0.8 10*3/uL (ref 0.1–0.9)
Monocytes: 7 %
Neutrophils Absolute: 9.3 10*3/uL — ABNORMAL HIGH (ref 1.4–7.0)
Neutrophils: 74 %
Platelets: 277 10*3/uL (ref 150–450)
RBC: 4.28 x10E6/uL (ref 3.77–5.28)
RDW: 12.6 % (ref 11.7–15.4)
RPR Ser Ql: NONREACTIVE
WBC: 12.4 10*3/uL — ABNORMAL HIGH (ref 3.4–10.8)

## 2021-03-25 ENCOUNTER — Other Ambulatory Visit: Payer: Self-pay | Admitting: Obstetrics & Gynecology

## 2021-03-25 DIAGNOSIS — O99810 Abnormal glucose complicating pregnancy: Secondary | ICD-10-CM

## 2021-03-25 NOTE — Progress Notes (Signed)
Sch 3 hour GTT.  Pt made aware of results, thru MyChart

## 2021-03-29 ENCOUNTER — Other Ambulatory Visit: Payer: Self-pay

## 2021-03-29 ENCOUNTER — Other Ambulatory Visit: Payer: BC Managed Care – PPO

## 2021-03-29 ENCOUNTER — Ambulatory Visit (INDEPENDENT_AMBULATORY_CARE_PROVIDER_SITE_OTHER): Payer: BC Managed Care – PPO | Admitting: Obstetrics and Gynecology

## 2021-03-29 VITALS — BP 118/72 | Wt 170.0 lb

## 2021-03-29 DIAGNOSIS — O99810 Abnormal glucose complicating pregnancy: Secondary | ICD-10-CM | POA: Diagnosis not present

## 2021-03-29 DIAGNOSIS — Z3400 Encounter for supervision of normal first pregnancy, unspecified trimester: Secondary | ICD-10-CM

## 2021-03-29 DIAGNOSIS — Z3A29 29 weeks gestation of pregnancy: Secondary | ICD-10-CM

## 2021-03-29 DIAGNOSIS — O09529 Supervision of elderly multigravida, unspecified trimester: Secondary | ICD-10-CM

## 2021-03-29 NOTE — Progress Notes (Signed)
ROB - 3hr gtt, no concerns. RM 4

## 2021-03-29 NOTE — Progress Notes (Signed)
Routine Prenatal Care Visit  Subjective  Mary Kramer is a 35 y.o. G2P0010 at [redacted]w[redacted]d being seen today for ongoing prenatal care.  She is currently monitored for the following issues for this low-risk pregnancy and has Lump or mass in breast; ADHD (attention deficit hyperactivity disorder); Hypoglycemia; Migraine without aura; Insomnia; Vitamin D deficiency; Controlled substance agreement signed; Asthma, stable, mild intermittent; Supervision of normal first pregnancy, antepartum; Antepartum multigravida of advanced maternal age; Carrier of genetic disorder; and History of loop electrosurgical excision procedure (LEEP) of cervix affecting pregnancy, antepartum on their problem list.  ----------------------------------------------------------------------------------- Patient reports no complaints.   Contractions: Not present. Vag. Bleeding: None.  Movement: Present. Denies leaking of fluid.  ----------------------------------------------------------------------------------- The following portions of the patient's history were reviewed and updated as appropriate: allergies, current medications, past family history, past medical history, past social history, past surgical history and problem list. Problem list updated.   Objective  Last menstrual period 09/05/2020. Pregravid weight 126 lb (57.2 kg) Total Weight Gain 44 lb (20 kg) Urinalysis:      Fetal Status: Fetal Heart Rate (bpm): 150 Fundal Height: 28 cm Movement: Present     General:  Alert, oriented and cooperative. Patient is in no acute distress.  Skin: Skin is warm and dry. No rash noted.   Cardiovascular: Normal heart rate noted  Respiratory: Normal respiratory effort, no problems with respiration noted  Abdomen: Soft, gravid, appropriate for gestational age. Pain/Pressure: Absent     Pelvic:  Cervical exam deferred        Extremities: Normal range of motion.     ental Status: Normal mood and affect. Normal behavior. Normal  judgment and thought content.     Assessment   36 y.o. G2P0010 at [redacted]w[redacted]d by  06/12/2021, by Last Menstrual Period presenting for routine prenatal visit  Plan   Pregnancy 1 Problems (from 10/31/20 to present)     Problem Noted Resolved   Supervision of normal first pregnancy, antepartum 10/31/2020 by Vena Austria, MD No   Overview Addendum 01/21/2021  4:53 PM by Nadara Mustard, MD     Nursing Staff Provider  Office Location  [facility] Dating  LMP = 9 week [place]  Language  English Anatomy US  nml  Flu Vaccine   Genetic Screen  NIPS: Normal XY  TDaP vaccine    Hgb A1C or  GTT  Third trimester :   Covid UTD   LAB RESULTS   Rhogam  Not needed Blood Type   A pos  Feeding Plan  Antibody  negative  Contraception  Rubella  immune  Circumcision  RPR   NR  Pediatrician   HBsAg   negative  Support Person Husband Mellody Dance HIV  negative  Prenatal Classes  Varicella Immune    GBS  (For PCN allergy, check sensitivities)   BTL Consent     VBAC Consent N/a Pap  202 NIL HPV neg    Hgb Electro      CF negative     SMA Positive (FOB negative)            Antepartum multigravida of advanced maternal age 37/12/2020 by Vena Austria, MD No   Carrier of genetic disorder 10/31/2020 by Vena Austria, MD No   Overview Signed 10/31/2020 12:44 PM by Vena Austria, MD    SMA carrier significant other tested negative           Gestational age appropriate obstetric precautions including but not limited to vaginal bleeding, contractions, leaking of  fluid and fetal movement were reviewed in detail with the patient.    -3hr OGTT  Return in about 2 weeks (around 04/12/2021) for ROB.  Vena Austria, MD, [facility], [facility] 03/29/2021, 9:38 AM

## 2021-03-30 LAB — GESTATIONAL GLUCOSE TOLERANCE
Glucose, Fasting: 93 mg/dL (ref 70–94)
Glucose, GTT - 1 Hour: 141 mg/dL (ref 70–179)
Glucose, GTT - 2 Hour: 147 mg/dL (ref 70–154)
Glucose, GTT - 3 Hour: 100 mg/dL (ref 70–139)

## 2021-04-01 ENCOUNTER — Encounter: Payer: BC Managed Care – PPO | Admitting: Obstetrics and Gynecology

## 2021-04-04 ENCOUNTER — Telehealth: Payer: Self-pay

## 2021-04-04 NOTE — Telephone Encounter (Signed)
Pt  calling; wants our opinion of getting the flu vac; any risk of having a miscarriage - even the smallest chance?  Has very high anxiety level and is afraid she will do something wrong.  [phone]  Adv pt in my hears of working in [place] I have never heard of the flu vac causing a miscarriage;  It will help protect the baby just like the covid vac.  Adv AMS would want his wife to get the flu vac if she were preg. Adv pt to breathe and enjoy the pregnancy.  Pt reassured.

## 2021-04-15 ENCOUNTER — Encounter: Payer: Self-pay | Admitting: Obstetrics & Gynecology

## 2021-04-15 ENCOUNTER — Other Ambulatory Visit: Payer: Self-pay

## 2021-04-15 ENCOUNTER — Ambulatory Visit (INDEPENDENT_AMBULATORY_CARE_PROVIDER_SITE_OTHER): Payer: BC Managed Care – PPO | Admitting: Obstetrics & Gynecology

## 2021-04-15 VITALS — BP 120/80 | Wt 173.0 lb

## 2021-04-15 DIAGNOSIS — Z23 Encounter for immunization: Secondary | ICD-10-CM | POA: Diagnosis not present

## 2021-04-15 DIAGNOSIS — O09529 Supervision of elderly multigravida, unspecified trimester: Secondary | ICD-10-CM

## 2021-04-15 DIAGNOSIS — Z3403 Encounter for supervision of normal first pregnancy, third trimester: Secondary | ICD-10-CM

## 2021-04-15 DIAGNOSIS — Z3A31 31 weeks gestation of pregnancy: Secondary | ICD-10-CM

## 2021-04-15 LAB — POCT URINALYSIS DIPSTICK OB
Glucose, UA: NEGATIVE
POC,PROTEIN,UA: NEGATIVE

## 2021-04-15 NOTE — Addendum Note (Signed)
Addended by: Cornelius Moras D on: 04/15/2021 09:38 AM   Modules accepted: Orders

## 2021-04-15 NOTE — Progress Notes (Signed)
  Subjective  Fetal Movement? yes Contractions? no Leaking Fluid? no Vaginal Bleeding? no  Objective  BP 120/80   Wt 173 lb (78.5 kg)   LMP 09/05/2020 (Exact Date)   BMI 31.64 kg/m  General: NAD Pumonary: no increased work of breathing Abdomen: gravid, non-tender Extremities: no edema Psychiatric: mood appropriate, affect full  Assessment  35 y.o. G2P0010 at [redacted]w[redacted]d by  06/12/2021, by Last Menstrual Period presenting for routine prenatal visit  Plan   Problem List Items Addressed This Visit      Other   Supervision of normal first pregnancy, antepartum - Primary   Antepartum multigravida of advanced maternal age  Other Visit Diagnoses    [redacted] weeks gestation of pregnancy        PNV, [facility] Breast feeding plans Desires Nexplanon perhaps for pp contraception. Alternatives discussed. Glucola abn, 3 hour normal, discussed TDaP today  Pregnancy 1 Problems (from 10/31/20 to present)    Problem Noted Resolved   Supervision of normal first pregnancy, antepartum 10/31/2020 by Vena Austria, MD No   Overview Addendum 04/15/2021  9:33 AM by Nadara Mustard, MD     Nursing Staff Provider  Office Location  [facility] Dating  LMP = 9 week [place]  Language  English Anatomy US  nml  Flu Vaccine   Genetic Screen  NIPS: Normal XY  TDaP vaccine   04/15/21 Hgb A1C or  GTT  Third trimester :   Covid UTD   LAB RESULTS   Rhogam  Not needed Blood Type   A pos  Feeding Plan Breast Antibody  negative  Contraception Nexplanon Rubella  immune  Circumcision  RPR   NR  Pediatrician   HBsAg   negative  Support Person Husband Mellody Dance HIV  negative  Prenatal Classes  Varicella Immune    GBS  (For PCN allergy, check sensitivities)   BTL Consent No    VBAC Consent N/a Pap  202 NIL HPV neg    Hgb Electro      CF negative     SMA Positive (FOB negative)             Antepartum multigravida of advanced maternal age 66/12/2020 by Vena Austria, MD No   Carrier of genetic disorder 10/31/2020 by  Vena Austria, MD No   Overview Signed 10/31/2020 12:44 PM by Vena Austria, MD    SMA carrier significant other tested negative          Annamarie Major, MD, [facility], [facility] 04/15/2021  9:34 AM

## 2021-04-15 NOTE — Patient Instructions (Signed)
Thank you for choosing [facility]. As part of our ongoing efforts to improve patient experience, we would appreciate your feedback. Please fill out the short survey that you will receive by mail or MyChart. Your opinion is important to [place]! -Dr Tiburcio Pea   Tdap (Tetanus, Diphtheria, Pertussis) Vaccine: What You Need to Know 1. Why get vaccinated? Tdap vaccine can prevent tetanus, diphtheria, and pertussis. Diphtheria and pertussis spread from person to person. Tetanus enters the body through cuts or wounds. TETANUS (T) causes painful stiffening of the muscles. Tetanus can lead to serious health problems, including being unable to open the mouth, having trouble swallowing and breathing, or death. DIPHTHERIA (D) can lead to difficulty breathing, heart failure, paralysis, or death. PERTUSSIS (aP), also known as "whooping cough," can cause uncontrollable, violent coughing that makes it hard to breathe, eat, or drink. Pertussis can be extremely serious especially in babies and young children, causing pneumonia, convulsions, brain damage, or death. In teens and adults, it can cause weight loss, loss of bladder control, passing out, and rib fractures from severe coughing. 2. Tdap vaccine Tdap is only for children 7 years and older, adolescents, and adults.  Adolescents should receive a single dose of Tdap, preferably at age 75 or 12 years. Pregnant people should get a dose of Tdap during every pregnancy, preferably during the early part of the third trimester, to help protect the newborn from pertussis. Infants are most at risk for severe, life-threatening complications from pertussis. Adults who have never received Tdap should get a dose of Tdap. Also, adults should receive a booster dose of either Tdap or Td (a different vaccine that protects against tetanus and diphtheria but not pertussis) every 10 years, or after 5 years in the case of a severe or dirty wound or burn. Tdap may be given at the same  time as other vaccines. 3. Talk with your health care provider Tell your vaccine provider if the person getting the vaccine: Has had an allergic reaction after a previous dose of any vaccine that protects against tetanus, diphtheria, or pertussis, or has any severe, life-threatening allergies Has had a coma, decreased level of consciousness, or prolonged seizures within 7 days after a previous dose of any pertussis vaccine (DTP, DTaP, or Tdap) Has seizures or another nervous system problem Has ever had Guillain-Barr Syndrome (also called "GBS") Has had severe pain or swelling after a previous dose of any vaccine that protects against tetanus or diphtheria In some cases, your health care provider may decide to postpone Tdap vaccination until a future visit. People with minor illnesses, such as a cold, may be vaccinated. People who are moderately or severely ill should usually wait until they recover before getting Tdap vaccine.  Your health care provider can give you more information. 4. Risks of a vaccine reaction Pain, redness, or swelling where the shot was given, mild fever, headache, feeling tired, and nausea, vomiting, diarrhea, or stomachache sometimes happen after Tdap vaccination. People sometimes faint after medical procedures, including vaccination. Tell your provider if you feel dizzy or have vision changes or ringing in the ears.  As with any medicine, there is a very remote chance of a vaccine causing a severe allergic reaction, other serious injury, or death. 5. What if there is a serious problem? An allergic reaction could occur after the vaccinated person leaves the clinic. If you see signs of a severe allergic reaction (hives, swelling of the face and throat, difficulty breathing, a fast heartbeat, dizziness, or weakness), call  9-1-1 and get the person to the nearest hospital. For other signs that concern you, call your health care provider.  Adverse reactions should be reported  to the Vaccine Adverse Event Reporting System (VAERS). Your health care provider will usually file this report, or you can do it yourself. Visit the VAERS website at www.vaers.LAgents.no or call (442)481-7244. VAERS is only for reporting reactions, and VAERS staff members do not give medical advice. 6. The National Vaccine Injury Compensation Program The [employer] Vaccine Injury Compensation Program (VICP) is a federal program that was created to compensate people who may have been injured by certain vaccines. Claims regarding alleged injury or death due to vaccination have a time limit for filing, which may be as short as two years. Visit the VICP website at SpiritualWord.at or call (947) 099-6801 to learn about the program and about filing a claim. 7. How can I learn more? Ask your health care provider. Call your local or state health department. Visit the website of the Food and Drug Administration (FDA) for vaccine package inserts and additional information at FinderList.no. Contact the Centers for Disease Control and Prevention (CDC): Call 863-399-5910 (1-800-CDC-INFO) or Visit CDC's website at PicCapture.uy. Vaccine Information Statement Tdap (Tetanus, Diphtheria, Pertussis) Vaccine (12/30/2019) This information is not intended to replace advice given to you by your health care provider. Make sure you discuss any questions you have with your health care provider. Document Revised: 01/25/2020 Document Reviewed: 01/25/2020 Elsevier Patient Education  2022 [employer].

## 2021-05-03 ENCOUNTER — Encounter: Payer: BC Managed Care – PPO | Admitting: Obstetrics

## 2021-05-06 ENCOUNTER — Telehealth: Payer: Self-pay

## 2021-05-06 NOTE — Telephone Encounter (Signed)
Pt called after hour nurse 05/05/21 12:20pm; 35wks; baby is having hiccups for about 10 minutes twice a day; he is moving fine; is squirmy after the hiccups; no abd pain or spotting; some cramping.  After hour nurse adv fetal hiccups are normal and harmless.  [phone]  called to f/u c pt; states she and baby are fine; she read where it wasn't good for the baby to have hiccups after 32wks; adv I have never heard that; hiccups are normal and the baby will have them off and on until delivery.

## 2021-05-10 ENCOUNTER — Encounter: Payer: BC Managed Care – PPO | Admitting: Obstetrics

## 2021-05-10 ENCOUNTER — Telehealth: Payer: Self-pay

## 2021-05-10 NOTE — Telephone Encounter (Signed)
Pt came in not realizing her appt had been cancelled.  TN called this triager and asked if I could speak to her; requested TN give pt the phone and let me talk to her as I am really not feeling good today.  Pt had two questions.  1. Baby is having hiccups more frequently; is that okay?  Adv it is normal and okay.  2. Pt has gel nails; is it okay for them to be soaked in acetone for about ?  Is there any crossover; adv I really didn't know for sure; that's a great question. Adv good ventilation.

## 2021-05-13 ENCOUNTER — Encounter: Payer: Self-pay | Admitting: Advanced Practice Midwife

## 2021-05-13 ENCOUNTER — Other Ambulatory Visit: Payer: Self-pay

## 2021-05-13 ENCOUNTER — Ambulatory Visit (INDEPENDENT_AMBULATORY_CARE_PROVIDER_SITE_OTHER): Payer: BC Managed Care – PPO | Admitting: Advanced Practice Midwife

## 2021-05-13 VITALS — BP 120/80 | Wt 181.0 lb

## 2021-05-13 DIAGNOSIS — Z3A35 35 weeks gestation of pregnancy: Secondary | ICD-10-CM

## 2021-05-13 DIAGNOSIS — O09529 Supervision of elderly multigravida, unspecified trimester: Secondary | ICD-10-CM

## 2021-05-13 DIAGNOSIS — O0993 Supervision of high risk pregnancy, unspecified, third trimester: Secondary | ICD-10-CM

## 2021-05-13 LAB — POCT URINALYSIS DIPSTICK OB
Glucose, UA: NEGATIVE
POC,PROTEIN,UA: NEGATIVE

## 2021-05-13 NOTE — Telephone Encounter (Signed)
Pt aware.

## 2021-05-13 NOTE — Patient Instructions (Signed)
Pain Relief During Labor and Delivery Many things can cause pain during labor and delivery, including: Pressure due to the baby moving through the pelvis. Stretching of tissues due to the baby moving through the birth canal. Muscle tension due to anxiety or nervousness. The uterus tightening (contracting)and relaxing to help move the baby. How do I get pain relief during labor and delivery? Discuss your pain relief options with your health care provider during your prenatal visits. Explore the options offered by your hospital or birth center. There are many ways to deal with the pain of labor and delivery. You can try relaxation techniques or doing relaxing activities, taking a warm shower or bath (hydrotherapy), or other methods. There are also many medicines available to help control pain. Relaxation techniques and activities Practice relaxation techniques or do relaxing activities, such as: Focused breathing. Meditation. Visualization. Aroma therapy. Listening to your favorite music. Hypnosis. Hydrotherapy Take a warm shower or bath. This may: Provide comfort and relaxation. Lessen your feeling of pain. Reduce the amount of pain medicine needed. Shorten the length of labor. Other methods Try doing other things, such as: Getting a massage or having counterpressure on your back. Applying warm packs or ice packs. Changing positions often, moving around, or using a birthing ball. Medicines You may be given: Pain medicine through an IV or an injection into a muscle. Pain medicine inserted into your spinal column. Injections of sterile water just under the skin on your lower back. Nitrous oxide inhalation therapy, also called laughing gas. What kinds of medicine are available for pain relief? There are two kinds of medicines that can be used to relieve pain during labor and delivery: Analgesics. These medicines decrease pain without causing you to lose feeling or the ability to move  your muscles. Anesthetics. These medicines block feeling in the body and can decrease your ability to move freely. Both kinds of medicine can cause minor side effects, such as nausea, trouble concentrating, and sleepiness. They can also affect the baby's heart rate before birth and his or her breathing after birth. For this reason, health care providers are careful about when and how much medicine is given. Which medicines are used to provide pain relief? Common medicines The most common medicines used to help manage pain during labor and delivery include: Opioids. Opioids are medicines that decrease how much pain you feel (perception of pain). These medicines can be given through an IV or may be used with anesthetics to block pain. Epidural analgesia. Epidural analgesia is given through a very thin tube that is inserted into the lower back. Medicine is delivered continuously to the area near your spinal column nerves (epidural space). After having this treatment, you may be able to move your legs, but you will not be able to walk. Depending on the amount and type of medicine given, you may lose all feeling in the lower half of your body, or you may have some sensation, including the urge to push. This treatment can be used to give pain relief for a vaginal birth. Sometimes, a numbing medicine is injected into the spinal fluid when an epidural catheter is placed. This provides for immediate relief but only lasts for 1-2 hours. Once it wears off, the epidural will provide pain relief. This is called a combined spinal-epidural (CSE) block. Intrathecal analgesia (spinal analgesia). Intrathecal analgesia is similar to epidural analgesia, but the medicine is injected into the spinal fluid instead of the epidural space. It is usually only given once. It  starts to relieve pain quickly, but the pain relief lasts only 1-2 hours. Pudendal block. This block is done by injecting numbing medicine through the wall of  the vagina and into a nerve in the pelvis. Other medicines Other medicines used to help manage pain during labor and delivery include: Local anesthetics. These are used to numb a small area of the body. They may be used along with another kind of medicine or used to numb the nerves of the vagina, cervix, and perineum during the second stage of labor. Spinal block (spinal anesthesia). Spinal anesthesia is similar to spinal analgesia, but the medicine that is used contains longer-acting numbing medicines and pain medicines. This type of anesthesia can be used for a cesarean delivery and allows you to stay awake for the birth of your baby. General anesthetics cause you to lose consciousness so you do not feel pain. They are usually only used for an emergency cesarean delivery. These medicines are given through an IV or a mask or both. These medicines are used as part of a procedure or for an emergency delivery. Summary Women have many options to help them manage the pain associated with labor and delivery. You can try doing relaxing activities, taking a warm shower or bath, or other methods. There are also many medicines available to help control pain during labor and delivery. Talk with your health care provider about what options are available to you. This information is not intended to replace advice given to you by your health care provider. Make sure you discuss any questions you have with your health care provider. Document Revised: 03/30/2019 Document Reviewed: 03/30/2019 Elsevier Patient Education  2022 Elsevier Inc. Vaginal Delivery Vaginal delivery means that you give birth by pushing your baby out of your birth canal (vagina). Your health care team will help you before, during, and after vaginal delivery. Birth experiences are unique for every woman and every pregnancy, and birth experiences vary depending on where you choose to give birth. What are the risks and benefits? Generally, this  is safe. However, problems may occur, including: Bleeding. Infection. Damage to other structures such as vaginal tearing. Allergic reactions to medicines. Despite the risks, benefits of vaginal delivery include less risk of bleeding and infection and a shorter recovery time compared to a Cesarean delivery. Cesarean delivery, or C-section, is the surgical delivery of a baby. What happens when I arrive at the birth center or hospital? Once you are in labor and have been admitted into the hospital or birth center, your health care team may: Review your pregnancy history and any concerns that you have. Talk with you about your birth plan and discuss pain control options. Check your blood pressure, breathing, and heartbeat. Assess your baby's heartbeat. Monitor your uterus for contractions. Check whether your bag of water (amniotic sac) has broken (ruptured). Insert an IV into one of your veins. This may be used to give you fluids and medicines. Monitoring Your health care team may assess your contractions (uterine monitoring) and your baby's heart rate (fetal monitoring). You may need to be monitored: Often, but not continuously (intermittently). All the time or for long periods at a time (continuously). Continuous monitoring may be needed if: You are taking certain medicines, such as medicine to relieve pain or make your contractions stronger. You have pregnancy or labor complications. Monitoring may be done by: Placing a special stethoscope or a handheld monitoring device on your abdomen to check your baby's heartbeat and to check for contractions.  Placing monitors on your abdomen (external monitors) to record your baby's heartbeat and the frequency and length of contractions. °Placing monitors inside your uterus through your vagina (internal monitors) to record your baby's heartbeat and the frequency, length, and strength of your contractions. Depending on the type of monitor, it may remain in  your uterus or on your baby's head until birth. °Telemetry. This is a type of continuous monitoring that can be done with external or internal monitors. Instead of having to stay in bed, you are able to move around. °Physical exam °Your health care team may perform frequent physical exams. This may include: °Checking how and where your baby is positioned in your uterus. °Checking your cervix to determine: °Whether it is thinning out (effacing). °Whether it is opening up (dilating). °What happens during labor and delivery? °Normal labor and delivery is divided into the following three stages: °Stage 1 °This is the longest stage of labor. °Throughout this stage, you will feel contractions. Contractions generally feel mild, infrequent, and irregular at first. They get stronger, more frequent, and more regular as you move through this stage. You may have contractions about every 2-3 minutes. °This stage ends when your cervix is completely dilated to 4 inches (10 cm) and completely effaced. °Stage 2 °This stage starts once your cervix is completely effaced and dilated and lasts until the delivery of your baby. °This is the stage where you will feel an urge to push your baby out of your vagina. °You may feel stretching and burning pain, especially when the widest part of your baby's head passes through the vaginal opening (crowning). °Once your baby is delivered, the umbilical cord will be clamped and cut. Timing of cutting the cord will depend on your wishes, your baby's health, and your health care provider's practices. °Your baby will be placed on your bare chest (skin-to-skin contact) in an upright position and covered with a warm blanket. If you are choosing to breastfeed, watch your baby for feeding cues, like rooting or sucking, and help the baby to your breast for his or her first feeding. °Stage 3 °This stage starts immediately after the birth of your baby and ends after you deliver the placenta. °This stage may  take anywhere from 5 to 30 minutes. °After your baby has been delivered, you will feel contractions as your body expels the placenta. These contractions also help your uterus get smaller and reduce bleeding. °What can I expect after labor and delivery? °After labor is over, you and your baby will be assessed closely until you are ready to go home. Your health care team will teach you how to care for yourself and your baby. °You and your baby may be encouraged to stay in the same room (rooming in) during your hospital stay. This will help promote early bonding and successful breastfeeding. °Your uterus will be checked and massaged regularly (fundal massage). °You may continue to receive fluids and medicines through an IV. °You will have some soreness and pain in your abdomen, vagina, and the area of skin between your vaginal opening and your anus (perineum). °If an incision was made near your vagina (episiotomy) or if you had some vaginal tearing during delivery, cold compresses may be placed on your episiotomy or your tear. This helps to reduce pain and swelling. °It is normal to have vaginal bleeding after delivery. Wear a sanitary pad for vaginal bleeding and discharge. °Summary °Vaginal delivery means that you will give birth by pushing your baby out of   your birth canal (vagina). °Your health care team will monitor you and your baby throughout the stages of labor. °After you deliver your baby, your health care team will continue to assess you and your baby to ensure you are both recovering as expected after delivery. °This information is not intended to replace advice given to you by your health care provider. Make sure you discuss any questions you have with your health care provider. °Document Revised: 04/09/2020 Document Reviewed: 04/09/2020 °Elsevier Patient Education © 2022 Elsevier Inc. ° °

## 2021-05-13 NOTE — Progress Notes (Signed)
Routine Prenatal Care Visit  Subjective  Mary Kramer is a 35 y.o. G2P0010 at [redacted]w[redacted]d being seen today for ongoing prenatal care.  She is currently monitored for the following issues for this high-risk pregnancy and has Lump or mass in breast; ADHD (attention deficit hyperactivity disorder); Hypoglycemia; Migraine without aura; Insomnia; Vitamin D deficiency; Controlled substance agreement signed; Asthma, stable, mild intermittent; Supervision of high-risk pregnancy; Antepartum multigravida of advanced maternal age; Carrier of genetic disorder; and History of loop electrosurgical excision procedure (LEEP) of cervix affecting pregnancy, antepartum on their problem list.  ----------------------------------------------------------------------------------- Patient reports no complaints.   Contractions: Irregular. Vag. Bleeding: None.  Movement: Present. Leaking Fluid denies.  ----------------------------------------------------------------------------------- The following portions of the patient's history were reviewed and updated as appropriate: allergies, current medications, past family history, past medical history, past social history, past surgical history and problem list. Problem list updated.  Objective  Blood pressure 120/80, weight 181 lb (82.1 kg), last menstrual period 09/05/2020. Pregravid weight 126 lb (57.2 kg) Total Weight Gain 55 lb (24.9 kg) Urinalysis: Urine Protein    Urine Glucose    Fetal Status: Fetal Heart Rate (bpm): 141 Fundal Height: 37 cm Movement: Present     General:  Alert, oriented and cooperative. Patient is in no acute distress.  Skin: Skin is warm and dry. No rash noted.   Cardiovascular: Normal heart rate noted  Respiratory: Normal respiratory effort, no problems with respiration noted  Abdomen: Soft, gravid, appropriate for gestational age. Pain/Pressure: Absent     Pelvic:  Cervical exam deferred        Extremities: Normal range of motion.  Edema:  Trace  Mental Status: Normal mood and affect. Normal behavior. Normal judgment and thought content.   Assessment   35 y.o. G2P0010 at [redacted]w[redacted]d by  06/12/2021, by Last Menstrual Period presenting for routine prenatal visit  Plan   Pregnancy 1 Problems (from 10/31/20 to present)    Problem Noted Resolved   Supervision of high-risk pregnancy 10/31/2020 by Malachy Mood, MD No   Overview Addendum 04/15/2021  9:33 AM by Gae Dry, MD     Nursing Staff Provider  Office Location  [facility] Dating  LMP = 9 week [place]  Language  English Anatomy US  nml  Flu Vaccine   Genetic Screen  NIPS: Normal XY  TDaP vaccine   04/15/21 Hgb A1C or  GTT  Third trimester :   Covid UTD   LAB RESULTS   Rhogam  Not needed Blood Type   A pos  Feeding Plan Breast Antibody  negative  Contraception Nexplanon Rubella  immune  Circumcision  RPR   NR  Pediatrician   HBsAg   negative  Support Person Husband Lanny Hurst HIV  negative  Prenatal Classes  Varicella Immune    GBS  (For PCN allergy, check sensitivities)   BTL Consent No    VBAC Consent N/a Pap  202 NIL HPV neg    Hgb Electro      CF negative     SMA Positive (FOB negative)             Antepartum multigravida of advanced maternal age 68/12/2020 by Malachy Mood, MD No   Carrier of genetic disorder 10/31/2020 by Malachy Mood, MD No   Overview Signed 10/31/2020 12:44 PM by Malachy Mood, MD    SMA carrier significant other tested negative          Preterm labor symptoms and general obstetric precautions including but not limited to vaginal bleeding,  contractions, leaking of fluid and fetal movement were reviewed in detail with the patient. Please refer to After Visit Summary for other counseling recommendations.   Return in about 1 week (around 05/20/2021) for rob.  Tresea Mall, CNM 05/13/2021 4:49 PM

## 2021-05-13 NOTE — Addendum Note (Signed)
Addended by: Cornelius Moras D on: 05/13/2021 04:53 PM   Modules accepted: Orders

## 2021-05-14 ENCOUNTER — Other Ambulatory Visit: Payer: Self-pay | Admitting: Obstetrics & Gynecology

## 2021-05-14 DIAGNOSIS — O99810 Abnormal glucose complicating pregnancy: Secondary | ICD-10-CM

## 2021-05-17 ENCOUNTER — Other Ambulatory Visit: Payer: Self-pay

## 2021-05-17 ENCOUNTER — Encounter: Payer: Self-pay | Admitting: Obstetrics & Gynecology

## 2021-05-17 ENCOUNTER — Ambulatory Visit (INDEPENDENT_AMBULATORY_CARE_PROVIDER_SITE_OTHER): Payer: BC Managed Care – PPO | Admitting: Obstetrics & Gynecology

## 2021-05-17 VITALS — BP 126/84 | Wt 181.0 lb

## 2021-05-17 DIAGNOSIS — O0993 Supervision of high risk pregnancy, unspecified, third trimester: Secondary | ICD-10-CM

## 2021-05-17 DIAGNOSIS — Z3A36 36 weeks gestation of pregnancy: Secondary | ICD-10-CM

## 2021-05-17 DIAGNOSIS — Z3685 Encounter for antenatal screening for Streptococcus B: Secondary | ICD-10-CM | POA: Diagnosis not present

## 2021-05-17 DIAGNOSIS — O09529 Supervision of elderly multigravida, unspecified trimester: Secondary | ICD-10-CM

## 2021-05-17 NOTE — Patient Instructions (Signed)

## 2021-05-17 NOTE — Progress Notes (Signed)
°  Subjective  Fetal Movement? yes Contractions? no Leaking Fluid? no Vaginal Bleeding? no  Objective  BP 126/84    Wt 181 lb (82.1 kg)    LMP 09/05/2020 (Exact Date)    BMI 33.11 kg/m  General: NAD Pumonary: no increased work of breathing Abdomen: gravid, non-tender Extremities: no edema Psychiatric: mood appropriate, affect full  Assessment  35 y.o. G2P0010 at [redacted]w[redacted]d by  06/12/2021, by Last Menstrual Period presenting for routine prenatal visit  Plan   Problem List Items Addressed This Visit       Other   Supervision of high-risk pregnancy - Primary   Antepartum multigravida of advanced maternal age   Other Visit Diagnoses     [redacted] weeks gestation of pregnancy       Encounter for antenatal screening for Streptococcus B       Relevant Orders   Culture, beta strep (group b only)     GBS today [facility], [facility] Labor precautions  Pregnancy 1 Problems (from 10/31/20 to present)     Problem Noted Resolved   Supervision of high-risk pregnancy 10/31/2020 by Vena Austria, MD No   Overview Addendum 05/17/2021  4:30 PM by Nadara Mustard, MD     Nursing Staff Provider  Office Location  [facility] Dating  LMP = 9 week [place]  Language  English Anatomy US  nml  Flu Vaccine   Genetic Screen  NIPS: Normal XY  TDaP vaccine   04/15/21 Hgb A1C or  GTT Third trimester : 158, 3 hour normal  Covid UTD   LAB RESULTS   Rhogam  Not needed Blood Type   A pos  Feeding Plan Breast Antibody  negative  Contraception Nexplanon Rubella  immune  Circumcision  RPR   NR  Pediatrician   HBsAg   negative  Support Person Husband Mellody Dance HIV  negative  Prenatal Classes  Varicella Immune    GBS  (For PCN allergy, check sensitivities)   BTL Consent No    VBAC Consent N/a Pap  202 NIL HPV neg    Hgb Electro      CF negative     SMA Positive (FOB negative)            Antepartum multigravida of advanced maternal age 61/12/2020 by Vena Austria, MD No   Carrier of genetic disorder 10/31/2020 by Vena Austria, MD No   Overview Signed 10/31/2020 12:44 PM by Vena Austria, MD    SMA carrier significant other tested negative           Annamarie Major, MD, [facility], [facility] 05/17/2021  4:40 PM

## 2021-05-21 LAB — CULTURE, BETA STREP (GROUP B ONLY): Strep Gp B Culture: NEGATIVE

## 2021-05-28 ENCOUNTER — Ambulatory Visit (INDEPENDENT_AMBULATORY_CARE_PROVIDER_SITE_OTHER): Payer: BC Managed Care – PPO | Admitting: Advanced Practice Midwife

## 2021-05-28 ENCOUNTER — Other Ambulatory Visit: Payer: Self-pay

## 2021-05-28 ENCOUNTER — Observation Stay
Admission: EM | Admit: 2021-05-28 | Discharge: 2021-05-28 | Disposition: A | Discharge status: Home / Self Care | Payer: BC Managed Care – PPO | Attending: Obstetrics & Gynecology | Admitting: Obstetrics & Gynecology

## 2021-05-28 ENCOUNTER — Encounter: Payer: Self-pay | Admitting: Advanced Practice Midwife

## 2021-05-28 ENCOUNTER — Observation Stay: Payer: BC Managed Care – PPO

## 2021-05-28 ENCOUNTER — Encounter: Payer: Self-pay | Admitting: Obstetrics & Gynecology

## 2021-05-28 VITALS — BP 120/80 | Wt 182.0 lb

## 2021-05-28 DIAGNOSIS — O26893 Other specified pregnancy related conditions, third trimester: Secondary | ICD-10-CM | POA: Insufficient documentation

## 2021-05-28 DIAGNOSIS — Z3A37 37 weeks gestation of pregnancy: Secondary | ICD-10-CM | POA: Diagnosis not present

## 2021-05-28 DIAGNOSIS — Z20822 Contact with and (suspected) exposure to covid-19: Secondary | ICD-10-CM | POA: Diagnosis not present

## 2021-05-28 DIAGNOSIS — O26843 Uterine size-date discrepancy, third trimester: Secondary | ICD-10-CM

## 2021-05-28 DIAGNOSIS — O0993 Supervision of high risk pregnancy, unspecified, third trimester: Secondary | ICD-10-CM

## 2021-05-28 DIAGNOSIS — O368330 Maternal care for abnormalities of the fetal heart rate or rhythm, third trimester, not applicable or unspecified: Secondary | ICD-10-CM

## 2021-05-28 DIAGNOSIS — O09529 Supervision of elderly multigravida, unspecified trimester: Secondary | ICD-10-CM

## 2021-05-28 DIAGNOSIS — O368390 Maternal care for abnormalities of the fetal heart rate or rhythm, unspecified trimester, not applicable or unspecified: Secondary | ICD-10-CM | POA: Diagnosis present

## 2021-05-28 DIAGNOSIS — Z148 Genetic carrier of other disease: Secondary | ICD-10-CM

## 2021-05-28 HISTORY — DX: Unspecified asthma, uncomplicated: J45.909

## 2021-05-28 LAB — CBC
HCT: 42.7 % (ref 36.0–46.0)
Hemoglobin: 14.3 g/dL (ref 12.0–15.0)
MCH: 29.7 pg (ref 26.0–34.0)
MCHC: 33.5 g/dL (ref 30.0–36.0)
MCV: 88.6 fL (ref 80.0–100.0)
Platelets: 290 10*3/uL (ref 150–400)
RBC: 4.82 MIL/uL (ref 3.87–5.11)
RDW: 13.5 % (ref 11.5–15.5)
WBC: 12.2 10*3/uL — ABNORMAL HIGH (ref 4.0–10.5)
nRBC: 0 % (ref 0.0–0.2)

## 2021-05-28 LAB — TYPE AND SCREEN
ABO/RH(D): A POS
Antibody Screen: NEGATIVE

## 2021-05-28 LAB — RESP PANEL BY RT-PCR (FLU A&B, COVID) ARPGX2
Influenza A by PCR: NEGATIVE
Influenza B by PCR: NEGATIVE
SARS Coronavirus 2 by RT PCR: NEGATIVE

## 2021-05-28 MED ORDER — ONDANSETRON HCL 4 MG/2ML IJ SOLN: 4.0000 mg | Freq: Four times a day (QID) | INTRAMUSCULAR | Status: DC | PRN

## 2021-05-28 NOTE — Progress Notes (Signed)
Discussed discharge instructions with patient, patient verbalizes understanding. Patient ambulatory in good condition home with significant other.

## 2021-05-28 NOTE — Discharge Summary (Signed)
  See FPN 

## 2021-05-28 NOTE — Progress Notes (Signed)
Patient transported to US 

## 2021-05-28 NOTE — Final Progress Note (Signed)
Physician Final Progress Note  Patient ID: Mary Kramer MRN: [id number] DOB/AGE: 1985-12-24 35 y.o.  Admit date: 05/28/2021 Admitting provider: Nadara Mustard, MD Discharge date: 05/28/2021   Admission Diagnoses:  Fetal tachycardia affecting management of mother  37 weeks pregnancy  Discharge Diagnoses:  Principal Problem:   Fetal tachycardia affecting management of mother  37 weeks pregnancy  Consults: None  Significant Findings/ Diagnostic Studies: Patient presented for evaluation of labor.  Patient had cervical exam by RN and this was reported to me. I reviewed her vital signs and fetal tracing, both of which were reassuring.  Patient was discharge as she was not laboring.  Procedures: A NST procedure was performed with FHR monitoring and a normal baseline established, appropriate time of 20-40 minutes of evaluation, and accels >2 seen w 15x15 characteristics.  Results show a REACTIVE NST.   Discharge Condition: good  Disposition: Discharge disposition: 01-Home or Self Care       Diet: Regular diet  Discharge Activity: Activity as tolerated  Discharge Instructions     Call MD for:   Complete by: As directed    Worsening contractions or pain; leakage of fluid; bleeding.   Diet - low sodium heart healthy   Complete by: As directed    Increase activity slowly   Complete by: As directed       Allergies as of 05/28/2021       Reactions   Lexapro [escitalopram Oxalate] Nausea Only   Hydrocodone Rash   Vicodin [hydrocodone-acetaminophen] Rash        Medication List     TAKE these medications    prenatal multivitamin Tabs tablet Take 1 tablet by mouth daily at 12 noon.         Total time spent taking care of this patient: TRIAGE  Signed: Letitia Libra 05/28/2021, 5:28 PM

## 2021-05-28 NOTE — OB Triage Note (Signed)
Patient is a G2P0, [redacted]w[redacted]d that was sent over from [facility] for further monitoring, states that the baby was tachycardiac. Patient denies any LOF or bleeding. Patient states that she does feel some tightening "every so often" in her belly and some vaginal pressure. Patient reports positive fetal movement. No c/o any other symptoms. VSS. Monitors applied and assessing.

## 2021-05-28 NOTE — Progress Notes (Signed)
Routine Prenatal Care Visit  Subjective  Mary Kramer is a 36 y.o. G2P0010 at [redacted]w[redacted]d being seen today for ongoing prenatal care.  She is currently monitored for the following issues for this high-risk pregnancy and has Lump or mass in breast; ADHD (attention deficit hyperactivity disorder); Hypoglycemia; Migraine without aura; Insomnia; Vitamin D deficiency; Controlled substance agreement signed; Asthma, stable, mild intermittent; Supervision of high-risk pregnancy; Antepartum multigravida of advanced maternal age; Carrier of genetic disorder; and History of loop electrosurgical excision procedure (LEEP) of cervix affecting pregnancy, antepartum on their problem list.  ----------------------------------------------------------------------------------- Patient reports  her belly feels tight all the time and braxton hicks are becoming increasingly uncomfortable. She has pins and needls feeling in epigastric area. She feels lightheaded, tired and weak today. She denies fever, body aches, congestion .   Contractions: Irregular. Vag. Bleeding: None.  Movement: Present. Leaking Fluid denies. She is uncertain regarding elective IOL at 39 weeks vs spontaneous labor. ----------------------------------------------------------------------------------- The following portions of the patient's history were reviewed and updated as appropriate: allergies, current medications, past family history, past medical history, past social history, past surgical history and problem list. Problem list updated.  Objective  Blood pressure 120/80, weight 182 lb (82.6 kg), last menstrual period 09/05/2020. Pregravid weight 126 lb (57.2 kg) Total Weight Gain 56 lb (25.4 kg) Urinalysis: Urine Protein    Urine Glucose    Fetal Status: Fetal Heart Rate (bpm): 177 Fundal Height: 40 cm Movement: Present     General:  Alert, oriented and cooperative. Patient is in no acute distress.  Skin: Skin is warm and dry. No rash noted.    Cardiovascular: Normal heart rate noted  Respiratory: Normal respiratory effort, no problems with respiration noted  Abdomen: Soft, gravid, appropriate for gestational age. Pain/Pressure: Present     Pelvic:  Cervical exam deferred        Extremities: Normal range of motion.  Edema: None  Mental Status: Normal mood and affect. Normal behavior. Normal judgment and thought content.   Assessment   36 y.o. G2P0010 at [redacted]w[redacted]d by  06/12/2021, by Last Menstrual Period presenting for routine prenatal visit  Plan   Pregnancy 1 Problems (from 10/31/20 to present)     Problem Noted Resolved   Supervision of high-risk pregnancy 10/31/2020 by Malachy Mood, MD No   Overview Addendum 05/17/2021  4:30 PM by Gae Dry, MD     Nursing Staff Provider  Office Location  [facility] Dating  LMP = 9 week [place]  Language  English Anatomy US  nml  Flu Vaccine   Genetic Screen  NIPS: Normal XY  TDaP vaccine   04/15/21 Hgb A1C or  GTT Third trimester : 158, 3 hour normal  Covid UTD   LAB RESULTS   Rhogam  Not needed Blood Type   A pos  Feeding Plan Breast Antibody  negative  Contraception Nexplanon Rubella  immune  Circumcision  RPR   NR  Pediatrician   HBsAg   negative  Support Person Husband Lanny Hurst HIV  negative  Prenatal Classes  Varicella Immune    GBS  (For PCN allergy, check sensitivities)   BTL Consent No    VBAC Consent N/a Pap  202 NIL HPV neg    Hgb Electro      CF negative     SMA Positive (FOB negative)             Antepartum multigravida of advanced maternal age 73/12/2020 by Malachy Mood, MD No   Carrier of genetic  disorder 10/31/2020 by Malachy Mood, MD No   Overview Signed 10/31/2020 12:44 PM by Malachy Mood, MD    SMA carrier significant other tested negative           Term labor symptoms and general obstetric precautions including but not limited to vaginal bleeding, contractions, leaking of fluid and fetal movement were reviewed in detail with the  patient. Please refer to After Visit Summary for other counseling recommendations.   Return in about 1 week (around 06/04/2021) for rob.  Rod Can, CNM 05/28/2021 1:53 PM

## 2021-05-28 NOTE — Progress Notes (Signed)
Patient back to unit from [place]. Will apply monitors and continue to assess.

## 2021-05-29 ENCOUNTER — Telehealth: Payer: Self-pay

## 2021-05-29 NOTE — Telephone Encounter (Signed)
Pt calling for a note to be written to start her maternity leave on 06/03/21; she can p/u.  [phone]

## 2021-05-30 ENCOUNTER — Encounter: Payer: Self-pay | Admitting: Advanced Practice Midwife

## 2021-05-30 ENCOUNTER — Encounter: Payer: BC Managed Care – PPO | Admitting: Advanced Practice Midwife

## 2021-05-30 ENCOUNTER — Other Ambulatory Visit: Payer: Self-pay | Admitting: Advanced Practice Midwife

## 2021-05-30 DIAGNOSIS — O219 Vomiting of pregnancy, unspecified: Secondary | ICD-10-CM

## 2021-05-30 MED ORDER — ONDANSETRON 4 MG PO TBDP
4.0000 mg | ORAL_TABLET | Freq: Four times a day (QID) | ORAL | 0 refills | Status: DC | PRN
Start: 2021-05-30 — End: 2021-06-17

## 2021-05-30 NOTE — Telephone Encounter (Signed)
Left detailed msg for pt

## 2021-05-30 NOTE — Progress Notes (Unsigned)
Rx zofran per patient request

## 2021-06-03 ENCOUNTER — Telehealth: Payer: Self-pay

## 2021-06-03 ENCOUNTER — Encounter: Payer: Self-pay | Admitting: Advanced Practice Midwife

## 2021-06-03 NOTE — Telephone Encounter (Signed)
Pt called the after hour nurse 06/02/21 12:37am; 39wks; had vomiting and diarrhea Thurs; was not seen but zofran was called in; had diarrhea 7times 06/02/21; is drinking pedialyte; last vomiting was on Thurs evening. No fever or any other sxs.  [phone]  Pt states stools are improving - no longer watery; no fever, vomiting; GFM; has appt later this week; adv sounds like she is getting better; to keep doing what she is doing; stay hydrated and eat a bland diet.

## 2021-06-06 ENCOUNTER — Encounter: Payer: Self-pay | Admitting: Obstetrics and Gynecology

## 2021-06-06 ENCOUNTER — Other Ambulatory Visit: Payer: Self-pay

## 2021-06-06 ENCOUNTER — Ambulatory Visit (INDEPENDENT_AMBULATORY_CARE_PROVIDER_SITE_OTHER): Payer: BC Managed Care – PPO | Admitting: Obstetrics and Gynecology

## 2021-06-06 VITALS — BP 132/87 | Wt 179.0 lb

## 2021-06-06 DIAGNOSIS — O0993 Supervision of high risk pregnancy, unspecified, third trimester: Secondary | ICD-10-CM

## 2021-06-06 DIAGNOSIS — O09529 Supervision of elderly multigravida, unspecified trimester: Secondary | ICD-10-CM

## 2021-06-06 DIAGNOSIS — Z3A39 39 weeks gestation of pregnancy: Secondary | ICD-10-CM

## 2021-06-06 NOTE — Progress Notes (Signed)
Routine Prenatal Care Visit  Subjective  Mary Kramer is a 36 y.o. G2P0010 at [redacted]w[redacted]d being seen today for ongoing prenatal care.  She is currently monitored for the following issues for this low-risk pregnancy and has Lump or mass in breast; ADHD (attention deficit hyperactivity disorder); Hypoglycemia; Migraine without aura; Insomnia; Vitamin D deficiency; Controlled substance agreement signed; Asthma, stable, mild intermittent; Supervision of high-risk pregnancy; Antepartum multigravida of advanced maternal age; Carrier of genetic disorder; History of loop electrosurgical excision procedure (LEEP) of cervix affecting pregnancy, antepartum; and Fetal tachycardia affecting management of mother on their problem list.  ----------------------------------------------------------------------------------- Patient reports no complaints.   Contractions: Irregular. Vag. Bleeding: None.  Movement: Present. Denies leaking of fluid.  ----------------------------------------------------------------------------------- The following portions of the patient's history were reviewed and updated as appropriate: allergies, current medications, past family history, past medical history, past social history, past surgical history and problem list. Problem list updated.   Objective  Blood pressure 132/87, weight 179 lb (81.2 kg), last menstrual period 09/05/2020. Pregravid weight 126 lb (57.2 kg) Total Weight Gain 53 lb (24 kg) Urinalysis:      Fetal Status: Fetal Heart Rate (bpm): 150 Fundal Height: 38 cm Movement: Present  Presentation: Vertex  General:  Alert, oriented and cooperative. Patient is in no acute distress.  Skin: Skin is warm and dry. No rash noted.   Cardiovascular: Normal heart rate noted  Respiratory: Normal respiratory effort, no problems with respiration noted  Abdomen: Soft, gravid, appropriate for gestational age. Pain/Pressure: Present     Pelvic:  Cervical exam performed Dilation:  1 Effacement (%): 50 Station: -3  Extremities: Normal range of motion.  Edema: None  ental Status: Normal mood and affect. Normal behavior. Normal judgment and thought content.     Assessment   36 y.o. G2P0010 at [redacted]w[redacted]d by  06/12/2021, by Last Menstrual Period presenting for routine prenatal visit  Plan   Pregnancy 1 Problems (from 10/31/20 to present)     Problem Noted Resolved   Supervision of high-risk pregnancy 10/31/2020 by Malachy Mood, MD No   Overview Addendum 05/17/2021  4:30 PM by Gae Dry, MD     Nursing Staff Provider  Office Location  [facility] Dating  LMP = 9 week [place]  Language  English Anatomy US  nml  Flu Vaccine   Genetic Screen  NIPS: Normal XY  TDaP vaccine   04/15/21 Hgb A1C or  GTT Third trimester : 158, 3 hour normal  Covid UTD   LAB RESULTS   Rhogam  Not needed Blood Type   A pos  Feeding Plan Breast Antibody  negative  Contraception Nexplanon Rubella  immune  Circumcision  RPR   NR  Pediatrician   HBsAg   negative  Support Person Husband Lanny Hurst HIV  negative  Prenatal Classes  Varicella Immune    GBS  (For PCN allergy, check sensitivities)   BTL Consent No    VBAC Consent N/a Pap  202 NIL HPV neg    Hgb Electro      CF negative     SMA Positive (FOB negative)            Antepartum multigravida of advanced maternal age 65/12/2020 by Malachy Mood, MD No   Carrier of genetic disorder 10/31/2020 by Malachy Mood, MD No   Overview Signed 10/31/2020 12:44 PM by Malachy Mood, MD    SMA carrier significant other tested negative           Gestational age appropriate  obstetric precautions including but not limited to vaginal bleeding, contractions, leaking of fluid and fetal movement were reviewed in detail with the patient.    No follow-ups on file.  Malachy Mood, MD, [facility], [facility] 06/06/2021, 1:25 PM

## 2021-06-06 NOTE — Progress Notes (Signed)
Obstetric H&P   Chief Complaint: Mary Kramer  Prenatal Care Provider: [facility]  History of Present Illness: 36 y.o. G2P0010 63w1dby 06/12/2021, by Last [place]  [place], irregular contractions, no LOF, no VB.  She was seen two weeks ago for fetal tachycardia noted in clinic and had a reactive NST on L&D.  Fetal heart tones today in the 150's. She did have a likely viral gastroenteritis last week.    Her pregnancy has been uncomplicated.  She is a SMA carrier but husband is negative.  Prior history of LEEP.  AMA  Pregravid weight 126 lb (57.2 kg) Total Weight Gain 53 lb (24 kg)  Pregnancy 1 Problems (from 10/31/20 to present)     Problem Noted Resolved   Supervision of high-risk pregnancy 10/31/2020 by SMalachy Mood MD No   Overview Addendum 05/17/2021  4:30 PM by HGae Dry MD     Nursing Staff Provider  Office Location  [facility] Dating  LMP = 9 week [place] Language  English Anatomy [place] nml  Flu Vaccine   Genetic Screen  NIPS: Normal XY  TDaP vaccine   04/15/21 Hgb A1C or  GTT Third trimester : 158, 3 hour normal  Covid UTD   LAB RESULTS   Rhogam  Not needed Blood Type   A pos  Feeding Plan Breast Antibody  negative  Contraception Nexplanon Rubella  immune  Circumcision  RPR   NR  Pediatrician   HBsAg   negative  Support Person Husband KLanny HurstHIV  negative  Prenatal Classes  Varicella Immune    GBS  negative  BTL Consent No    VBAC Consent N/a Pap  202 NIL HPV neg    Hgb Electro      CF negative     SMA Positive (FOB negative)            Antepartum multigravida of advanced maternal age 71/12/2020 by SMalachy Mood MD No   Carrier of genetic disorder 10/31/2020 by SMalachy Mood MD No   Overview Signed 10/31/2020 12:44 PM by SMalachy Mood MD    SMA carrier significant other tested negative           Review of Systems: 10 point review of systems negative unless otherwise noted in HPI  Past Medical History: Patient Active  Problem List   Diagnosis Date Noted   Fetal tachycardia affecting management of mother 05/28/2021   History of loop electrosurgical excision procedure (LEEP) of cervix affecting pregnancy, antepartum 12/06/2020    Cervical exam and measurements normal    Supervision of high-risk pregnancy 10/31/2020     Nursing Staff Provider  Office Location  [facility] Dating  LMP = 9 week [place] Language  English Anatomy [place] nml  Flu Vaccine   Genetic Screen  NIPS: Normal XY  TDaP vaccine   04/15/21 Hgb A1C or  GTT Third trimester : 158, 3 hour normal  Covid UTD   LAB RESULTS   Rhogam  Not needed Blood Type   A pos  Feeding Plan Breast Antibody  negative  Contraception Nexplanon Rubella  immune  Circumcision  RPR   NR  Pediatrician   HBsAg   negative  Support Person Husband KLanny HurstHIV  negative  Prenatal Classes  Varicella Immune    GBS  (For PCN allergy, check sensitivities)   BTL Consent No    VBAC Consent N/a Pap  202 NIL HPV neg    Hgb Electro  CF negative     SMA Positive (FOB negative)          Antepartum multigravida of advanced maternal age 55/12/2020   Carrier of genetic disorder 10/31/2020    SMA carrier significant other tested negative    Asthma, stable, mild intermittent 01/07/2019   Controlled substance agreement signed 04/28/2017   Insomnia 08/10/2015   Vitamin D deficiency 08/10/2015   Migraine without aura 05/11/2015   ADHD (attention deficit hyperactivity disorder) 02/09/2015   Hypoglycemia 02/09/2015   Lump or mass in breast 11/15/2012    Past Surgical History: Past Surgical History:  Procedure Laterality Date   CERVICAL BIOPSY  W/ LOOP ELECTRODE EXCISION     DG HYSTEROGRAM (HSG)  07/30/2017        Past Obstetric History: # 1 - Date: None, Sex: None, Weight: None, GA: None, Delivery: None, Apgar1: None, Apgar5: None, Living: None, Birth Comments: None  # 2 - Date: None, Sex: None, Weight: None, GA: None, Delivery: None, Apgar1: None, Apgar5: None, Living: None,  Birth Comments: None   Past Gynecologic History:  Family History: Family History  Problem Relation Age of Onset   Anxiety disorder Mother    Migraines Mother    Hyperlipidemia Father    Transient ischemic attack Father    Hypertension Father    Stroke Father    Lung disease Father    Atrial fibrillation Maternal Grandmother    Cancer Paternal Grandmother        leukemia   Diabetes Paternal Grandfather    Cerebral aneurysm Paternal Aunt    Cerebral aneurysm Maternal Grandfather     Social History: Social History   Socioeconomic History   Marital status: Single    Spouse name: Not on file   Number of children: Not on file   Years of education: Not on file   Highest education level: Not on file  Occupational History   Not on file  Tobacco Use   Smoking status: Never   Smokeless tobacco: Never  Vaping Use   Vaping Use: Never used  Substance and Sexual Activity   Alcohol use: Not Currently    Comment: rarely   Drug use: No   Sexual activity: Yes    Birth control/protection: None  Other Topics Concern   Not on file  Social History Narrative   Not on file   Social Determinants of Health   Financial Resource Strain: Not on file  Food Insecurity: Not on file  Transportation Needs: Not on file  Physical Activity: Not on file  Stress: Not on file  Social Connections: Not on file  Intimate Partner Violence: Not on file    Medications: Prior to Admission medications   Medication Sig Start Date End Date Taking? Authorizing Provider  Prenatal Vit-Fe Fumarate-FA (PRENATAL MULTIVITAMIN) TABS tablet Take 1 tablet by mouth daily at 12 noon.   Yes [provider]  ondansetron (ZOFRAN-ODT) 4 MG disintegrating tablet Take 1 tablet (4 mg total) by mouth every 6 (six) hours as needed for nausea. 05/30/21   Rod Can, CNM    Allergies: Allergies  Allergen Reactions   Lexapro [Escitalopram Oxalate] Nausea Only   Hydrocodone Rash   Vicodin  [Hydrocodone-Acetaminophen] Rash    Physical Exam: Vitals: Blood pressure 132/87, weight 179 lb (81.2 kg), last menstrual period 09/05/2020.   FHT: 150  General: NAD HEENT: normocephalic, anicteric Pulmonary: No increased work of breathing Cardiovascular: RRR, distal pulses 2+ Abdomen: Gravid, non-tender Leopolds: vtx Genitourinary: 1/50/-3 Extremities: no edema, erythema, or  tenderness Neurologic: Grossly intact Psychiatric: mood appropriate, affect full  Labs: No results found for this or any previous visit (from the past 24 hour(s)).  Assessment: 36 y.o. G2P0010 84w1dby 06/12/2021, by Last Menstrual Period presenting for IOL scheduling  Plan: 1) The ARRIVE study was a national multicenter trial that randomized 6,106 patients to induction of labor at 39 weeks 0 days to 39 weeks 4 days (3,062) compared to expectant management (3,044).  There was no significant difference in adverse perinatal outcomes but there was a significantly lower rate of cesarean delivery, as well as lower rate of maternal hypertensive complications in the induction group.  Number to treat was calculated as 28 induction of labor to prevent 1 primary Cesarean section.  "Labor Induction versus Expectant Management in Nulliparous Low-Risk Women" The [place] [employer] iAugust 9, 2018 Vol. 379 No. 6  We discussed that expectant management to 41 weeks was also acceptable versus induction around 40 weeks if she would like to see if she goes into labor by her due date but prefers not to go over.  Patient elects to proceed with IOL when available with first date being 06/12/21.  Orders placed  2) Fetus - FHT 150  3) PNL - Blood type --/--/A POS (01/03 1621) / Anti-bodyscreen NEG (01/03 1621) / Rubella 1.64 (06/08 1127) / Varicella Immune / RPR Non Reactive (10/28 1003) / HBsAg Negative (06/08 1127) / HIV Non Reactive (10/28 1003) / 1-hr OGTT elevated with normal 3-hr / GBS Negative/-- (12/23 1630)  4)  Immunization History -  Immunization History  Administered Date(s) Administered   Influenza-Unspecified 03/26/2018   MMR 11/05/2018   Td 04/24/2008   Tdap 05/23/2015, 04/15/2021    5) Disposition -   AMalachy Mood MD, [facility], [facility] 06/06/2021, 1:19 PM

## 2021-06-06 NOTE — Progress Notes (Signed)
°  [facility] INDUCTION ASSESSMENT SCHEDULING Mary Kramer Oct 16, 1985 Medical record #: [id number] Phone #:  Home Phone [phone]  Mobile [phone]    Prenatal Provider:[facility] Delivering [facility] Proposed admission date/time:06/12/21 0000 Method of induction:Cytotec  Weight: Filed Weights01/12/23 1243Weight:179 lb (81.2 kg) BMI Body mass index is 32.74 kg/m. HIV Negative HSV Negative EDC Estimated Date of Delivery: 1/18/23based on:LMP  Gestational age on admission: [redacted]w[redacted]d Gravidity/parity:G2P0010  Cervix Score   0 1 2 3   Position Posterior Midposition Anterior   Consistency Firm Medium Soft   Effacement (%) 0-30 40-50 60-70 >80  Dilation (cm) Closed 1-2 3-4 >5  Babys station -3 -2 -1 +1, +2   Bishop Score:3   Medical induction of labor  select indication(s) below Elective induction ?39 weeks multiparous patient ?39 weeks primiparous patient with Bishop score ?7 ?40 weeks primiparous patient   Medical Indications Adapted from ACOG Committee Opinion #[id number], Medically Indicated Late Preterm and Early Term Deliveries, 2013.  PLACENTAL / UTERINE ISSUES FETAL ISSUES MATERNAL ISSUES  Placenta previa (36.0-37.6) Isoimmunization (37.0-38.6) Preeclampsia without severe features or gestational HTN (37.0)  Suspected accreta (34.0-35.6) Growth Restriction ([place]) Preeclampsia with severe features (34.0)  Prior classical CD, uterine window, rupture (36.0-37.6) Isolated (38.0-39.6) Chronic HTN (38.0-39.6)  Prior myomectomy (37.0-38.6) Concurrent findings (34.0-37.6) Cholestasis (37.0)  Umbilical vein varix (37.0) Growth Restriction (Twins) Diabetes  Placental abruption (chronic) Di-Di Isolated (36.0-37.6) Pregestational, controlled (39.0)  OBSTETRIC ISSUES Di-Di concurrent findings (32.0-34.6) Pregestational, uncontrolled (37.0-39.0)  Postdates ? (41 weeks) Mo-Di isolated (32.0-34.6) Pregestational, vascular compromise (37.0- 39.0)  PPROM  (34.0) Multiple Gestation Gestational, diet controlled (40.0)  Hx of IUFD (39.0 weeks) Di-Di (38.0-38.6) Gestational, med controlled (39.0)  Polyhydramnios, mild/moderate; SDV 8-16 or AFI 25-35 (39.0) Mo-Di (36.0-37.6) Gestational, uncontrolled (38.0-39.0)  Oligohydramnios (36.0-37.6); MVP <2 cm  For indications not listed above, delivery recommendations from maternal-fetal medicine consultant occurred on: N/A  Provider Signature: 08-12-1986 Scheduled Vena Austria Date:06/06/2021 1:28 PM   Call [phone] to finalize the induction date/time  [phone] (07/17)

## 2021-06-06 NOTE — Progress Notes (Signed)
ROB - requesting cervical exam. RM 3

## 2021-06-09 ENCOUNTER — Inpatient Hospital Stay: Payer: BC Managed Care – PPO | Admitting: Anesthesiology

## 2021-06-09 ENCOUNTER — Inpatient Hospital Stay
Admission: EM | Admit: 2021-06-09 | Discharge: 2021-06-12 | DRG: 787 | Disposition: A | Discharge status: Home / Self Care | Payer: BC Managed Care – PPO | Attending: Obstetrics and Gynecology | Admitting: Obstetrics and Gynecology

## 2021-06-09 ENCOUNTER — Other Ambulatory Visit: Payer: Self-pay

## 2021-06-09 ENCOUNTER — Encounter
Admission: EM | Disposition: A | Discharge status: Home / Self Care | Payer: Self-pay | Source: Home / Self Care | Attending: Obstetrics and Gynecology

## 2021-06-09 ENCOUNTER — Encounter: Payer: Self-pay | Admitting: Obstetrics and Gynecology

## 2021-06-09 DIAGNOSIS — O134 Gestational [pregnancy-induced] hypertension without significant proteinuria, complicating childbirth: Secondary | ICD-10-CM | POA: Diagnosis not present

## 2021-06-09 DIAGNOSIS — Z3A39 39 weeks gestation of pregnancy: Secondary | ICD-10-CM

## 2021-06-09 DIAGNOSIS — O9903 Anemia complicating the puerperium: Secondary | ICD-10-CM

## 2021-06-09 DIAGNOSIS — O9952 Diseases of the respiratory system complicating childbirth: Secondary | ICD-10-CM | POA: Diagnosis not present

## 2021-06-09 DIAGNOSIS — Z412 Encounter for routine and ritual male circumcision: Secondary | ICD-10-CM | POA: Diagnosis not present

## 2021-06-09 DIAGNOSIS — J45909 Unspecified asthma, uncomplicated: Secondary | ICD-10-CM | POA: Diagnosis not present

## 2021-06-09 DIAGNOSIS — O4202 Full-term premature rupture of membranes, onset of labor within 24 hours of rupture: Secondary | ICD-10-CM

## 2021-06-09 DIAGNOSIS — D62 Acute posthemorrhagic anemia: Secondary | ICD-10-CM | POA: Diagnosis not present

## 2021-06-09 DIAGNOSIS — O1494 Unspecified pre-eclampsia, complicating childbirth: Secondary | ICD-10-CM

## 2021-06-09 DIAGNOSIS — O26893 Other specified pregnancy related conditions, third trimester: Secondary | ICD-10-CM | POA: Diagnosis not present

## 2021-06-09 DIAGNOSIS — O99892 Other specified diseases and conditions complicating childbirth: Secondary | ICD-10-CM

## 2021-06-09 DIAGNOSIS — Z20822 Contact with and (suspected) exposure to covid-19: Secondary | ICD-10-CM | POA: Diagnosis not present

## 2021-06-09 DIAGNOSIS — O339 Maternal care for disproportion, unspecified: Secondary | ICD-10-CM | POA: Diagnosis not present

## 2021-06-09 DIAGNOSIS — O1404 Mild to moderate pre-eclampsia, complicating childbirth: Secondary | ICD-10-CM | POA: Diagnosis not present

## 2021-06-09 DIAGNOSIS — Z23 Encounter for immunization: Secondary | ICD-10-CM | POA: Diagnosis not present

## 2021-06-09 DIAGNOSIS — O9081 Anemia of the puerperium: Secondary | ICD-10-CM | POA: Diagnosis not present

## 2021-06-09 DIAGNOSIS — Z3490 Encounter for supervision of normal pregnancy, unspecified, unspecified trimester: Secondary | ICD-10-CM | POA: Diagnosis present

## 2021-06-09 LAB — PROTEIN / CREATININE RATIO, URINE
Creatinine, Urine: 105 mg/dL
Protein Creatinine Ratio: 0.3 mg/mg{Cre} — ABNORMAL HIGH (ref 0.00–0.15)
Total Protein, Urine: 32 mg/dL

## 2021-06-09 LAB — RESP PANEL BY RT-PCR (FLU A&B, COVID) ARPGX2
Influenza A by PCR: NEGATIVE
Influenza B by PCR: NEGATIVE
SARS Coronavirus 2 by RT PCR: NEGATIVE

## 2021-06-09 LAB — CBC
HCT: 43.9 % (ref 36.0–46.0)
HCT: 42.8 % (ref 36.0–46.0)
Hemoglobin: 14.9 g/dL (ref 12.0–15.0)
Hemoglobin: 14.5 g/dL (ref 12.0–15.0)
MCH: 29.6 pg (ref 26.0–34.0)
MCH: 29.7 pg (ref 26.0–34.0)
MCHC: 33.9 g/dL (ref 30.0–36.0)
MCHC: 33.9 g/dL (ref 30.0–36.0)
MCV: 87.1 fL (ref 80.0–100.0)
MCV: 87.5 fL (ref 80.0–100.0)
Platelets: 315 10*3/uL (ref 150–400)
Platelets: 313 10*3/uL (ref 150–400)
RBC: 5.04 MIL/uL (ref 3.87–5.11)
RBC: 4.89 MIL/uL (ref 3.87–5.11)
RDW: 13.5 % (ref 11.5–15.5)
RDW: 13.2 % (ref 11.5–15.5)
WBC: 13.3 10*3/uL — ABNORMAL HIGH (ref 4.0–10.5)
WBC: 13.8 10*3/uL — ABNORMAL HIGH (ref 4.0–10.5)
nRBC: 0 % (ref 0.0–0.2)
nRBC: 0 % (ref 0.0–0.2)

## 2021-06-09 LAB — COMPREHENSIVE METABOLIC PANEL
ALT: 26 U/L (ref 0–44)
AST: 24 U/L (ref 15–41)
Albumin: 3.4 g/dL — ABNORMAL LOW (ref 3.5–5.0)
Alkaline Phosphatase: 128 U/L — ABNORMAL HIGH (ref 38–126)
Anion gap: 7 (ref 5–15)
BUN: 11 mg/dL (ref 6–20)
CO2: 24 mmol/L (ref 22–32)
Calcium: 9 mg/dL (ref 8.9–10.3)
Chloride: 103 mmol/L (ref 98–111)
Creatinine, Ser: 0.71 mg/dL (ref 0.44–1.00)
GFR, Estimated: >60 mL/min (ref >60)
Glucose, Bld: 91 mg/dL (ref 70–99)
Potassium: 4.3 mmol/L (ref 3.5–5.1)
Sodium: 134 mmol/L — ABNORMAL LOW (ref 135–145)
Total Bilirubin: 0.3 mg/dL (ref 0.3–1.2)
Total Protein: 7.2 g/dL (ref 6.5–8.1)

## 2021-06-09 LAB — TYPE AND SCREEN
ABO/RH(D): A POS
Antibody Screen: NEGATIVE

## 2021-06-09 SURGERY — Surgical Case
Anesthesia: Epidural

## 2021-06-09 MED ORDER — SOD CITRATE-CITRIC ACID 500-334 MG/5ML PO SOLN: 30.0000 mL | ORAL | Status: DC | PRN

## 2021-06-09 MED ORDER — 0.9 % SODIUM CHLORIDE (POUR BTL) OPTIME
TOPICAL | Status: DC | PRN
  Administered 2021-06-09: 300 mL

## 2021-06-09 MED ORDER — IBUPROFEN 600 MG PO TABS
600.0000 mg | ORAL_TABLET | Freq: Four times a day (QID) | ORAL | Status: DC
  Administered 2021-06-10 – 2021-06-12 (×7): 600 mg via ORAL
  Filled 2021-06-09 (×7): qty 1

## 2021-06-09 MED ORDER — FENTANYL-BUPIVACAINE-NACL 0.5-0.125-0.9 MG/250ML-% EP SOLN
EPIDURAL | Status: AC
  Filled 2021-06-09: qty 250

## 2021-06-09 MED ORDER — EPHEDRINE 5 MG/ML INJ: 10.0000 mg | INTRAVENOUS | Status: DC | PRN

## 2021-06-09 MED ORDER — SODIUM CHLORIDE 0.9 % IV SOLN
500.0000 mg | Freq: Once | INTRAVENOUS | Status: AC
  Administered 2021-06-09: 500 mg via INTRAVENOUS
  Filled 2021-06-09: qty 5

## 2021-06-09 MED ORDER — OXYCODONE-ACETAMINOPHEN 5-325 MG PO TABS: 1.0000 | ORAL_TABLET | ORAL | Status: DC | PRN

## 2021-06-09 MED ORDER — OXYTOCIN BOLUS FROM INFUSION: 333.0000 mL | Freq: Once | INTRAVENOUS | Status: DC

## 2021-06-09 MED ORDER — TERBUTALINE SULFATE 1 MG/ML IJ SOLN
0.2500 mg | Freq: Once | INTRAMUSCULAR | Status: AC | PRN
  Administered 2021-06-09: 0.25 mg via SUBCUTANEOUS

## 2021-06-09 MED ORDER — BUPIVACAINE 0.25 % ON-Q PUMP DUAL CATH 400 ML
400.0000 mL | INJECTION | Status: DC
  Filled 2021-06-09: qty 400

## 2021-06-09 MED ORDER — SENNOSIDES-DOCUSATE SODIUM 8.6-50 MG PO TABS
2.0000 | ORAL_TABLET | ORAL | Status: DC
  Administered 2021-06-10 – 2021-06-11 (×3): 2 via ORAL
  Filled 2021-06-09 (×3): qty 2

## 2021-06-09 MED ORDER — LACTATED RINGERS IV SOLN: INTRAVENOUS | Status: DC

## 2021-06-09 MED ORDER — OXYTOCIN-SODIUM CHLORIDE 30-0.9 UT/500ML-% IV SOLN
2.5000 [IU]/h | INTRAVENOUS | Status: AC
  Administered 2021-06-09: 2.5 [IU]/h via INTRAVENOUS

## 2021-06-09 MED ORDER — PHENYLEPHRINE HCL (PRESSORS) 10 MG/ML IV SOLN
INTRAVENOUS | Status: DC | PRN
Start: 2021-06-09 — End: 2021-06-09
  Administered 2021-06-09: 3248 ug via INTRAVENOUS

## 2021-06-09 MED ORDER — KETOROLAC TROMETHAMINE 30 MG/ML IJ SOLN
30.0000 mg | Freq: Four times a day (QID) | INTRAMUSCULAR | Status: AC
  Administered 2021-06-09 – 2021-06-10 (×4): 30 mg via INTRAVENOUS
  Filled 2021-06-09 (×4): qty 1

## 2021-06-09 MED ORDER — SOD CITRATE-CITRIC ACID 500-334 MG/5ML PO SOLN
30.0000 mL | ORAL | Status: AC
  Administered 2021-06-09: 30 mL via ORAL

## 2021-06-09 MED ORDER — TERBUTALINE SULFATE 1 MG/ML IJ SOLN
INTRAMUSCULAR | Status: AC
  Filled 2021-06-09: qty 1

## 2021-06-09 MED ORDER — TERBUTALINE SULFATE 1 MG/ML IJ SOLN: 0.2500 mg | Freq: Once | INTRAMUSCULAR | Status: DC | PRN

## 2021-06-09 MED ORDER — PHENYLEPHRINE 40 MCG/ML (10ML) SYRINGE FOR IV PUSH (FOR BLOOD PRESSURE SUPPORT): 80.0000 ug | PREFILLED_SYRINGE | INTRAVENOUS | Status: DC | PRN

## 2021-06-09 MED ORDER — OXYTOCIN-SODIUM CHLORIDE 30-0.9 UT/500ML-% IV SOLN: 2.5000 [IU]/h | INTRAVENOUS | Status: DC

## 2021-06-09 MED ORDER — CEFAZOLIN SODIUM-DEXTROSE 2-4 GM/100ML-% IV SOLN
2.0000 g | INTRAVENOUS | Status: AC
  Administered 2021-06-09: 2 g via INTRAVENOUS
  Filled 2021-06-09: qty 100

## 2021-06-09 MED ORDER — BUPIVACAINE HCL (PF) 0.5 % IJ SOLN
INTRAMUSCULAR | Status: AC
  Filled 2021-06-09: qty 10

## 2021-06-09 MED ORDER — ONDANSETRON HCL 4 MG/2ML IJ SOLN
4.0000 mg | Freq: Four times a day (QID) | INTRAMUSCULAR | Status: DC | PRN
  Administered 2021-06-09: 4 mg via INTRAVENOUS
  Filled 2021-06-09: qty 2

## 2021-06-09 MED ORDER — SIMETHICONE 80 MG PO CHEW
80.0000 mg | CHEWABLE_TABLET | Freq: Three times a day (TID) | ORAL | Status: DC
  Administered 2021-06-10 – 2021-06-12 (×7): 80 mg via ORAL
  Filled 2021-06-09 (×7): qty 1

## 2021-06-09 MED ORDER — LIDOCAINE-EPINEPHRINE (PF) 1.5 %-1:200000 IJ SOLN
INTRAMUSCULAR | Status: DC | PRN
  Administered 2021-06-09: 4 mL via EPIDURAL

## 2021-06-09 MED ORDER — LACTATED RINGERS IV SOLN
500.0000 mL | Freq: Once | INTRAVENOUS | Status: AC
  Administered 2021-06-09: 500 mL via INTRAVENOUS

## 2021-06-09 MED ORDER — BUPIVACAINE HCL (PF) 0.5 % IJ SOLN
INTRAMUSCULAR | Status: DC | PRN
  Administered 2021-06-09: 10 mL

## 2021-06-09 MED ORDER — OXYTOCIN-SODIUM CHLORIDE 30-0.9 UT/500ML-% IV SOLN
2.5000 [IU]/h | INTRAVENOUS | Status: DC
  Administered 2021-06-09: 30 [IU] via INTRAVENOUS

## 2021-06-09 MED ORDER — ONDANSETRON HCL 4 MG/2ML IJ SOLN: 4.0000 mg | Freq: Four times a day (QID) | INTRAMUSCULAR | Status: DC | PRN

## 2021-06-09 MED ORDER — ACETAMINOPHEN 325 MG PO TABS
650.0000 mg | ORAL_TABLET | ORAL | Status: DC | PRN
  Administered 2021-06-09: 650 mg via ORAL
  Filled 2021-06-09: qty 2

## 2021-06-09 MED ORDER — LIDOCAINE HCL (PF) 1 % IJ SOLN: 30.0000 mL | INTRAMUSCULAR | Status: DC | PRN

## 2021-06-09 MED ORDER — SIMETHICONE 80 MG PO CHEW
80.0000 mg | CHEWABLE_TABLET | ORAL | Status: DC | PRN
  Administered 2021-06-10: 80 mg via ORAL
  Filled 2021-06-09: qty 1

## 2021-06-09 MED ORDER — ONDANSETRON HCL 4 MG/2ML IJ SOLN: 4.0000 mg | Freq: Once | INTRAMUSCULAR | Status: DC | PRN

## 2021-06-09 MED ORDER — BUPIVACAINE HCL (PF) 0.5 % IJ SOLN: 20.0000 mL | INTRAMUSCULAR | Status: DC

## 2021-06-09 MED ORDER — MENTHOL 3 MG MT LOZG
1.0000 | LOZENGE | OROMUCOSAL | Status: DC | PRN
  Filled 2021-06-09: qty 9

## 2021-06-09 MED ORDER — BUPIVACAINE HCL (PF) 0.25 % IJ SOLN
INTRAMUSCULAR | Status: DC | PRN
  Administered 2021-06-09: 5 mL via EPIDURAL
  Administered 2021-06-09: 4 mL via EPIDURAL

## 2021-06-09 MED ORDER — LIDOCAINE HCL (PF) 1 % IJ SOLN
INTRAMUSCULAR | Status: DC | PRN
  Administered 2021-06-09: 3 mL via SUBCUTANEOUS

## 2021-06-09 MED ORDER — DIPHENHYDRAMINE HCL 50 MG/ML IJ SOLN: 12.5000 mg | INTRAMUSCULAR | Status: DC | PRN

## 2021-06-09 MED ORDER — ACETAMINOPHEN 325 MG PO TABS: 650.0000 mg | ORAL_TABLET | ORAL | Status: DC | PRN

## 2021-06-09 MED ORDER — DIPHENHYDRAMINE HCL 25 MG PO CAPS: 25.0000 mg | ORAL_CAPSULE | Freq: Four times a day (QID) | ORAL | Status: DC | PRN

## 2021-06-09 MED ORDER — BUTORPHANOL TARTRATE 1 MG/ML IJ SOLN
1.0000 mg | INTRAMUSCULAR | Status: DC | PRN
  Administered 2021-06-09 (×2): 1 mg via INTRAVENOUS
  Filled 2021-06-09 (×2): qty 1

## 2021-06-09 MED ORDER — ONDANSETRON HCL 4 MG/2ML IJ SOLN
INTRAMUSCULAR | Status: DC | PRN
Start: 2021-06-09 — End: 2021-06-09
  Administered 2021-06-09: 4 mg via INTRAVENOUS

## 2021-06-09 MED ORDER — MISOPROSTOL 25 MCG QUARTER TABLET: 25.0000 ug | ORAL_TABLET | ORAL | Status: DC | PRN

## 2021-06-09 MED ORDER — DIBUCAINE (PERIANAL) 1 % EX OINT: 1.0000 "application " | TOPICAL_OINTMENT | CUTANEOUS | Status: DC | PRN

## 2021-06-09 MED ORDER — LACTATED RINGERS IV SOLN
500.0000 mL | INTRAVENOUS | Status: DC | PRN
  Administered 2021-06-09: 1000 mL via INTRAVENOUS
  Administered 2021-06-09: 500 mL via INTRAVENOUS

## 2021-06-09 MED ORDER — PRENATAL MULTIVITAMIN CH
1.0000 | ORAL_TABLET | Freq: Every day | ORAL | Status: DC
  Administered 2021-06-10 – 2021-06-11 (×2): 1 via ORAL
  Filled 2021-06-09 (×2): qty 1

## 2021-06-09 MED ORDER — FENTANYL CITRATE (PF) 100 MCG/2ML IJ SOLN: 25.0000 ug | INTRAMUSCULAR | Status: DC | PRN

## 2021-06-09 MED ORDER — LACTATED RINGERS IV SOLN: 500.0000 mL | INTRAVENOUS | Status: DC | PRN

## 2021-06-09 MED ORDER — SOD CITRATE-CITRIC ACID 500-334 MG/5ML PO SOLN
ORAL | Status: AC
  Filled 2021-06-09: qty 15

## 2021-06-09 MED ORDER — OXYTOCIN-SODIUM CHLORIDE 30-0.9 UT/500ML-% IV SOLN
1.0000 m[IU]/min | INTRAVENOUS | Status: DC
  Administered 2021-06-09: 2 m[IU]/min via INTRAVENOUS

## 2021-06-09 MED ORDER — FENTANYL-BUPIVACAINE-NACL 0.5-0.125-0.9 MG/250ML-% EP SOLN
12.0000 mL/h | EPIDURAL | Status: DC | PRN
  Administered 2021-06-09: 12 mL/h via EPIDURAL

## 2021-06-09 MED ORDER — COCONUT OIL OIL: 1.0000 "application " | TOPICAL_OIL | Status: DC | PRN

## 2021-06-09 MED ORDER — BUPIVACAINE ON-Q PAIN PUMP (FOR ORDER SET NO CHG): INJECTION | Status: DC

## 2021-06-09 MED ORDER — LIDOCAINE HCL (PF) 2 % IJ SOLN
INTRAMUSCULAR | Status: DC | PRN
  Administered 2021-06-09 (×2): 100 mg via EPIDURAL
  Administered 2021-06-09: 60 mg via EPIDURAL
  Administered 2021-06-09: 100 mg via EPIDURAL

## 2021-06-09 MED ORDER — WITCH HAZEL-GLYCERIN EX PADS: 1.0000 "application " | MEDICATED_PAD | CUTANEOUS | Status: DC | PRN

## 2021-06-09 MED ORDER — LIDOCAINE HCL 2 % IJ SOLN
INTRAMUSCULAR | Status: AC
  Filled 2021-06-09: qty 20

## 2021-06-09 SURGICAL SUPPLY — 33 items
BAG COUNTER SPONGE SURGICOUNT (BAG) ×2 IMPLANT
CATH KIT ON-Q SILVERSOAK 5 (CATHETERS) ×2 IMPLANT
CATH KIT ON-Q SILVERSOAK 5IN (CATHETERS) ×4 IMPLANT
CHLORAPREP W/TINT 26 (MISCELLANEOUS) ×4 IMPLANT
DERMABOND ADVANCED (GAUZE/BANDAGES/DRESSINGS) ×1
DERMABOND ADVANCED .7 DNX12 (GAUZE/BANDAGES/DRESSINGS) ×1 IMPLANT
DRSG OPSITE POSTOP 4X10 (GAUZE/BANDAGES/DRESSINGS) ×2 IMPLANT
DRSG TELFA 3X8 NADH (GAUZE/BANDAGES/DRESSINGS) ×2 IMPLANT
ELECT CAUTERY BLADE 6.4 (BLADE) ×2 IMPLANT
ELECT REM PT RETURN 9FT ADLT (ELECTROSURGICAL) ×2
ELECTRODE REM PT RTRN 9FT ADLT (ELECTROSURGICAL) ×1 IMPLANT
GAUZE SPONGE 4X4 12PLY STRL (GAUZE/BANDAGES/DRESSINGS) ×2 IMPLANT
GLOVE SURG ENC MOIS LTX SZ7 (GLOVE) ×2 IMPLANT
GLOVE SURG UNDER LTX SZ7.5 (GLOVE) ×2 IMPLANT
GOWN STRL REUS W/ TWL LRG LVL3 (GOWN DISPOSABLE) ×3 IMPLANT
GOWN STRL REUS W/TWL LRG LVL3 (GOWN DISPOSABLE) ×3
MANIFOLD NEPTUNE II (INSTRUMENTS) ×2 IMPLANT
MAT PREVALON FULL STRYKER (MISCELLANEOUS) ×2 IMPLANT
NS IRRIG 1000ML POUR BTL (IV SOLUTION) ×2 IMPLANT
PACK C SECTION AR (MISCELLANEOUS) ×2 IMPLANT
PAD DRESSING TELFA 3X8 NADH (GAUZE/BANDAGES/DRESSINGS) ×1 IMPLANT
PAD OB MATERNITY 4.3X12.25 (PERSONAL CARE ITEMS) ×2 IMPLANT
PAD PREP 24X41 OB/GYN DISP (PERSONAL CARE ITEMS) ×2 IMPLANT
PENCIL SMOKE EVACUATOR (MISCELLANEOUS) ×2 IMPLANT
SCRUB EXIDINE 4% CHG 4OZ (MISCELLANEOUS) ×2 IMPLANT
STAPLER INSORB 30 2030 C-SECTI (MISCELLANEOUS) ×1 IMPLANT
STRIP CLOSURE SKIN 1/2X4 (GAUZE/BANDAGES/DRESSINGS) ×2 IMPLANT
SUT MNCRL AB 4-0 PS2 18 (SUTURE) ×2 IMPLANT
SUT PDS AB 1 TP1 96 (SUTURE) ×4 IMPLANT
SUT VIC AB 0 CTX 36 (SUTURE) ×2
SUT VIC AB 0 CTX36XBRD ANBCTRL (SUTURE) ×2 IMPLANT
SUT VIC AB 2-0 CT1 36 (SUTURE) ×2 IMPLANT
WATER STERILE IRR 500ML POUR (IV SOLUTION) ×2 IMPLANT

## 2021-06-09 NOTE — OB Triage Note (Signed)
Bonney Aid, MD notified of patient's arrival and chief complaint. SBAR given. MD acknowledged receipt of report and new orders written. Will notify patient on plan of care.

## 2021-06-09 NOTE — Anesthesia Preprocedure Evaluation (Signed)
Anesthesia Evaluation  Patient identified by MRN, date of birth, ID band Patient awake    Reviewed: Allergy & Precautions, H&P , NPO status , Patient's Chart, lab work & pertinent test results, reviewed documented beta blocker date and time   Airway Mallampati: II  TM Distance: >3 FB Neck ROM: full    Dental no notable dental hx. (+) Teeth Intact   Pulmonary asthma ,    Pulmonary exam normal breath sounds clear to auscultation       Cardiovascular Exercise Tolerance: Good hypertension,  Rhythm:regular Rate:Normal     Neuro/Psych  Headaches, negative psych ROS   GI/Hepatic negative GI ROS, Neg liver ROS,   Endo/Other  negative endocrine ROSdiabetes, Gestational  Renal/GU      Musculoskeletal   Abdominal   Peds  Hematology negative hematology ROS (+)   Anesthesia Other Findings   Reproductive/Obstetrics (+) Pregnancy                             Anesthesia Physical Anesthesia Plan  ASA: 2  Anesthesia Plan: Epidural   Post-op Pain Management:    Induction:   PONV Risk Score and Plan:   Airway Management Planned:   Additional Equipment:   Intra-op Plan:   Post-operative Plan:   Informed Consent: I have reviewed the patients History and Physical, chart, labs and discussed the procedure including the risks, benefits and alternatives for the proposed anesthesia with the patient or authorized representative who has indicated his/her understanding and acceptance.       Plan Discussed with:   Anesthesia Plan Comments:         Anesthesia Quick Evaluation

## 2021-06-09 NOTE — Transfer of Care (Signed)
Immediate Anesthesia Transfer of Care Note  Patient: Mary Kramer  Procedure(s) Performed: CESAREAN SECTION  Patient Location: PACU  Anesthesia Type:Spinal and Epidural  Level of Consciousness: awake  Airway & Oxygen Therapy: Patient Spontanous Breathing  Post-op Assessment: Report given to RN  Post vital signs: stable  Last Vitals:  Vitals Value Taken Time  BP    Temp    Pulse    Resp    SpO2      Last Pain:  Vitals:   06/09/21 1446  TempSrc: Oral  PainSc:       Patients Stated Pain Goal: 0 (06/09/21 1038)  Complications: No notable events documented.

## 2021-06-09 NOTE — Progress Notes (Addendum)
Subjective:  Comfortable, epidural in place  Objective:   Vitals: Blood pressure (!) 113/59, pulse 85, temperature 98 F (36.7 C), temperature source Oral, resp. rate 18, height 5\' 1"  (1.549 m), weight 81.2 kg, last menstrual period 09/05/2020, SpO2 98 %. General: NAD Abdomen: gravdi, non-tender Cervical Exam:  Dilation: 7.5 Effacement (%): 90 Station: -2 Presentation: Vertex Exam by:: J Coble RN  FHT: 120, moderate, +accels, no decels Toco: q5-1min  Results for orders placed or performed during the hospital encounter of 06/09/21 (from the past 24 hour(s))  CBC     Status: Abnormal   Collection Time: 06/09/21  6:56 AM  Result Value Ref Range   WBC 13.3 (H) 4.0 - 10.5 K/uL   RBC 5.04 3.87 - 5.11 MIL/uL   Hemoglobin 14.9 12.0 - 15.0 g/dL   HCT 06/11/21 64.6 - 80.3 %   MCV 87.1 80.0 - 100.0 fL   MCH 29.6 26.0 - 34.0 pg   MCHC 33.9 30.0 - 36.0 g/dL   RDW 21.2 24.8 - 25.0 %   Platelets 315 150 - 400 K/uL   nRBC 0.0 0.0 - 0.2 %  Comprehensive metabolic panel     Status: Abnormal   Collection Time: 06/09/21  6:56 AM  Result Value Ref Range   Sodium 134 (L) 135 - 145 mmol/L   Potassium 4.3 3.5 - 5.1 mmol/L   Chloride 103 98 - 111 mmol/L   CO2 24 22 - 32 mmol/L   Glucose, Bld 91 70 - 99 mg/dL   BUN 11 6 - 20 mg/dL   Creatinine, Ser 06/11/21 0.44 - 1.00 mg/dL   Calcium 9.0 8.9 - 0.48 mg/dL   Total Protein 7.2 6.5 - 8.1 g/dL   Albumin 3.4 (L) 3.5 - 5.0 g/dL   AST 24 15 - 41 U/L   ALT 26 0 - 44 U/L   Alkaline Phosphatase 128 (H) 38 - 126 U/L   Total Bilirubin 0.3 0.3 - 1.2 mg/dL   GFR, Estimated 88.9 >16 mL/min   Anion gap 7 5 - 15  Sample to Blood Bank     Status: None (Preliminary result)   Collection Time: 06/09/21  6:56 AM  Result Value Ref Range   Blood Bank Specimen      SAMPLE REJECTED DUE TO IMPROPER COLLECTION/REORDER AND RECOLLECT IF NEEDED   Sample Expiration      06/12/2021,2359 Performed at [facility], [address] [place], [place]    Protein / creatinine ratio, urine     Status: Abnormal   Collection Time: 06/09/21  7:28 AM  Result Value Ref Range   Creatinine, Urine 105 mg/dL   Total Protein, Urine 32 mg/dL   Protein Creatinine Ratio 0.30 (H) 0.00 - 0.15 mg/mg[Cre]  Type and screen [facility]     Status: None   Collection Time: 06/09/21  9:23 AM  Result Value Ref Range   ABO/RH(D) A POS    Antibody Screen NEG    Sample Expiration      06/12/2021,2359 Performed at [facility], [address] [place], [place]   CBC     Status: Abnormal   Collection Time: 06/09/21  9:23 AM  Result Value Ref Range   WBC 13.8 (H) 4.0 - 10.5 K/uL   RBC 4.89 3.87 - 5.11 MIL/uL   Hemoglobin 14.5 12.0 - 15.0 g/dL   HCT 06/11/21 34.9 - 17.9 %   MCV 87.5 80.0 - 100.0 fL   MCH 29.7  26.0 - 34.0 pg   MCHC 33.9 30.0 - 36.0 g/dL   RDW 74.0 81.4 - 48.1 %   Platelets 313 150 - 400 K/uL   nRBC 0.0 0.0 - 0.2 %  Resp Panel by RT-PCR (Flu A&B, Covid) Nasopharyngeal Swab     Status: None   Collection Time: 06/09/21  9:24 AM   Specimen: Nasopharyngeal Swab; Nasopharyngeal(NP) swabs in vial transport medium  Result Value Ref Range   SARS Coronavirus 2 by RT PCR NEGATIVE NEGATIVE   Influenza A by PCR NEGATIVE NEGATIVE   Influenza B by PCR NEGATIVE NEGATIVE    Assessment:   36 y.o. G2P0010 [redacted]w[redacted]d term labor SROM, preeclampsia without severe features  Plan:   1) Labor - add pitocin for augmentation no changes since last check, IUPC placed  2) Fetus - cat I currently did have one 49mm decel around 1300  3) Preeclampsia without severe features - normotensive since epidural.  Asymptomatic   Vena Austria, MD, [facility], [facility] 06/09/2021, 2:14 PM

## 2021-06-09 NOTE — Progress Notes (Signed)
Subjective:  Pushing for the past 2-hr to +2 station but no movement below the pubic symphysis.    Objective:   Vitals: Blood pressure 117/64, pulse 81, temperature 98 F (36.7 C), temperature source Oral, resp. rate 18, height 5\' 1"  (1.549 m), weight 81.2 kg, last menstrual period 09/05/2020, SpO2 99 %. General: NAD Abdomen: gravid, non-tender Cervical Exam:  Dilation: 10 Dilation Complete Date: 06/09/21 Dilation Complete Time: 1610 Effacement (%): 100 Station: Plus 2 Presentation: Vertex Exam by:: Vera Wishart MD Patient pushed to +2 station but was having consistent late decelerations.  Position noted to be ROA <15 degree off midline,total weight gain this pregnancy  24 kg, 1-hr 158 with 3-hr 4/4 normal.  Long simpson applied,  blades articulated without difficulty, shank noted to be in line with midline suture.  With two contractions with good maternal effort, the vertex was flexed and traction applied with no movement underneath the pubic symphisis.  Forceps disarticulated.  Discussed fetal heart rate tracing.     FHT: 140, moderate, +accels with scalp stim, late decelerations present Toco: q62min  Results for orders placed or performed during the hospital encounter of 06/09/21 (from the past 24 hour(s))  CBC     Status: Abnormal   Collection Time: 06/09/21  6:56 AM  Result Value Ref Range   WBC 13.3 (H) 4.0 - 10.5 K/uL   RBC 5.04 3.87 - 5.11 MIL/uL   Hemoglobin 14.9 12.0 - 15.0 g/dL   HCT 06/11/21 21.2 - 24.8 %   MCV 87.1 80.0 - 100.0 fL   MCH 29.6 26.0 - 34.0 pg   MCHC 33.9 30.0 - 36.0 g/dL   RDW 25.0 03.7 - 04.8 %   Platelets 315 150 - 400 K/uL   nRBC 0.0 0.0 - 0.2 %  Comprehensive metabolic panel     Status: Abnormal   Collection Time: 06/09/21  6:56 AM  Result Value Ref Range   Sodium 134 (L) 135 - 145 mmol/L   Potassium 4.3 3.5 - 5.1 mmol/L   Chloride 103 98 - 111 mmol/L   CO2 24 22 - 32 mmol/L   Glucose, Bld 91 70 - 99 mg/dL   BUN 11 6 - 20 mg/dL   Creatinine, Ser  06/11/21 0.44 - 1.00 mg/dL   Calcium 9.0 8.9 - 1.69 mg/dL   Total Protein 7.2 6.5 - 8.1 g/dL   Albumin 3.4 (L) 3.5 - 5.0 g/dL   AST 24 15 - 41 U/L   ALT 26 0 - 44 U/L   Alkaline Phosphatase 128 (H) 38 - 126 U/L   Total Bilirubin 0.3 0.3 - 1.2 mg/dL   GFR, Estimated 45.0 >38 mL/min   Anion gap 7 5 - 15  Sample to Blood Bank     Status: None (Preliminary result)   Collection Time: 06/09/21  6:56 AM  Result Value Ref Range   Blood Bank Specimen      SAMPLE REJECTED DUE TO IMPROPER COLLECTION/REORDER AND RECOLLECT IF NEEDED   Sample Expiration      06/12/2021,2359 Performed at [facility], [address] [place], [place]   Protein / creatinine ratio, urine     Status: Abnormal   Collection Time: 06/09/21  7:28 AM  Result Value Ref Range   Creatinine, Urine 105 mg/dL   Total Protein, Urine 32 mg/dL   Protein Creatinine Ratio 0.30 (H) 0.00 - 0.15 mg/mg[Cre]  Type and screen [facility]     Status: None   Collection Time:  06/09/21  9:23 AM  Result Value Ref Range   ABO/RH(D) A POS    Antibody Screen NEG    Sample Expiration      06/12/2021,2359 Performed at [facility], [address] [place], [place] [address]   CBC     Status: Abnormal   Collection Time: 06/09/21  9:23 AM  Result Value Ref Range   WBC 13.8 (H) 4.0 - 10.5 K/uL   RBC 4.89 3.87 - 5.11 MIL/uL   Hemoglobin 14.5 12.0 - 15.0 g/dL   HCT 94.8 01.6 - 55.3 %   MCV 87.5 80.0 - 100.0 fL   MCH 29.7 26.0 - 34.0 pg   MCHC 33.9 30.0 - 36.0 g/dL   RDW 74.8 27.0 - 78.6 %   Platelets 313 150 - 400 K/uL   nRBC 0.0 0.0 - 0.2 %  Resp Panel by RT-PCR (Flu A&B, Covid) Nasopharyngeal Swab     Status: None   Collection Time: 06/09/21  9:24 AM   Specimen: Nasopharyngeal Swab; Nasopharyngeal(NP) swabs in vial transport medium  Result Value Ref Range   SARS Coronavirus 2 by RT PCR NEGATIVE NEGATIVE   Influenza A by PCR NEGATIVE NEGATIVE   Influenza B by PCR NEGATIVE NEGATIVE     Assessment:   36 y.o. G2P0010 [redacted]w[redacted]d SROM term labor with category II tracing, failed attempt at forceps  Plan:   1) Labor - failed trial of forceps.  The patient was counseled regarding risk and benefits to proceeding with Cesarean section to expedite delivery.  Risk of cesarean section were discussed including risk of bleeding and need for potential intraoperative or postoperative blood transfusion with a rate of approximately 5% quoted for all Cesarean sections, risk of injury to adjacent organs including but not limited to bowl and bladder, the need for additional surgical procedures to address such injuries, and the risk of infection.  The risk of continued attempts at vaginal delivery include but are note limited to worsening fetal or maternal status.  After consideration of options the patient is amenable to proceed with primary cesarean section for delivery.   2) Fetus - cat II tracing,  good variability.  Stop pitocin, no further attempts at pushing  Vena Austria, MD, [facility], [facility] 06/09/2021, 6:16 PM

## 2021-06-09 NOTE — Anesthesia Procedure Notes (Signed)
Epidural Patient location during procedure: OB  Staffing Anesthesiologist: Piscitello, Joseph K, MD Performed: anesthesiologist   Preanesthetic Checklist Completed: patient identified, IV checked, site marked, risks and benefits discussed, surgical consent, monitors and equipment checked, pre-op evaluation and timeout performed  Epidural Patient position: sitting Prep: ChloraPrep Patient monitoring: heart rate, continuous pulse ox and blood pressure Approach: midline Location: L3-L4 Injection technique: LOR saline  Needle:  Needle type: Tuohy  Needle gauge: 17 G Needle length: 9 cm and 9 Needle insertion depth: 5 cm Catheter type: closed end flexible Catheter size: 19 Gauge Catheter at skin depth: 10 cm Test dose: negative and 1.5% lidocaine with Epi 1:200 K  Assessment Sensory level: T10 Events: blood not aspirated, injection not painful, no injection resistance, no paresthesia and negative IV test  Additional Notes 1st attempt Pt. Evaluated and documentation done after procedure finished. Patient identified. Risks/Benefits/Options discussed with patient including but not limited to bleeding, infection, nerve damage, paralysis, failed block, incomplete pain control, headache, blood pressure changes, nausea, vomiting, reactions to medication both or allergic, itching and postpartum back pain. Confirmed with bedside nurse the patient's most recent platelet count. Confirmed with patient that they are not currently taking any anticoagulation, have any bleeding history or any family history of bleeding disorders. Patient expressed understanding and wished to proceed. All questions were answered. Sterile technique was used throughout the entire procedure. Please see nursing notes for vital signs. Test dose was given through epidural catheter and negative prior to continuing to dose epidural or start infusion. Warning signs of high block given to the patient including shortness of  breath, tingling/numbness in hands, complete motor block, or any concerning symptoms with instructions to call for help. Patient was given instructions on fall risk and not to get out of bed. All questions and concerns addressed with instructions to call with any issues or inadequate analgesia.    Patient tolerated the insertion well without immediate complications.Reason for block:procedure for pain    

## 2021-06-09 NOTE — H&P (Signed)
Chief Complaint: [place]   Prenatal Care Provider: [facility]   History of Present Illness: 36 y.o. G2P0010 70w4dby 06/12/2021, by Last Menstrual Period presenting for painful contractions.  Initial cervical check no change.  She did report a gush of fluid during evaluation with what appears to be gross SROM.  Her BP was mildly elevated on initial presentation, asymptomatic, P/C ratio 300.  +FM,  no VB.  She was seen two weeks ago for fetal tachycardia noted in clinic and had a reactive NST on L&D.  Fetal heart tones today in the 150's. She did have a likely viral gastroenteritis last week.    Her pregnancy has been uncomplicated.  She is a SMA carrier but husband is negative.  Prior history of LEEP.  AMA   Pregravid weight 126 lb (57.2 kg) Total Weight Gain 53 lb (24 kg)   Pregnancy 1 Problems (from 10/31/20 to present)       Problem Noted Resolved    Supervision of high-risk pregnancy 10/31/2020 by SMalachy Mood MD No    Overview Addendum 05/17/2021  4:30 PM by HGae Dry MD             Nursing Staff Provider  Office Location  [facility] Dating  LMP = 9 week [place] Language  English Anatomy [place] nml  Flu Vaccine    Genetic Screen  NIPS: Normal XY  TDaP vaccine   04/15/21 Hgb A1C or  GTT Third trimester : 158, 3 hour normal  Covid UTD   LAB RESULTS   Rhogam  Not needed Blood Type   A pos  Feeding Plan Breast Antibody  negative  Contraception Nexplanon Rubella  immune  Circumcision   RPR   NR  Pediatrician    HBsAg   negative  Support Person Husband KLanny HurstHIV  negative  Prenatal Classes   Varicella Immune      GBS  negative  BTL Consent No      VBAC Consent N/a Pap  202 NIL HPV neg      Hgb Electro         CF negative      SMA Positive (FOB negative)                 Antepartum multigravida of advanced maternal age 19/12/2020 by SMalachy Mood MD No    Carrier of genetic disorder 10/31/2020 by SMalachy Mood MD No    Overview Signed 10/31/2020 12:44 PM by SMalachy Mood MD       SMA carrier significant other tested negative               Review of Systems: 10 point review of systems negative unless otherwise noted in HPI   Past Medical History:      Patient Active Problem List    Diagnosis Date Noted   Fetal tachycardia affecting management of mother 05/28/2021   History of loop electrosurgical excision procedure (LEEP) of cervix affecting pregnancy, antepartum 12/06/2020      Cervical exam and measurements normal     Supervision of high-risk pregnancy 10/31/2020             Nursing Staff Provider  Office Location  [facility] Dating  LMP = 9 week [place] Language  English Anatomy [place] nml  Flu Vaccine    Genetic Screen  NIPS: Normal XY  TDaP vaccine   04/15/21 Hgb A1C or  GTT Third trimester : 158, 3 hour normal  Covid UTD   LAB  RESULTS   Rhogam  Not needed Blood Type   A pos  Feeding Plan Breast Antibody  negative  Contraception Nexplanon Rubella  immune  Circumcision   RPR   NR  Pediatrician    HBsAg   negative  Support Person Husband Lanny Hurst HIV  negative  Prenatal Classes   Varicella Immune      GBS  (For PCN allergy, check sensitivities)   BTL Consent No      VBAC Consent N/a Pap  202 NIL HPV neg      Hgb Electro         CF negative      SMA Positive (FOB negative)              Antepartum multigravida of advanced maternal age 63/12/2020   Carrier of genetic disorder 10/31/2020      SMA carrier significant other tested negative     Asthma, stable, mild intermittent 01/07/2019   Controlled substance agreement signed 04/28/2017   Insomnia 08/10/2015   Vitamin D deficiency 08/10/2015   Migraine without aura 05/11/2015   ADHD (attention deficit hyperactivity disorder) 02/09/2015   Hypoglycemia 02/09/2015   Lump or mass in breast 11/15/2012      Past Surgical History:      Past Surgical History:  Procedure Laterality Date   CERVICAL BIOPSY  W/ LOOP ELECTRODE EXCISION       DG HYSTEROGRAM (HSG)   07/30/2017           Past Obstetric  History: # 1 - Date: None, Sex: None, Weight: None, GA: None, Delivery: None, Apgar1: None, Apgar5: None, Living: None, Birth Comments: None   # 2 - Date: None, Sex: None, Weight: None, GA: None, Delivery: None, Apgar1: None, Apgar5: None, Living: None, Birth Comments: None     Past Gynecologic History:   Family History:      Family History  Problem Relation Age of Onset   Anxiety disorder Mother     Migraines Mother     Hyperlipidemia Father     Transient ischemic attack Father     Hypertension Father     Stroke Father     Lung disease Father     Atrial fibrillation Maternal Grandmother     Cancer Paternal Grandmother          leukemia   Diabetes Paternal Grandfather     Cerebral aneurysm Paternal Aunt     Cerebral aneurysm Maternal Grandfather        Social History: Social History         Socioeconomic History   Marital status: Single      Spouse name: Not on file   Number of children: Not on file   Years of education: Not on file   Highest education level: Not on file  Occupational History   Not on file  Tobacco Use   Smoking status: Never   Smokeless tobacco: Never  Vaping Use   Vaping Use: Never used  Substance and Sexual Activity   Alcohol use: Not Currently      Comment: rarely   Drug use: No   Sexual activity: Yes      Birth control/protection: None  Other Topics Concern   Not on file  Social History Narrative   Not on file    Social Determinants of Health    Financial Resource Strain: Not on file  Food Insecurity: Not on file  Transportation Needs: Not on file  Physical Activity: Not on file  Stress:  Not on file  Social Connections: Not on file  Intimate Partner Violence: Not on file      Medications:        Prior to Admission medications   Medication Sig Start Date End Date Taking? Authorizing Provider  Prenatal Vit-Fe Fumarate-FA (PRENATAL MULTIVITAMIN) TABS tablet Take 1 tablet by mouth daily at 12 noon.     Yes [provider]  ondansetron (ZOFRAN-ODT) 4 MG disintegrating tablet Take 1 tablet (4 mg total) by mouth every 6 (six) hours as needed for nausea. 05/30/21     Rod Can, CNM      Allergies:     Allergies  Allergen Reactions   Lexapro [Escitalopram Oxalate] Nausea Only   Hydrocodone Rash   Vicodin [Hydrocodone-Acetaminophen] Rash      Physical Exam: Temp:  [98.1 F (36.7 C)-98.2 F (36.8 C)] 98.1 F (36.7 C) (01/15 0725) Pulse Rate:  [80-87] 87 (01/15 0725) Resp:  [16-18] 18 (01/15 0725) BP: (116-143)/(68-84) 132/76 (01/15 0725)      FHT: 125, moderate, +accels, no decels Toco q2-61mn   General: NAD HEENT: normocephalic, anicteric Pulmonary: No increased work of breathing Cardiovascular: RRR, distal pulses 2+ Abdomen: Gravid, non-tender [place]:  Dilation: 1 Effacement (%): 70 Station: -3 Presentation: Vertex Exam by:: D. MEans, RN  Genitourinary: 1/50/-3 Extremities: no edema, erythema, or tenderness Neurologic: Grossly intact Psychiatric: mood appropriate, affect full   Labs: Results for orders placed or performed during the hospital encounter of 06/09/21 (from the past 24 hour(s))  CBC     Status: Abnormal   Collection Time: 06/09/21  6:56 AM  Result Value Ref Range   WBC 13.3 (H) 4.0 - 10.5 K/uL   RBC 5.04 3.87 - 5.11 MIL/uL   Hemoglobin 14.9 12.0 - 15.0 g/dL   HCT 43.9 36.0 - 46.0 %   MCV 87.1 80.0 - 100.0 fL   MCH 29.6 26.0 - 34.0 pg   MCHC 33.9 30.0 - 36.0 g/dL   RDW 13.5 11.5 - 15.5 %   Platelets 315 150 - 400 K/uL   nRBC 0.0 0.0 - 0.2 %  Comprehensive metabolic panel     Status: Abnormal   Collection Time: 06/09/21  6:56 AM  Result Value Ref Range   Sodium 134 (L) 135 - 145 mmol/L   Potassium 4.3 3.5 - 5.1 mmol/L   Chloride 103 98 - 111 mmol/L   CO2 24 22 - 32 mmol/L   Glucose, Bld 91 70 - 99 mg/dL   BUN 11 6 - 20 mg/dL   Creatinine, Ser 0.71 0.44 - 1.00 mg/dL   Calcium 9.0 8.9 - 10.3 mg/dL   Total Protein 7.2 6.5 - 8.1 g/dL   Albumin 3.4 (L) 3.5  - 5.0 g/dL   AST 24 15 - 41 U/L   ALT 26 0 - 44 U/L   Alkaline Phosphatase 128 (H) 38 - 126 U/L   Total Bilirubin 0.3 0.3 - 1.2 mg/dL   GFR, Estimated >60 >60 mL/min   Anion gap 7 5 - 15  Sample to Blood Bank     Status: None (Preliminary result)   Collection Time: 06/09/21  6:56 AM  Result Value Ref Range   Blood Bank Specimen      SAMPLE REJECTED DUE TO IMPROPER COLLECTION/REORDER AND RECOLLECT IF NEEDED   Sample Expiration      06/12/2021,2359 Performed at [facility] [address] [place] [id number]  Protein / creatinine ratio, urine     Status: Abnormal  Collection Time: 06/09/21  7:28 AM  Result Value Ref Range   Creatinine, Urine 105 mg/dL   Total Protein, Urine 32 mg/dL   Protein Creatinine Ratio 0.30 (H) 0.00 - 0.15 mg/mg[Cre]       Assessment: 36 y.o. G2P0010 86w4dby 06/12/2021, by Last Menstrual early labor, SROM, preeclampsia without severe features   Plan: 1) The ARRIVE study was a national multicenter trial that randomized 6,106 patients to induction of labor at 39 weeks 0 days to 39 weeks 4 days (3,062) compared to expectant management (3,044).  There was no significant difference in adverse perinatal outcomes but there was a significantly lower rate of cesarean delivery, as well as lower rate of maternal hypertensive complications in the induction group.  Number to treat was calculated as 28 induction of labor to prevent 1 primary Cesarean section.   "Labor Induction versus Expectant Management in Nulliparous Low-Risk Women" [facility] iAugust 9, 2018 Vol. 379 No. 6   We discussed that expectant management to 41 weeks was also acceptable versus induction around 40 weeks if she would like to see if she goes into labor by her due date but prefers not to go over.  Patient elects to proceed with IOL when available with first date being 06/12/21.  Orders placed   2) Fetus - FHT 150   3) PNL - Blood type --/--/A POS (01/03  1621) / Anti-bodyscreen NEG (01/03 1621) / Rubella 1.64 (06/08 1127) / Varicella Immune / RPR Non Reactive (10/28 1003) / HBsAg Negative (06/08 1127) / HIV Non Reactive (10/28 1003) / 1-hr OGTT elevated with normal 3-hr / GBS Negative/-- (12/23 1630)   4) Preeclampsia without severe features - monitor BP intrapartum no treatables.  If severe range BP's develop magnesium sulfate.  Will need 1 week BP check  5) Immunization History -      Immunization History  Administered Date(s) Administered   Influenza-Unspecified 03/26/2018   MMR 11/05/2018   Td 04/24/2008   Tdap 05/23/2015, 04/15/2021      6) Disposition - pending delivery   AMalachy Mood MD, [facility] 06/06/2021, 1:19 PM

## 2021-06-09 NOTE — Op Note (Signed)
Preoperative Diagnosis: 1) 36 y.o. G2P0010 at [redacted]w[redacted]d with recurrent late deceleration 2) Failed forceps attempt  Postoperative Diagnosis: 1) 36 y.o. G2P0010 at [redacted]w[redacted]d with recurrent late deceleration 2) Failed forceps attempt  Operation Performed: Primary low transverse C-section via pfannenstiel skin incision  Indication: Patient with recurrent late decelerations which resolved when not pushing.  Given fetal heart rate decelerations discussed expediting delivery.  Patient has pushed to +2, agreed to trial of forceps.  Forceps applied but no movement of fetal vertex below the pubic symphysis noted and aborted.  Significant caput noted at delivery so likely component of CPD  Anesthesia: Epidural  Primary Surgeon: Malachy Mood, MD  Preoperative Antibiotics: 2g Ancef, 500 Azithromycin  Estimated Blood Loss: 600 mL  IV Fluids: 1000L  Drains or Tubes: Foley to gravity drainage, ON-Q catheter system  Implants: none  Specimens Removed: none  Complications: none  Intraoperative Findings:  Normal tubes ovaries and uterus.  Delivery resulted in the birth of a liveborn female, APGAR (1 MIN): 8   APGAR (5 MINS): 9, weight 6lbs 15oz  Patient Condition: stable  Procedure in Detail:  Patient was taken to the operating room were she was administered regional anesthesia.  She was positioned in the supine position, prepped and draped in the  Usual sterile fashion.  Prior to proceeding with the case a time out was performed and the level of anesthetic was checked and noted to be adequate.  Utilizing the scalpel a pfannenstiel skin incision was made 2cm above the pubic symphysis and carried down sharply to the the level of the rectus fascia.  The fascia was incised in the midline using the scalpel and then extended using mayo scissors.  The superior border of the rectus fascia was grasped with two Kocher clamps and the underlying rectus muscles were dissected of the fascia using blunt dissection.   The median raphae was incised using Mayo scissors.   The inferior border of the rectus fascia was dissected of the rectus muscles in a similar fashion.  The midline was identified, the peritoneum was entered bluntly and expanded using manual tractions.  The uterus was noted to be in a none rotated position.  Next the bladder blade was placed retracting the bladder caudally.  A bladder flap was not created.  A low transverse incision was scored on the lower uterine segment.  The hysterotomy was entered bluntly using the operators finger.  The hysterotomy incision was extended using manual traction.  The operators hand was placed within the hysterotomy position noting the fetus to be within the ROA position.  The vertex was grasped, flexed, a assistant provided a hand from below the incision, and the head was brought to the incision, and delivered a traumatically using fundal pressure.  The remainder of the body delivered with ease.  The infant was suctioned, cord was clamped and cut before handing off to the awaiting neonatologist.  The placenta was delivered using manual extraction.  The uterus was exteriorized, wiped clean of clots and debris using two moist laps.  The hysterotomy was closed using a two layer closure of 0 Vicryl, with the first being a running locked, the second a vertical imbricating.  The uterus was returned to the abdomen.  The peritoneal gutters were wiped clean of clots and debris using two moist laps.  The hysterotomy incision was re-inspected noted to be hemostatic.  The rectus muscles were inspected noted to be hemostatic.  The superior border of the rectus fascia was grasped with a Statistician  clamp.  The ON-Q trocars were then placed 4cm above the superior border of the incision and tunneled subfascially.  The introducers were removed and the catheters were threaded through the sleeves after which the sleeves were removed.  The fascia was closed using a looped #1 PDS in a running fashion  taking 1cm by 1cm bites.  The subcutaneous tissue was irrigated using warm saline, hemostasis achieved using the bovie.  The subcutaneous dead space was less than 3cm and was not closed.  The skin was closed using insorb stables.  Sponge needle and instrument counts were corrects times two.  The patient tolerated the procedure well and was taken to the recovery room in stable condition.

## 2021-06-09 NOTE — Discharge Summary (Signed)
OB Discharge Summary     Patient Name: Mary Kramer DOB: [birth year] MRN: [id number]  Date of admission: 06/09/2021 Delivering MD: Vena Austria   Date of discharge: 06/12/2021  Admitting diagnosis: Labor and delivery indication for care or intervention [O75.9] Normal labor [O80, Z37.9] Encounter for elective induction of labor [Z34.90] Intrauterine pregnancy: [redacted]w[redacted]d     Secondary diagnosis:  Normal labor Gestational hypertension  Additional problems: Gestational hypertension     Discharge diagnosis: Term Pregnancy Delivered                                                                                                Post partum procedures: none  Augmentation: Pitocin  Complications: None  Hospital course:  Onset of Labor With Unplanned C/S   36 y.o. yo G2P0010 at [redacted]w[redacted]d was admitted in Latent Labor on 06/09/2021. Patient had a labor course significant for SROM, progressed to 10cm after pitocin augmentation, developed recurrent late decelerations after having pushed for 2-hr with descent to +2 station but no further progress.  Trial of forceps unsuccessfully, and proceeded with LTCS. Delivery details as follows: Membrane Rupture Time/Date: 8:29 AM ,06/09/2021   Delivery Method:C-Section, Low Transverse  Patient also noted to have mild range BP on presentation, asymptomatic Details of operation can be found in separate operative note. Patient had an uncomplicated postpartum course.  She is ambulating,tolerating a regular diet, passing flatus, and urinating well.  Patient is discharged home in stable condition 06/12/21.  Newborn Data: Birth date:06/09/2021  Birth time:7:43 PM  Gender:Female  Living status:Living  Apgars:8 ,9  Weight:3160 g   Physical exam  Vitals:   06/11/21 0847 06/11/21 1523 06/11/21 2304 06/12/21 0905  BP: 107/72 108/73 115/69 124/87  Pulse: 71 72 70 72  Resp: 20 18 18 20   Temp: 98.6 F (37 C) 98.8 F (37.1 C) 99.2 F (37.3 C) 98.4 F (36.9  C)  TempSrc: Oral Oral Oral Oral  SpO2: 98%  100% 99%  Weight:      Height:       General: alert, cooperative, and no distress Lochia: appropriate Uterine Fundus: firm Incision: Healing well with no significant drainage DVT Evaluation: No evidence of DVT seen on physical exam. Labs: Lab Results  Component Value Date   WBC 22.7 (H) 06/10/2021   HGB 10.8 (L) 06/10/2021   HCT 31.4 (L) 06/10/2021   MCV 87.5 06/10/2021   PLT 246 06/10/2021   CMP Latest Ref Rng & Units 06/09/2021  Glucose 70 - 99 mg/dL 91  BUN 6 - 20 mg/dL 11  Creatinine 06/11/2021 - 6.60 mg/dL 6.30  Sodium 1.60 - 109 mmol/L 134(L)  Potassium 3.5 - 5.1 mmol/L 4.3  Chloride 98 - 111 mmol/L 103  CO2 22 - 32 mmol/L 24  Calcium 8.9 - 10.3 mg/dL 9.0  Total Protein 6.5 - 8.1 g/dL 7.2  Total Bilirubin 0.3 - 1.2 mg/dL 0.3  Alkaline Phos 38 - 126 U/L 128(H)  AST 15 - 41 U/L 24  ALT 0 - 44 U/L 26    Discharge instruction: per After Visit Summary and "Baby and Me Booklet".  After visit meds:  Allergies as of 06/12/2021       Reactions   Lexapro [escitalopram Oxalate] Nausea Only   Hydrocodone Rash   Vicodin [hydrocodone-acetaminophen] Rash        Medication List     TAKE these medications    ibuprofen 600 MG tablet Commonly known as: ADVIL Take 1 tablet (600 mg total) by mouth every 6 (six) hours.   ondansetron 4 MG disintegrating tablet Commonly known as: ZOFRAN-ODT Take 1 tablet (4 mg total) by mouth every 6 (six) hours as needed for nausea.   oxyCODONE-acetaminophen 5-325 MG tablet Commonly known as: PERCOCET/ROXICET Take 1 tablet by mouth every 4 (four) hours as needed for moderate pain or severe pain ((when tolerating fluids)).   prenatal multivitamin Tabs tablet Take 1 tablet by mouth daily at 12 noon.               Discharge Care Instructions  (From admission, onward)           Start     Ordered   06/12/21 0000  Discharge wound care:       Comments: You may apply a light dressing  for minor discharge from the incision or to keep waistbands of clothing from rubbing.  You may also have been discharge with a clear dressing in which case this will be removed at your postoperative clinic visit.  You may shower, use soap on your incision.  Avoid baths or soaking the incision in the first 6 weeks following your surgery.Marland Kitchen   06/12/21 0951            Diet: routine diet  Activity: Advance as tolerated. Pelvic rest for 6 weeks.   Outpatient follow up:1 week Follow up Appt: Future Appointments  Date Time Provider Department Center  06/17/2021  9:15 AM Nadara Mustard, MD [facility] None   Follow up Visit:No follow-ups on file.  Postpartum contraception: Nexplanon  Newborn Data: Live born female  Birth Weight: 6 lb 15.5 oz (3160 g) APGAR: 8, 9  Newborn Delivery   Birth date/time: 06/09/2021 19:43:00 Delivery type: C-Section, Low Transverse Trial of labor: Yes C-section categorization: Primary      Baby Feeding: Breast Disposition:home with mother   06/12/2021 Vena Austria, MD

## 2021-06-10 ENCOUNTER — Encounter: Payer: Self-pay | Admitting: Obstetrics and Gynecology

## 2021-06-10 LAB — CBC
HCT: 31.4 % — ABNORMAL LOW (ref 36.0–46.0)
Hemoglobin: 10.8 g/dL — ABNORMAL LOW (ref 12.0–15.0)
MCH: 30.1 pg (ref 26.0–34.0)
MCHC: 34.4 g/dL (ref 30.0–36.0)
MCV: 87.5 fL (ref 80.0–100.0)
Platelets: 246 10*3/uL (ref 150–400)
RBC: 3.59 MIL/uL — ABNORMAL LOW (ref 3.87–5.11)
RDW: 13.5 % (ref 11.5–15.5)
WBC: 22.7 10*3/uL — ABNORMAL HIGH (ref 4.0–10.5)
nRBC: 0 % (ref 0.0–0.2)

## 2021-06-10 LAB — RPR: RPR Ser Ql: NONREACTIVE

## 2021-06-10 MED ORDER — ACETAMINOPHEN 325 MG PO TABS
650.0000 mg | ORAL_TABLET | ORAL | Status: DC | PRN
  Administered 2021-06-10 (×3): 650 mg via ORAL
  Filled 2021-06-10 (×4): qty 2

## 2021-06-10 NOTE — Lactation Note (Signed)
This note was copied from a baby's chart. Lactation Consultation Note  Patient Name: Mary Kramer [id number] Date: 06/10/2021 Reason for consult: Follow-up assessment;Mother's request;Term Age:36 hours  Mom called lactation for support with attempted feeding. Baby last fed around 0915 but had spit-up afterwards; mom is wanting to attempt.  LC adjusted baby's position in football hold on L breast to have more space between chin and chest. Size 67mm NS was placed (previously given although nipples appear everted). Initially baby struggled with opening wide enough to accommodate the shield, but once he latched he had a rhythmic sucking pattern- however, mom notes discomfort/pinching sensation. LC adjusted shield size to size 3mm and relatched baby. Baby accepted the shield easier, but mom notes more discomfort. LC readjusted baby and pulled down on bottom lip/chin; baby breastfed this way on/off for about 10 minutes with audible swallows and more comfort for mom. LC was able to show mom the latch at the breast, the wide open mouth and flanged lips with LC pulling down on chin as the ideal latch; this will come with time, practice, and increased eagerness to eat.  LC recommended for baby to be skin to skin on mom's chest for a little while post feeding to help baby with digestion of feeding and hopefully help minimize the amount of emesis that baby is experiencing.  LC and mom discussed possible attempt to latch/feed without the nipple shield at the next feeding time; mom agrees.  Maternal Data Has patient been taught Hand Expression?: Yes Does the patient have breastfeeding experience prior to this delivery?: No  Feeding Mother's Current Feeding Choice: Breast Milk and Formula  LATCH Score Latch: Repeated attempts needed to sustain latch, nipple held in mouth throughout feeding, stimulation needed to elicit sucking reflex.  Audible Swallowing: Spontaneous and intermittent  Type of Nipple:  Everted at rest and after stimulation  Comfort (Breast/Nipple): Soft / non-tender  Hold (Positioning): Assistance needed to correctly position infant at breast and maintain latch.  LATCH Score: 8   Lactation Tools Discussed/Used Tools: Nipple Shields Nipple shield size: 20;24  Interventions Interventions: Assisted with latch;Skin to skin;Hand express;Adjust position;Support pillows;Education (nipple shield)  Discharge    Consult Status Consult Status: Follow-up Date: 06/10/21 Follow-up type: In-patient    Lavonia Drafts 06/10/2021, 12:51 PM

## 2021-06-10 NOTE — Lactation Note (Signed)
This note was copied from a baby's chart. Lactation Consultation Note  Patient Name: Mary Kramer [id number] Date: 06/10/2021 Reason for consult: Initial assessment;Primapara;Term;Other (Comment) (c-section) Age:36 hours  Initial lactation visit. Mom is P1, c-section 14 hours ago with feedings and output documented. Mom and support family awake and alert upon entry. Mom reports last feeding around 915 for about 25 minutes but then said that baby spit it up; it was clear with some yellow tint.  Mom expresses concern that baby isn't feeding well/getting enough and the feedings are "pinchy" with a nipple shield. LC provided reassurance for mom with reviewing normal newborn feeding behaviors and patterns, length of feedings, output for HOL, and tinged spit-up indicating colostrum transfer.  Encouraged to keep baby skin to skin with mom or dad as much as possible to help settle baby's tummy or make it easier for him to get the mucous out.  LC recommended mom to call with next feeding to help determine the cause of pinching sensation at the breast when feeding with the nipple shield. Mom verbalizes understanding.  Maternal Data Does the patient have breastfeeding experience prior to this delivery?: No  Feeding Mother's Current Feeding Choice: Breast Milk and Formula  LATCH Score                    Lactation Tools Discussed/Used Tools: Nipple Shields  Interventions Interventions: Breast feeding basics reviewed;Education  Discharge    Consult Status Consult Status: Follow-up Date: 06/10/21 Follow-up type: In-patient    Lavonia Drafts 06/10/2021, 10:09 AM

## 2021-06-10 NOTE — Anesthesia Postprocedure Evaluation (Signed)
Anesthesia Post Note  Patient: Mary Kramer  Procedure(s) Performed: [place]  Patient location during evaluation: Mother Baby Anesthesia Type: Epidural Level of consciousness: awake and alert Pain management: pain level controlled Vital Signs Assessment: post-procedure vital signs reviewed and stable Respiratory status: respiratory function stable Cardiovascular status: stable Postop Assessment: no headache, no backache, patient able to bend at knees, no apparent nausea or vomiting, able to ambulate and adequate PO intake Anesthetic complications: no   No notable events documented.   Last Vitals:  Vitals:   06/10/21 0700 06/10/21 0742  BP:  100/65  Pulse: 90 77  Resp:  20  Temp:  36.7 C  SpO2: 98%     Last Pain:  Vitals:   06/10/21 0800  TempSrc:   PainSc: 4                  Lanora Manis

## 2021-06-10 NOTE — Progress Notes (Signed)
Obstetric Postpartum/PostOperative Daily Progress Note Subjective:  36 y.o. G2P0010 post-operative day # 1 status post primary cesarean delivery.  She  is not yet  ambulating, is tolerating po,  is not yet  voiding spontaneously.  Her pain is well controlled on PO pain medications and On Q pump. Her lochia is less than menses. Baby is calmly latched at breast. Breastfeeding education reviewed and reassurance given.   Medications SCHEDULED MEDICATIONS   ketorolac  30 mg Intravenous Q6H   Followed by   [START ON 06/11/2021] ibuprofen  600 mg Oral Q6H   prenatal multivitamin  1 tablet Oral Q1200   senna-docusate  2 tablet Oral Q24H   simethicone  80 mg Oral TID PC    MEDICATION INFUSIONS   bupivacaine ON-Q pain pump     lactated ringers 125 mL/hr at 06/10/21 0930   oxytocin 2.5 Units/hr (06/09/21 2045)    PRN MEDICATIONS  acetaminophen, coconut oil, witch hazel-glycerin **AND** dibucaine, diphenhydrAMINE, menthol-cetylpyridinium, oxyCODONE-acetaminophen, simethicone    Objective:   Vitals:   06/10/21 0407 06/10/21 0500 06/10/21 0700 06/10/21 0742  BP: (!) 108/51   100/65  Pulse: (!) 101 88 90 77  Resp: 18   20  Temp: 98.5 F (36.9 C)   98.1 F (36.7 C)  TempSrc: Oral   Oral  SpO2: 99% 99% 98%   Weight:      Height:        Current Vital Signs 24h Vital Sign Ranges  T 98.1 F (36.7 C) Temp  Avg: 98.6 F (37 C)  Min: 97.6 F (36.4 C)  Max: 100.3 F (37.9 C)  BP 100/65 BP  Min: 100/65  Max: 142/85  HR 77 Pulse  Avg: 111.7  Min: 67  Max: 265  RR 20 Resp  Avg: 20.4  Min: 17  Max: 26  SaO2 98 % Room Air SpO2  Avg: 97.8 %  Min: 94 %  Max: 100 %       24 Hour I/O Current Shift I/O  Time Ins Outs 01/15 0701 - 01/16 0700 In: -  Out: 2350 [Urine:1550] 01/16 0701 - 01/16 1900 In: 1042.3 [I.V.:1042.3] Out: -    General: NAD Pulmonary: no increased work of breathing Abdomen: non-distended, non-tender, fundus firm at level of umbilicus Inc: Clean/dry/intact, On Q pump  intact Extremities: no edema, no erythema, no tenderness  Labs:  Recent Labs  Lab 06/09/21 0656 06/09/21 0923 06/10/21 0423  WBC 13.3* 13.8* 22.7*  HGB 14.9 14.5 10.8*  HCT 43.9 42.8 31.4*  PLT 315 313 246     Assessment:   35 y.o. G2P0010 postoperative day # 1 status post primary cesarean section, lactating  Plan:  1) Acute blood loss anemia - hemodynamically stable and asymptomatic - po ferrous sulfate  2) A POS / Rubella 1.64 (06/08 1127)/ Varicella Immune  3) TDAP status given antepartum  4) Breastfeeding; lactation   5) Contraception =  planning Nexplanon  6) Disposition: continue current care   Tresea Mall, CNM 06/10/2021 10:16 AM

## 2021-06-10 NOTE — Progress Notes (Signed)
700cc clear amber urine emptied from Foley Catheter. Catheter discontinued as per order. Pt. Tolerated well. Instructed Pt. To call for assist when she needs to get up out of bed to void and/or ambulate. Pt. V/O.

## 2021-06-11 NOTE — Lactation Note (Signed)
This note was copied from a baby's chart. Lactation Consultation Note  Patient Name: Boy Vercie Roadman [id number] Date: 06/11/2021 Reason for consult: Follow-up assessment;Primapara;Term;Other (Comment) (c-section) Age:36 hours  LC called into room by mom. Baby skin to skin on mom's chest, asleep. Mom reports that she has been told it was time to either wake baby to feed or offer supplement. However, baby did have a circumcision this morning which has been shown to hinder a baby's desire to eat. LC reinforced following baby's cues/clues, options of hand expression and spoon feeding if necessary to ease anxiety. Mom ok for now, felt reassured. RN updated.  Maternal Data Has patient been taught Hand Expression?: Yes Does the patient have breastfeeding experience prior to this delivery?: No  Feeding Mother's Current Feeding Choice: Breast Milk  LATCH Score Latch: Repeated attempts needed to sustain latch, nipple held in mouth throughout feeding, stimulation needed to elicit sucking reflex.  Audible Swallowing: A few with stimulation  Type of Nipple: Everted at rest and after stimulation  Comfort (Breast/Nipple): Soft / non-tender  Hold (Positioning): Assistance needed to correctly position infant at breast and maintain latch.  LATCH Score: 7   Lactation Tools Discussed/Used    Interventions Interventions: Breast feeding basics reviewed;Assisted with latch  Discharge Pump: Personal  Consult Status Consult Status: Follow-up Date: 06/11/21 Follow-up type: In-patient    Lavonia Drafts 06/11/2021, 12:39 PM

## 2021-06-11 NOTE — Lactation Note (Signed)
This note was copied from a baby's chart. Lactation Consultation Note  Patient Name: Mary Kramer [id number] Date: 06/11/2021 Reason for consult: Follow-up assessment;Primapara;Term;Other (Comment) (c-section) Age:36 hours  Lactation follow-up. Feedings continued overnight with voids/stools. Parents report a feeding to have gone well overnight with audible swallows and they are somewhat more confident that baby is receiving nutrition at the breast. Baby did receive a circumcision this morning. Mom attempting to feed baby at breast upon entry and states she is frustrated because baby won't eat. Baby is sound asleep in football hold at mom's breast/no interest shown. LC educated parents on normal behaviors post circumcision. Encouraged continued frequent attempts but not to be alarmed if baby doesn't feed well. Encouraged parents to get rest, and to feed with early cues.  Family planning to stay another day to work on and monitor feeding progress.  Maternal Data Has patient been taught Hand Expression?: Yes Does the patient have breastfeeding experience prior to this delivery?: No  Feeding Mother's Current Feeding Choice: Breast Milk  LATCH Score Latch: Too sleepy or reluctant, no latch achieved, no sucking elicited.  Audible Swallowing: None  Type of Nipple: Everted at rest and after stimulation  Comfort (Breast/Nipple): Soft / non-tender  Hold (Positioning): No assistance needed to correctly position infant at breast.  LATCH Score: 6   Lactation Tools Discussed/Used    Interventions Interventions: Breast feeding basics reviewed;Hand express;Education (post circumcision behaviors)  Discharge Pump: Personal  Consult Status Consult Status: Follow-up Date: 06/11/21 Follow-up type: In-patient    Danford Bad 06/11/2021, 10:26 AM

## 2021-06-11 NOTE — Progress Notes (Signed)
°  Subjective:  Doing well.  Ambulating and voiding without difficulty. Pain well managed with medications, desires script for percocet at discharge just in case. Breastfeeding is going well, has been able to latch infant independently.    Objective:  Blood pressure 107/72, pulse 71, temperature 98.6 F (37 C), temperature source Oral, resp. rate 20, height 5\' 1"  (1.549 m), weight 81.2 kg, last menstrual period 09/05/2020, SpO2 98 %.  General: NAD Breasts: soft, nipples erect and intact bilaterally  Lungs: CTAB Pulmonary: no increased work of breathing Abdomen: non-distended, non-tender, fundus firm at level of umbilicus Incision: dry and intact, pain pump intact  Extremities: no edema, no erythema, no tenderness  No results found for this or any previous visit (from the past 24 hour(s)). @I /O24@   Assessment:   36 y.o. G2P0010 postoperativeday # 2   Plan:  1) Acute blood loss anemia - hemodynamically stable and asymptomatic - po ferrous sulfate  2) --/--/A POS (01/15 31) / 1.64 (06/08 1127) / Varicella immune  3) TDAP status up to date   4) Breast/Bottle/Contraception: breast, Nexplanon   5) Disposition, discharge 1/18

## 2021-06-12 ENCOUNTER — Inpatient Hospital Stay: Admit: 2021-06-12 | Payer: Self-pay

## 2021-06-12 MED ORDER — OXYCODONE-ACETAMINOPHEN 5-325 MG PO TABS: 1.0000 | ORAL_TABLET | ORAL | 0 refills | Status: DC | PRN

## 2021-06-12 MED ORDER — IBUPROFEN 600 MG PO TABS: 600.0000 mg | ORAL_TABLET | Freq: Four times a day (QID) | ORAL | 0 refills | Status: DC

## 2021-06-12 NOTE — Progress Notes (Signed)
Pt discharged with infant. Discharge instructions, prescriptions, and follow up appointments given to and reviewed with patient. Pt verbalized understanding. To be escorted out by auxillary.  °

## 2021-06-13 ENCOUNTER — Ambulatory Visit: Payer: BC Managed Care – PPO | Admitting: Licensed Practical Nurse

## 2021-06-13 ENCOUNTER — Telehealth: Payer: Self-pay

## 2021-06-13 NOTE — Telephone Encounter (Signed)
VM Full unable to leave message.

## 2021-06-13 NOTE — Telephone Encounter (Signed)
Patient had c-section 06/09/2021. She has blood at the drain site. She is inquiring if she needs to take it out or if she needs to be seen. OI#[phone]

## 2021-06-13 NOTE — Telephone Encounter (Signed)
Spoke w/patient. She referred to the manual she received at the hospital. She has removed the bandage and the pump. Everything looks normal per illustrated pics in the manual per patient. She applied a new gauze/Tegaderm dressing.

## 2021-06-17 ENCOUNTER — Other Ambulatory Visit: Payer: Self-pay

## 2021-06-17 ENCOUNTER — Telehealth: Payer: Self-pay | Admitting: Obstetrics & Gynecology

## 2021-06-17 ENCOUNTER — Ambulatory Visit (INDEPENDENT_AMBULATORY_CARE_PROVIDER_SITE_OTHER): Payer: BC Managed Care – PPO | Admitting: Obstetrics & Gynecology

## 2021-06-17 ENCOUNTER — Encounter: Payer: Self-pay | Admitting: Obstetrics & Gynecology

## 2021-06-17 VITALS — BP 132/80 | Ht 61.0 in | Wt 160.0 lb

## 2021-06-17 DIAGNOSIS — Z98891 History of uterine scar from previous surgery: Secondary | ICD-10-CM

## 2021-06-17 DIAGNOSIS — R030 Elevated blood-pressure reading, without diagnosis of hypertension: Secondary | ICD-10-CM | POA: Diagnosis not present

## 2021-06-17 LAB — POCT URINALYSIS DIPSTICK
Bilirubin, UA: NEGATIVE
Blood, UA: NEGATIVE
Glucose, UA: NEGATIVE
Ketones, UA: NEGATIVE
Leukocytes, UA: NEGATIVE
Nitrite, UA: NEGATIVE
Protein, UA: NEGATIVE
Spec Grav, UA: 1.01 (ref 1.010–1.025)
Urobilinogen, UA: 0.2 E.U./dL
pH, UA: 5 (ref 5.0–8.0)

## 2021-06-17 MED ORDER — NORETHINDRONE 0.35 MG PO TABS: 1.0000 | ORAL_TABLET | Freq: Every day | ORAL | 11 refills | Status: DC

## 2021-06-17 NOTE — Progress Notes (Signed)
°  Postoperative Follow-up Patient presents post op from recent Cesarean Section performed for Arrest of Dilation, 1 week ago.   Subjective: Patient reports some improvement in her immediate post op symptoms. Eating a regular diet without difficulty. Pain is controlled with current analgesics. Medications being used: acetaminophen and ibuprofen (OTC).  Activity: sedentary. Patient reports additional symptom's since surgery of appropriate lochia, no signs of depression, and no signs of mastitis.  Objective: BP 132/80    Ht 5\' 1"  (1.549 m)    Wt 160 lb (72.6 kg)    BMI 30.23 kg/m  Physical Exam Constitutional:      General: She is not in acute distress.    Appearance: She is well-developed.  Cardiovascular:     Rate and Rhythm: Normal rate.  Pulmonary:     Effort: Pulmonary effort is normal.  Abdominal:     General: There is no distension.     Palpations: Abdomen is soft.     Tenderness: There is no abdominal tenderness.     Comments: Incision Healing Well   Musculoskeletal:        General: Normal range of motion.  Neurological:     Mental Status: She is alert and oriented to person, place, and time.     Cranial Nerves: No cranial nerve deficit.  Skin:    General: Skin is warm and dry.    Assessment: s/p : Cesarean Section for Arrest of Dilation stable  Plan: Patient has done well after her Cesarean Section with no apparent complications.  I have discussed the post-operative course to date, and the expected progress moving forward.  The patient understands what complications to be concerned about.  I will see the patient in routine follow up, or sooner if needed.    Activity plan: No heavy lifting.  Pelvic rest. She desires oral progesterone-only contraceptive or IUD for postpartum contraception.  Marland Kitchen 06/17/2021, 9:26 AM

## 2021-06-17 NOTE — Telephone Encounter (Signed)
Pt is scheduled with [facility] on Feb. 28 for Mirena placement.

## 2021-06-20 NOTE — Telephone Encounter (Signed)
Noted. Will order to arrive by apt date/time. 

## 2021-07-15 NOTE — Progress Notes (Signed)
Name: Mary Kramer   MRN: [id number]    DOB: 1985/06/25   Date:07/16/2021       Progress Note  Subjective  Chief Complaint  Medication Refill  HPI  ADHD: she was diagnosed as a child - in middle school. She took it off an on during high school, but resumed medication in her early twenties. She was on Adderal, was switched to Vyvanse but it caused her to feel sleepy and also moody when coming off medication. She was off Adderal during pregnancy, had c-section January 2023 and is going back to work April 2023. She would like to resume medication today, we will re-start at lower dose of 20 mg and go up to 25 mg if needed.   RAD: still has albuterol at home, she denies sob , wheezing or cough at this time, she uses medication prn only She usually has flares with cold symptoms no longer having seasonal allergies problems . Discussed PCV 20 but she would like to hold off for now also discussed importance of yearly flu vaccines.   Migraine headaches: she states episodes happening a couple of times a month, usually triggered by strong scents, lack of sleep or barometric pressure changes, she has aura described as yawning but has not tried maxalt yet because Motrin control pain within  4-5 hours. She has associated nausea, mild photophobia but she keeps working. Pain is described as sharp or dull, on nuchal area, temporal or frontal area. She was diagnosed by a neurologist, she never took a triptan.  She states she has two family members that had brain aneurysm - grandfather and one aunt at age 62. No longer having problems, states symptoms resolved during pregnancy and no episodes since she delivered in January 2023     Patient Active Problem List   Diagnosis Date Noted   S/P cesarean section 06/17/2021   Fetal intolerance to labor, delivered, current hospitalization    Delivery by emergency cesarean    History of loop electrosurgical excision procedure (LEEP) of cervix affecting pregnancy,  antepartum 12/06/2020   Carrier of genetic disorder 10/31/2020   Asthma, stable, mild intermittent 01/07/2019   Controlled substance agreement signed 04/28/2017   Insomnia 08/10/2015   Vitamin D deficiency 08/10/2015   Migraine without aura 05/11/2015   ADHD (attention deficit hyperactivity disorder) 02/09/2015   Hypoglycemia 02/09/2015   Lump or mass in breast 11/15/2012    Past Surgical History:  Procedure Laterality Date   CERVICAL BIOPSY  W/ LOOP ELECTRODE EXCISION     CESAREAN SECTION  06/09/2021   Procedure: CESAREAN SECTION;  Surgeon: Vena Austria, MD;  Location: [facility] ORS;  Service: Obstetrics;;   DG HYSTEROGRAM (HSG)  07/30/2017        Family History  Problem Relation Age of Onset   Anxiety disorder Mother    Migraines Mother    Hyperlipidemia Father    Transient ischemic attack Father    Hypertension Father    Stroke Father    Lung disease Father    Atrial fibrillation Maternal Grandmother    Cancer Paternal Grandmother        leukemia   Diabetes Paternal Grandfather    Cerebral aneurysm Paternal Aunt    Cerebral aneurysm Maternal Grandfather     Social History   Tobacco Use   Smoking status: Never   Smokeless tobacco: Never  Substance Use Topics   Alcohol use: Not Currently    Comment: rarely     Current Outpatient Medications:    ibuprofen (  ADVIL) 600 MG tablet, Take 1 tablet (600 mg total) by mouth every 6 (six) hours., Disp: 30 tablet, Rfl: 0   norethindrone (MICRONOR) 0.35 MG tablet, Take 1 tablet (0.35 mg total) by mouth daily., Disp: 28 tablet, Rfl: 11   Prenatal Vit-Fe Fumarate-FA (PRENATAL MULTIVITAMIN) TABS tablet, Take 1 tablet by mouth daily at 12 noon. (Patient not taking: Reported on 06/17/2021), Disp: , Rfl:   Allergies  Allergen Reactions   Lexapro [Escitalopram Oxalate] Nausea Only   Hydrocodone Rash   Vicodin [Hydrocodone-Acetaminophen] Rash    I personally reviewed active problem list, medication list, allergies, family  history, social history, health maintenance with the patient/caregiver today.   ROS  Constitutional: Negative for fever or weight change.  Respiratory: Negative for cough and shortness of breath.   Cardiovascular: Negative for chest pain or palpitations.  Gastrointestinal: Negative for abdominal pain, no bowel changes.  Musculoskeletal: Negative for gait problem or joint swelling.  Skin: Negative for rash.  Neurological: Negative for dizziness or headache.  No other specific complaints in a complete review of systems (except as listed in HPI above).   Objective  Vitals:   07/16/21 0952  BP: 128/70  Pulse: 90  Resp: 16  SpO2: 98%  Weight: 156 lb (70.8 kg)  Height: 5\' 1"  (1.549 m)    Body mass index is 29.48 kg/m.  Physical Exam  Constitutional: Patient appears well-developed and well-nourished.  No distress.  HEENT: head atraumatic, normocephalic, pupils equal and reactive to light, neck supple Cardiovascular: Normal rate, regular rhythm and normal heart sounds.  No murmur heard. No BLE edema. Pulmonary/Chest: Effort normal and breath sounds normal. No respiratory distress. Abdominal: Soft.  There is no tenderness. Psychiatric: Patient has a normal mood and affect. behavior is normal. Judgment and thought content normal.   Recent Results (from the past 2160 hour(s))  POC Urinalysis Dipstick OB     Status: Normal   Collection Time: 05/13/21  4:52 PM  Result Value Ref Range   Color, UA     Clarity, UA     Glucose, UA Negative Negative   Bilirubin, UA     Ketones, UA     Spec Grav, UA     Blood, UA     pH, UA     POC,PROTEIN,UA Negative Negative, Trace, Small (1+), Moderate (2+), Large (3+), 4+   Urobilinogen, UA     Nitrite, UA     Leukocytes, UA     Appearance     Odor    Culture, beta strep (group b only)     Status: None   Collection Time: 05/17/21  4:30 PM   Specimen: Vaginal/Rectal; Genital   VR  Result Value Ref Range   Strep Gp B Culture Negative  Negative    Comment: Centers for Disease Control and Prevention (CDC) and American Congress of Obstetricians and Gynecologists (ACOG) guidelines for prevention of perinatal group B streptococcal (GBS) disease specify co-collection of a vaginal and rectal swab specimen to maximize sensitivity of GBS detection. Per the CDC and ACOG, swabbing both the lower vagina and rectum substantially increases the yield of detection compared with sampling the vagina alone. Penicillin G, ampicillin, or cefazolin are indicated for intrapartum prophylaxis of perinatal GBS colonization. Reflex susceptibility testing should be performed prior to use of clindamycin only on GBS isolates from penicillin-allergic women who are considered a high risk for anaphylaxis. Treatment with vancomycin without additional testing is warranted if resistance to clindamycin is noted.   Resp Panel by  RT-PCR (Flu A&B, Covid) Nasopharyngeal Swab     Status: None   Collection Time: 05/28/21  2:44 PM   Specimen: Nasopharyngeal Swab; Nasopharyngeal(NP) swabs in vial transport medium  Result Value Ref Range   SARS Coronavirus 2 by RT PCR NEGATIVE NEGATIVE    Comment: (NOTE) SARS-CoV-2 target nucleic acids are NOT DETECTED.  The SARS-CoV-2 RNA is generally detectable in upper respiratory specimens during the acute phase of infection. The lowest concentration of SARS-CoV-2 viral copies this assay can detect is 138 copies/mL. A negative result does not preclude SARS-Cov-2 infection and should not be used as the sole basis for treatment or other patient management decisions. A negative result may occur with  improper specimen collection/handling, submission of specimen other than nasopharyngeal swab, presence of viral mutation(s) within the areas targeted by this assay, and inadequate number of viral copies(<138 copies/mL). A negative result must be combined with clinical observations, patient history, and  epidemiological information. The expected result is Negative.  Fact Sheet for Patients:  BloggerCourse.com  Fact Sheet for Healthcare Providers:  SeriousBroker.it  This test is no t yet approved or cleared by the [place] FDA and  has been authorized for detection and/or diagnosis of SARS-CoV-2 by FDA under an Emergency Use Authorization (EUA). This EUA will remain  in effect (meaning this test can be used) for the duration of the COVID-19 declaration under Section 564(b)(1) of the Act, 21 U.S.C.section 360bbb-3(b)(1), unless the authorization is terminated  or revoked sooner.       Influenza A by PCR NEGATIVE NEGATIVE   Influenza B by PCR NEGATIVE NEGATIVE    Comment: (NOTE) The Xpert Xpress SARS-CoV-2/FLU/RSV plus assay is intended as an aid in the diagnosis of influenza from Nasopharyngeal swab specimens and should not be used as a sole basis for treatment. Nasal washings and aspirates are unacceptable for Xpert Xpress SARS-CoV-2/FLU/RSV testing.  Fact Sheet for Patients: BloggerCourse.com  Fact Sheet for Healthcare Providers: SeriousBroker.it  This test is not yet approved or cleared by the [place] FDA and has been authorized for detection and/or diagnosis of SARS-CoV-2 by FDA under an Emergency Use Authorization (EUA). This EUA will remain in effect (meaning this test can be used) for the duration of the COVID-19 declaration under Section 564(b)(1) of the Act, 21 U.S.C. section 360bbb-3(b)(1), unless the authorization is terminated or revoked.  Performed at [facility], [address] [place], [place] [address]   CBC     Status: Abnormal   Collection Time: 05/28/21  4:21 PM  Result Value Ref Range   WBC 12.2 (H) 4.0 - 10.5 K/uL   RBC 4.82 3.87 - 5.11 MIL/uL   Hemoglobin 14.3 12.0 - 15.0 g/dL   HCT 60.4 54.0 - 98.1 %   MCV 88.6 80.0 - 100.0  fL   MCH 29.7 26.0 - 34.0 pg   MCHC 33.5 30.0 - 36.0 g/dL   RDW 19.1 47.8 - 29.5 %   Platelets 290 150 - 400 K/uL   nRBC 0.0 0.0 - 0.2 %    Comment: Performed at [facility], [address] [place], [place] [address]  Type and screen [facility]     Status: None   Collection Time: 05/28/21  4:21 PM  Result Value Ref Range   ABO/RH(D) A POS    Antibody Screen NEG    Sample Expiration      05/31/2021,2359 Performed at [facility], [address], [place], [place] [address]   CBC  Status: Abnormal   Collection Time: 06/09/21  6:56 AM  Result Value Ref Range   WBC 13.3 (H) 4.0 - 10.5 K/uL   RBC 5.04 3.87 - 5.11 MIL/uL   Hemoglobin 14.9 12.0 - 15.0 g/dL   HCT 16.1 09.6 - 04.5 %   MCV 87.1 80.0 - 100.0 fL   MCH 29.6 26.0 - 34.0 pg   MCHC 33.9 30.0 - 36.0 g/dL   RDW 40.9 81.1 - 91.4 %   Platelets 315 150 - 400 K/uL   nRBC 0.0 0.0 - 0.2 %    Comment: Performed at [facility], [address], [place], [place] [address]  Comprehensive metabolic panel     Status: Abnormal   Collection Time: 06/09/21  6:56 AM  Result Value Ref Range   Sodium 134 (L) 135 - 145 mmol/L   Potassium 4.3 3.5 - 5.1 mmol/L   Chloride 103 98 - 111 mmol/L   CO2 24 22 - 32 mmol/L   Glucose, Bld 91 70 - 99 mg/dL    Comment: Glucose reference range applies only to samples taken after fasting for at least 8 hours.   BUN 11 6 - 20 mg/dL   Creatinine, Ser 6.21 0.44 - 1.00 mg/dL   Calcium 9.0 8.9 - 30.8 mg/dL   Total Protein 7.2 6.5 - 8.1 g/dL   Albumin 3.4 (L) 3.5 - 5.0 g/dL   AST 24 15 - 41 U/L   ALT 26 0 - 44 U/L   Alkaline Phosphatase 128 (H) 38 - 126 U/L   Total Bilirubin 0.3 0.3 - 1.2 mg/dL   GFR, Estimated >65 >78 mL/min    Comment: (NOTE) Calculated using the CKD-EPI Creatinine Equation (2021)    Anion gap 7 5 - 15    Comment: Performed at [facility], [address] [place], [place] [address]  Protein / creatinine ratio, urine      Status: Abnormal   Collection Time: 06/09/21  7:28 AM  Result Value Ref Range   Creatinine, Urine 105 mg/dL   Total Protein, Urine 32 mg/dL    Comment: NO NORMAL RANGE ESTABLISHED FOR THIS TEST   Protein Creatinine Ratio 0.30 (H) 0.00 - 0.15 mg/mg[Cre]    Comment: Performed at [facility], [address] [place], [place] [address]  Type and screen [facility]     Status: None   Collection Time: 06/09/21  9:23 AM  Result Value Ref Range   ABO/RH(D) A POS    Antibody Screen NEG    Sample Expiration      06/12/2021,2359 Performed at [facility], [address] [place], [place] [address]   RPR     Status: None   Collection Time: 06/09/21  9:23 AM  Result Value Ref Range   RPR Ser Ql NON REACTIVE NON REACTIVE    Comment: Performed at [facility], [address] [place], [place] [address]  CBC     Status: Abnormal   Collection Time: 06/09/21  9:23 AM  Result Value Ref Range   WBC 13.8 (H) 4.0 - 10.5 K/uL   RBC 4.89 3.87 - 5.11 MIL/uL   Hemoglobin 14.5 12.0 - 15.0 g/dL   HCT 25.3 66.4 - 40.3 %   MCV 87.5 80.0 - 100.0 fL   MCH 29.7 26.0 - 34.0 pg   MCHC 33.9 30.0 - 36.0 g/dL   RDW 47.4 25.9 - 56.3 %   Platelets 313 150 - 400 K/uL  nRBC 0.0 0.0 - 0.2 %    Comment: Performed at [facility], [address] [place], [place] [address]  Resp Panel by RT-PCR (Flu A&B, Covid) Nasopharyngeal Swab     Status: None   Collection Time: 06/09/21  9:24 AM   Specimen: Nasopharyngeal Swab; Nasopharyngeal(NP) swabs in vial transport medium  Result Value Ref Range   SARS Coronavirus 2 by RT PCR NEGATIVE NEGATIVE    Comment: (NOTE) SARS-CoV-2 target nucleic acids are NOT DETECTED.  The SARS-CoV-2 RNA is generally detectable in upper respiratory specimens during the acute phase of infection. The lowest concentration of SARS-CoV-2 viral copies this assay can detect is 138 copies/mL. A negative result does not preclude  SARS-Cov-2 infection and should not be used as the sole basis for treatment or other patient management decisions. A negative result may occur with  improper specimen collection/handling, submission of specimen other than nasopharyngeal swab, presence of viral mutation(s) within the areas targeted by this assay, and inadequate number of viral copies(<138 copies/mL). A negative result must be combined with clinical observations, patient history, and epidemiological information. The expected result is Negative.  Fact Sheet for Patients:  BloggerCourse.com  Fact Sheet for Healthcare Providers:  SeriousBroker.it  This test is no t yet approved or cleared by the [place] FDA and  has been authorized for detection and/or diagnosis of SARS-CoV-2 by FDA under an Emergency Use Authorization (EUA). This EUA will remain  in effect (meaning this test can be used) for the duration of the COVID-19 declaration under Section 564(b)(1) of the Act, 21 U.S.C.section 360bbb-3(b)(1), unless the authorization is terminated  or revoked sooner.       Influenza A by PCR NEGATIVE NEGATIVE   Influenza B by PCR NEGATIVE NEGATIVE    Comment: (NOTE) The Xpert Xpress SARS-CoV-2/FLU/RSV plus assay is intended as an aid in the diagnosis of influenza from Nasopharyngeal swab specimens and should not be used as a sole basis for treatment. Nasal washings and aspirates are unacceptable for Xpert Xpress SARS-CoV-2/FLU/RSV testing.  Fact Sheet for Patients: BloggerCourse.com  Fact Sheet for Healthcare Providers: SeriousBroker.it  This test is not yet approved or cleared by the [place] FDA and has been authorized for detection and/or diagnosis of SARS-CoV-2 by FDA under an Emergency Use Authorization (EUA). This EUA will remain in effect (meaning this test can be used) for the duration of the COVID-19  declaration under Section 564(b)(1) of the Act, 21 U.S.C. section 360bbb-3(b)(1), unless the authorization is terminated or revoked.  Performed at [facility], [address] [place], [place] [address]   CBC     Status: Abnormal   Collection Time: 06/10/21  4:23 AM  Result Value Ref Range   WBC 22.7 (H) 4.0 - 10.5 K/uL   RBC 3.59 (L) 3.87 - 5.11 MIL/uL   Hemoglobin 10.8 (L) 12.0 - 15.0 g/dL    Comment: REPEATED TO VERIFY   HCT 31.4 (L) 36.0 - 46.0 %   MCV 87.5 80.0 - 100.0 fL   MCH 30.1 26.0 - 34.0 pg   MCHC 34.4 30.0 - 36.0 g/dL   RDW 75.1 02.5 - 85.2 %   Platelets 246 150 - 400 K/uL   nRBC 0.0 0.0 - 0.2 %    Comment: Performed at [facility], [address] [place], [place] [address]  POCT urinalysis dipstick     Status: Normal   Collection Time: 06/17/21  9:29 AM  Result Value Ref Range   Color, UA  Clarity, UA     Glucose, UA Negative Negative   Bilirubin, UA Negative    Ketones, UA Negative    Spec Grav, UA 1.010 1.010 - 1.025   Blood, UA Negative    pH, UA 5.0 5.0 - 8.0   Protein, UA Negative Negative   Urobilinogen, UA 0.2 0.2 or 1.0 E.U./dL   Nitrite, UA Negative    Leukocytes, UA Negative Negative   Appearance     Odor      PHQ2/9: Depression screen [facility] 07/16/2021 09/04/2020 06/06/2020 12/27/2019 09/06/2019  Decreased Interest 0 0 0 0 0  Down, Depressed, Hopeless 0 0 0 0 0  PHQ - 2 Score 0 0 0 0 0  Altered sleeping 0 - 0 0 0  Tired, decreased energy 0 - 0 0 0  Change in appetite 0 - 0 0 0  Feeling bad or failure about yourself  0 - 0 0 0  Trouble concentrating 0 - 0 0 0  Moving slowly or fidgety/restless 0 - 0 0 0  Suicidal thoughts 0 - 0 0 0  PHQ-9 Score 0 - 0 0 0  Difficult doing work/chores - - Not difficult at all - -    phq 9 is negative   Fall Risk: Fall Risk  07/16/2021 09/04/2020 06/06/2020 12/27/2019 09/06/2019  Falls in the past year? 0 0 0 0 0  Number falls in past yr: 0 0 0 0 0  Injury with Fall? 0 0 0 0 0  Risk  for fall due to : No Fall Risks - - - -  Follow up Falls prevention discussed - - - -      Functional Status Survey: Is the patient deaf or have difficulty hearing?: No Does the patient have difficulty seeing, even when wearing glasses/contacts?: No Does the patient have difficulty concentrating, remembering, or making decisions?: No Does the patient have difficulty walking or climbing stairs?: No Does the patient have difficulty dressing or bathing?: No Does the patient have difficulty doing errands alone such as visiting a doctor's office or shopping?: No    Assessment & Plan  1. Attention deficit hyperactivity disorder (ADHD), unspecified ADHD type   2. Reactive airway disease with wheezing without complication, unspecified asthma severity, unspecified whether persistent  Needs albuterol   3. Migraine without aura and without status migrainosus, not intractable  No problems , she still has nurtec at home   4. Vitamin D deficiency   5. Frequent headaches   6. Need for pneumococcal vaccine  Refused   7. Needs flu shot  refused  8. Encounter for initial prescription of other contraceptives  - Levonorgestrel-Ethinyl Estradiol (AMETHIA) 0.15-0.03 &0.01 MG tablet; Take 1 tablet by mouth daily.  Dispense: 91 tablet; Refill: 4

## 2021-07-16 ENCOUNTER — Ambulatory Visit: Payer: BC Managed Care – PPO | Admitting: Family Medicine

## 2021-07-16 ENCOUNTER — Encounter: Payer: Self-pay | Admitting: Family Medicine

## 2021-07-16 ENCOUNTER — Other Ambulatory Visit: Payer: Self-pay

## 2021-07-16 VITALS — BP 128/70 | HR 90 | Resp 16 | Ht 61.0 in | Wt 156.0 lb

## 2021-07-16 DIAGNOSIS — R519 Headache, unspecified: Secondary | ICD-10-CM

## 2021-07-16 DIAGNOSIS — E559 Vitamin D deficiency, unspecified: Secondary | ICD-10-CM | POA: Diagnosis not present

## 2021-07-16 DIAGNOSIS — G43009 Migraine without aura, not intractable, without status migrainosus: Secondary | ICD-10-CM | POA: Diagnosis not present

## 2021-07-16 DIAGNOSIS — Z30018 Encounter for initial prescription of other contraceptives: Secondary | ICD-10-CM

## 2021-07-16 DIAGNOSIS — F909 Attention-deficit hyperactivity disorder, unspecified type: Secondary | ICD-10-CM

## 2021-07-16 DIAGNOSIS — Z23 Encounter for immunization: Secondary | ICD-10-CM

## 2021-07-16 DIAGNOSIS — J45909 Unspecified asthma, uncomplicated: Secondary | ICD-10-CM

## 2021-07-16 MED ORDER — ALBUTEROL SULFATE HFA 108 (90 BASE) MCG/ACT IN AERS: 2.0000 | INHALATION_SPRAY | Freq: Four times a day (QID) | RESPIRATORY_TRACT | 0 refills | Status: DC | PRN

## 2021-07-16 MED ORDER — AMPHETAMINE-DEXTROAMPHETAMINE 10 MG PO TABS
10.0000 mg | ORAL_TABLET | Freq: Two times a day (BID) | ORAL | 0 refills | Status: DC
  Filled 2021-07-16: qty 60, 30d supply, fill #0

## 2021-07-16 MED ORDER — AMPHETAMINE-DEXTROAMPHET ER 20 MG PO CP24
20.0000 mg | ORAL_CAPSULE | ORAL | 0 refills | Status: DC
  Filled 2021-07-16: qty 90, 90d supply, fill #0

## 2021-07-16 MED ORDER — NURTEC 75 MG PO TBDP: 1.0000 | ORAL_TABLET | Freq: Every day | ORAL | 0 refills | Status: DC | PRN

## 2021-07-16 MED ORDER — LEVONORGEST-ETH ESTRAD 91-DAY 0.15-0.03 &0.01 MG PO TABS: 1.0000 | ORAL_TABLET | Freq: Every day | ORAL | 4 refills | Status: DC

## 2021-07-23 ENCOUNTER — Encounter: Payer: Self-pay | Admitting: Obstetrics & Gynecology

## 2021-07-23 ENCOUNTER — Other Ambulatory Visit: Payer: Self-pay

## 2021-07-23 ENCOUNTER — Ambulatory Visit (INDEPENDENT_AMBULATORY_CARE_PROVIDER_SITE_OTHER): Payer: BC Managed Care – PPO | Admitting: Obstetrics & Gynecology

## 2021-07-23 NOTE — Progress Notes (Signed)
°  OBSTETRICS POSTPARTUM CLINIC PROGRESS NOTE  Subjective:     Mary Kramer is a 36 y.o. [id number] female who presents for a postpartum visit. She is 6 weeks postpartum following a Term pregnancy or Single fetus and delivery by C-section.  I have fully reviewed the prenatal and intrapartum course. Anesthesia: epidural.  Postpartum course has been complicated by uncomplicated.  Baby is feeding by Bottle.  Bleeding: patient has not  resumed menses.  Bowel function is normal. Bladder function is normal.  Patient is sexually active. Contraception method desired is OCP (estrogen/progesterone).  Postpartum depression screening: negative. Edinburgh 0.  The following portions of the patient's history were reviewed and updated as appropriate: allergies, current medications, past family history, past medical history, past social history, past surgical history, and problem list.  Review of Systems Pertinent items are noted in HPI.  Objective:    BP 118/80    Ht 5\' 1"  (1.549 m)    Wt 158 lb (71.7 kg)    Breastfeeding No    BMI 29.85 kg/m   General:  alert and no distress   Breasts:  inspection negative, no nipple discharge or bleeding, no masses or nodularity palpable  Lungs: clear to auscultation bilaterally  Heart:  regular rate and rhythm, S1, S2 normal, no murmur, click, rub or gallop  Abdomen: soft, non-tender; bowel sounds normal; no masses,  no organomegaly.  Well healed Pfannenstiel incision   Vulva:  normal  Vagina: normal vagina, no discharge, exudate, lesion, or erythema  Cervix:  no cervical motion tenderness and no lesions  Corpus: normal size, contour, position, consistency, mobility, non-tender  Adnexa:  normal adnexa and no mass, fullness, tenderness  Rectal Exam: Not performed.          Assessment:  Post Partum Care visit 1. Postpartum examination following cesarean delivery Plans OCPs   Plan:  See orders and Patient Instructions Resume all normal  activities Follow up in:  12  months for PAP/Annual or as needed.   , MD, Annamarie Major Ob/Gyn, [facility] 07/23/2021  1:40 PM

## 2021-08-19 NOTE — Telephone Encounter (Signed)
Patient decided OCP's per 07/23/21 visit note. ?

## 2021-10-14 NOTE — Progress Notes (Unsigned)
Name: Barkley BoardsStacey White Kramer   MRN: [id number]    DOB: 05-22-1986   Date:10/15/2021       Progress Note  Subjective  Chief Complaint  Follow Up  HPI  ADHD: she was diagnosed as a child - in middle school. She took it off an on during high school, but resumed medication in her early twenties. She was on Adderal, was switched to Vyvanse but it caused her to feel sleepy and also moody when coming off medication. She was off Adderal during pregnancy, had c-section January 2023 and is went back to work on April 2023, she states transition back to work has been smooth.   Contraception: she started taking OCP - started after post-partum, she has been spotting daily since ocp's, she wants to only have a cycle every 3 months, she decided to try one more round and if does not work we will change her to 28 day packs like Junel   RAD: still has albuterol at home, she denies sob , wheezing or cough at this time, she uses medication prn only She usually has flares with cold symptoms no longer having seasonal allergies problems . Discussed PCV 20 again - she states wants to hold off until next visit   Migraine headaches: she states episodes happening a couple of times a month, usually triggered by strong scents, lack of sleep or barometric pressure changes, she has aura described as yawning but has not tried maxalt yet because Motrin control pain within  4-5 hours. She has associated nausea, mild photophobia but she keeps working. Pain is described as sharp or dull, on nuchal area, temporal or frontal area. She was diagnosed by a neurologist, she never took a triptan.  She states she has two family members that had brain aneurysm - grandfather and one aunt at age 36. No longer having problems, states symptoms resolved during pregnancy , baby was delivered in 05/2021 and still no episodes. She has a tension headache this morning because she did not drink coffee in 2 days.   Patient Active Problem List   Diagnosis  Date Noted   S/P cesarean section 06/17/2021   Fetal intolerance to labor, delivered, current hospitalization    Delivery by emergency cesarean    History of loop electrosurgical excision procedure (LEEP) of cervix affecting pregnancy, antepartum 12/06/2020   Carrier of genetic disorder 10/31/2020   Asthma, stable, mild intermittent 01/07/2019   Controlled substance agreement signed 04/28/2017   Insomnia 08/10/2015   Vitamin D deficiency 08/10/2015   Migraine without aura 05/11/2015   ADHD (attention deficit hyperactivity disorder) 02/09/2015   Hypoglycemia 02/09/2015   Lump or mass in breast 11/15/2012    Past Surgical History:  Procedure Laterality Date   CERVICAL BIOPSY  W/ LOOP ELECTRODE EXCISION     CESAREAN SECTION  06/09/2021   Procedure: CESAREAN SECTION;  Surgeon: Vena AustriaStaebler, Andreas, MD;  Location: [facility] ORS;  Service: Obstetrics;;   DG HYSTEROGRAM (HSG)  07/30/2017        Family History  Problem Relation Age of Onset   Anxiety disorder Mother    Migraines Mother    Hyperlipidemia Father    Transient ischemic attack Father    Hypertension Father    Stroke Father    Lung disease Father    Atrial fibrillation Maternal Grandmother    Cancer Paternal Grandmother        leukemia   Diabetes Paternal Grandfather    Cerebral aneurysm Paternal Aunt    Cerebral aneurysm Maternal Grandfather  Social History   Tobacco Use   Smoking status: Never   Smokeless tobacco: Never  Substance Use Topics   Alcohol use: Not Currently    Comment: rarely     Current Outpatient Medications:    amphetamine-dextroamphetamine (ADDERALL XR) 20 MG 24 hr capsule, Take 1 capsule (20 mg total) by mouth every morning., Disp: 90 capsule, Rfl: 0   Levonorgestrel-Ethinyl Estradiol (AMETHIA) 0.15-0.03 &0.01 MG tablet, Take 1 tablet by mouth daily., Disp: 91 tablet, Rfl: 4  Allergies  Allergen Reactions   Lexapro [Escitalopram Oxalate] Nausea Only   Hydrocodone Rash   Vicodin  [Hydrocodone-Acetaminophen] Rash    I personally reviewed active problem list, medication list, allergies, family history, social history, health maintenance with the patient/caregiver today.   ROS  Constitutional: Negative for fever or weight change.  Respiratory: Negative for cough and shortness of breath.   Cardiovascular: Negative for chest pain or palpitations.  Gastrointestinal: Negative for abdominal pain, no bowel changes.  Musculoskeletal: Negative for gait problem or joint swelling.  Skin: Negative for rash.  Neurological: Negative for dizziness or headache.  No other specific complaints in a complete review of systems (except as listed in HPI above).   Objective  Vitals:   10/15/21 0841  BP: 120/68  Pulse: 80  Resp: 16  SpO2: 98%  Weight: 143 lb (64.9 kg)  Height: 5\' 1"  (1.549 m)    Body mass index is 27.02 kg/m.  Physical Exam  Constitutional: Patient appears well-developed and well-nourished.  No distress.  HEENT: head atraumatic, normocephalic, pupils equal and reactive to light, neck supple Cardiovascular: Normal rate, regular rhythm and normal heart sounds.  No murmur heard. No BLE edema. Pulmonary/Chest: Effort normal and breath sounds normal. No respiratory distress. Abdominal: Soft.  There is no tenderness. Psychiatric: Patient has a normal mood and affect. behavior is normal. Judgment and thought content normal.   PHQ2/9:    10/15/2021    8:40 AM 07/16/2021    9:52 AM 09/04/2020    3:10 PM 06/06/2020    9:35 AM 12/27/2019    8:50 AM  Depression screen PHQ 2/9  Decreased Interest 0 0 0 0 0  Down, Depressed, Hopeless 0 0 0 0 0  PHQ - 2 Score 0 0 0 0 0  Altered sleeping 0 0  0 0  Tired, decreased energy 0 0  0 0  Change in appetite 0 0  0 0  Feeling bad or failure about yourself  0 0  0 0  Trouble concentrating 0 0  0 0  Moving slowly or fidgety/restless 0 0  0 0  Suicidal thoughts 0 0  0 0  PHQ-9 Score 0 0  0 0  Difficult doing work/chores     Not difficult at all     phq 9 is negative   Fall Risk:    10/15/2021    8:40 AM 07/16/2021    9:52 AM 09/04/2020    3:05 PM 06/06/2020    9:35 AM 12/27/2019    8:50 AM  Fall Risk   Falls in the past year? 0 0 0 0 0  Number falls in past yr: 0 0 0 0 0  Injury with Fall? 0 0 0 0 0  Risk for fall due to : No Fall Risks No Fall Risks     Follow up Falls prevention discussed Falls prevention discussed         Functional Status Survey: Is the patient deaf or have difficulty hearing?: No Does  the patient have difficulty seeing, even when wearing glasses/contacts?: No Does the patient have difficulty concentrating, remembering, or making decisions?: No Does the patient have difficulty walking or climbing stairs?: No Does the patient have difficulty dressing or bathing?: No Does the patient have difficulty doing errands alone such as visiting a doctor's office or shopping?: No    Assessment & Plan  Problem List Items Addressed This Visit     ADHD (attention deficit hyperactivity disorder) (Chronic)    Back to work, had baby in January , she would like to resume her pre-pregnancy dose  Adderal 25 mg       Relevant Medications   amphetamine-dextroamphetamine (ADDERALL XR) 25 MG 24 hr capsule   Migraine without aura (Chronic)    Doing well, no symptoms        Asthma, stable, mild intermittent    Reminded her to PCV 20

## 2021-10-15 ENCOUNTER — Ambulatory Visit (INDEPENDENT_AMBULATORY_CARE_PROVIDER_SITE_OTHER): Payer: BC Managed Care – PPO | Admitting: Family Medicine

## 2021-10-15 ENCOUNTER — Encounter: Payer: Self-pay | Admitting: Family Medicine

## 2021-10-15 DIAGNOSIS — J4520 Mild intermittent asthma, uncomplicated: Secondary | ICD-10-CM | POA: Diagnosis not present

## 2021-10-15 DIAGNOSIS — G43009 Migraine without aura, not intractable, without status migrainosus: Secondary | ICD-10-CM

## 2021-10-15 DIAGNOSIS — F909 Attention-deficit hyperactivity disorder, unspecified type: Secondary | ICD-10-CM

## 2021-10-15 MED ORDER — AMPHETAMINE-DEXTROAMPHET ER 25 MG PO CP24: 25.0000 mg | ORAL_CAPSULE | ORAL | 0 refills | Status: DC

## 2021-10-15 NOTE — Assessment & Plan Note (Signed)
Doing well, no symptoms.

## 2021-10-15 NOTE — Assessment & Plan Note (Signed)
Back to work, had baby in January , she would like to resume her pre-pregnancy dose  Adderal 25 mg

## 2021-10-15 NOTE — Assessment & Plan Note (Signed)
Reminded her to PCV 20

## 2021-10-31 ENCOUNTER — Other Ambulatory Visit: Payer: Self-pay

## 2021-10-31 ENCOUNTER — Other Ambulatory Visit: Payer: Self-pay | Admitting: Family Medicine

## 2021-10-31 DIAGNOSIS — F909 Attention-deficit hyperactivity disorder, unspecified type: Secondary | ICD-10-CM

## 2021-11-04 ENCOUNTER — Other Ambulatory Visit: Payer: Self-pay

## 2021-11-04 MED ORDER — AMPHETAMINE-DEXTROAMPHET ER 25 MG PO CP24
25.0000 mg | ORAL_CAPSULE | ORAL | 0 refills | Status: DC
Start: 2021-11-04 — End: 2022-01-15
  Filled 2021-11-04 – 2021-12-13 (×2): qty 40, 40d supply, fill #0

## 2021-11-17 ENCOUNTER — Other Ambulatory Visit: Payer: Self-pay

## 2021-12-13 ENCOUNTER — Other Ambulatory Visit: Payer: Self-pay

## 2022-01-14 NOTE — Progress Notes (Unsigned)
Name: Mary Kramer   MRN: [id number]    DOB: 1985/10/12   Date:01/15/2022       Progress Note  Subjective  Chief Complaint  Follow Up  HPI  ADHD: she was diagnosed as a child - in middle school. She took it off an on during high school, but resumed medication in her early twenties. She was on Adderal, was switched to Vyvanse but it caused her to feel sleepy and also moody when coming off medication. She was off Adderal during pregnancy, had c-section January 2023 and is went back to work on April 2023, we gave her Adderal 25 mg but she thinks it is too high of a dose and would like to go down on dose to 20 mg, she has been only been taking some of the container of the capsule. Explained ER formulation cannot be opened for use.   GAD: she was diagnosed with anxiety at age 61, she was having panic attacks and was given alprazolam after that klonopin, she took it prn for panic attacks of sleep. She took zoloft and lexapro in the past. Zoloft did not work but she was not taking it daily, lexapro caused nausea . Discussed meditation, medication and therapy with patient . Discussed options. We will try duloxetine GAD 7 is very high   Contraception: she has been taking medications to cycle every 3 months and doing well, except for little spotting during the third moth but not bothersome enough to make her change therapy at this time   RAD: still has albuterol at home, she denies sob , wheezing or cough at this time, she uses medication prn only   Migraine headaches: she states episodes happening a couple of times a month, usually triggered by strong scents, lack of sleep or barometric pressure changes, she has aura described as yawning but has not tried maxalt yet because Motrin control pain within  4-5 hours. She has associated nausea, mild photophobia but she keeps working. Pain is described as sharp or dull, on nuchal area, temporal or frontal area. She was diagnosed by a neurologist, she never  took a triptan.  She states she has two family members that had brain aneurysm - grandfather and one aunt at age 43. No longer having problems, states symptoms resolved during pregnancy , baby was delivered in 05/2021 and still no episodes. She has been having dull daily headaches, not severe but bothersome, she thinks secondary to higher dose of Adderal, does not want to add any new medications at this time   Patient Active Problem List   Diagnosis Date Noted   S/P cesarean section 06/17/2021   Delivery by emergency cesarean    History of loop electrosurgical excision procedure (LEEP) of cervix affecting pregnancy, antepartum 12/06/2020   Carrier of genetic disorder 10/31/2020   Asthma, stable, mild intermittent 01/07/2019   Controlled substance agreement signed 04/28/2017   Insomnia 08/10/2015   Vitamin D deficiency 08/10/2015   Migraine without aura 05/11/2015   ADHD (attention deficit hyperactivity disorder) 02/09/2015   Lump or mass in breast 11/15/2012    Past Surgical History:  Procedure Laterality Date   CERVICAL BIOPSY  W/ LOOP ELECTRODE EXCISION     CESAREAN SECTION  06/09/2021   Procedure: CESAREAN SECTION;  Surgeon: Vena Austria, MD;  Location: [facility] ORS;  Service: Obstetrics;;   DG HYSTEROGRAM (HSG)  07/30/2017        Family History  Problem Relation Age of Onset   Anxiety disorder Mother  Migraines Mother    Hyperlipidemia Father    Transient ischemic attack Father    Hypertension Father    Stroke Father    Lung disease Father    Atrial fibrillation Maternal Grandmother    Cancer Paternal Grandmother        leukemia   Diabetes Paternal Grandfather    Cerebral aneurysm Paternal Aunt    Cerebral aneurysm Maternal Grandfather     Social History   Tobacco Use   Smoking status: Never   Smokeless tobacco: Never  Substance Use Topics   Alcohol use: Not Currently    Comment: rarely     Current Outpatient Medications:    amphetamine-dextroamphetamine  (ADDERALL XR) 25 MG 24 hr capsule, Take 1 capsule by mouth every morning., Disp: 40 capsule, Rfl: 0   Levonorgestrel-Ethinyl Estradiol (AMETHIA) 0.15-0.03 &0.01 MG tablet, Take 1 tablet by mouth daily., Disp: 91 tablet, Rfl: 4  Allergies  Allergen Reactions   Lexapro [Escitalopram Oxalate] Nausea Only   Hydrocodone Rash   Vicodin [Hydrocodone-Acetaminophen] Rash    I personally reviewed active problem list, medication list, allergies, family history, social history, health maintenance with the patient/caregiver today.   ROS  Ten systems reviewed and is negative except as mentioned in HPI   Objective  Vitals:   01/15/22 1523  BP: 128/76  Pulse: 98  Resp: 16  SpO2: 98%  Weight: 139 lb (63 kg)  Height: 5\' 1"  (1.549 m)    Body mass index is 26.26 kg/m.  Physical Exam  Constitutional: Patient appears well-developed and well-nourished.  No distress.  HEENT: head atraumatic, normocephalic, pupils equal and reactive to light, neck supple Cardiovascular: Normal rate, regular rhythm and normal heart sounds.  No murmur heard. No BLE edema. Pulmonary/Chest: Effort normal and breath sounds normal. No respiratory distress. Abdominal: Soft.  There is no tenderness. Psychiatric: Patient has a normal mood and affect. behavior is normal. Judgment and thought content normal.    PHQ2/9:    01/15/2022    3:23 PM 10/15/2021    8:40 AM 07/16/2021    9:52 AM 09/04/2020    3:10 PM 06/06/2020    9:35 AM  Depression screen PHQ 2/9  Decreased Interest 0 0 0 0 0  Down, Depressed, Hopeless 0 0 0 0 0  PHQ - 2 Score 0 0 0 0 0  Altered sleeping 0 0 0  0  Tired, decreased energy 0 0 0  0  Change in appetite 0 0 0  0  Feeling bad or failure about yourself  0 0 0  0  Trouble concentrating 0 0 0  0  Moving slowly or fidgety/restless 0 0 0  0  Suicidal thoughts 0 0 0  0  PHQ-9 Score 0 0 0  0  Difficult doing work/chores     Not difficult at all    phq 9 is negative     01/15/2022    3:58 PM   GAD 7 : Generalized Anxiety Score  Nervous, Anxious, on Edge 3  Control/stop worrying 2  Worry too much - different things 2  Trouble relaxing 3  Restless 3  Easily annoyed or irritable 3  Afraid - awful might happen 3  Total GAD 7 Score 19  Anxiety Difficulty Very difficult     Fall Risk:    01/15/2022    3:22 PM 10/15/2021    8:40 AM 07/16/2021    9:52 AM 09/04/2020    3:05 PM 06/06/2020    9:35 AM  Fall  Risk   Falls in the past year? 0 0 0 0 0  Number falls in past yr: 0 0 0 0 0  Injury with Fall? 0 0 0 0 0  Risk for fall due to : No Fall Risks No Fall Risks No Fall Risks    Follow up Falls prevention discussed Falls prevention discussed Falls prevention discussed        Functional Status Survey: Is the patient deaf or have difficulty hearing?: No Does the patient have difficulty seeing, even when wearing glasses/contacts?: No Does the patient have difficulty concentrating, remembering, or making decisions?: No Does the patient have difficulty walking or climbing stairs?: No Does the patient have difficulty dressing or bathing?: No Does the patient have difficulty doing errands alone such as visiting a doctor's office or shopping?: No    Assessment & Plan  1. Attention deficit hyperactivity disorder (ADHD), unspecified ADHD type  - amphetamine-dextroamphetamine (ADDERALL XR) 20 MG 24 hr capsule; Take 1 capsule (20 mg total) by mouth every morning.  Dispense: 30 capsule; Refill: 0  2. Migraine without aura and without status migrainosus, not intractable  No recent episodes  3. Asthma, stable, mild intermittent  Stable  4. Frequent headaches  Discussed trying bet blocker to help with anxiety and headaches or nortriptyline but she does not want to add another medication, she states she will try to get her contacts refilled  5. Vitamin D deficiency   6. GAD (generalized anxiety disorder)  - DULoxetine (CYMBALTA) 30 MG capsule; Take 1 capsule (30 mg total) by  mouth daily.  Dispense: 30 capsule; Refill: 0   She will think about starting medication, explained possible side effects, including nausea is very common

## 2022-01-15 ENCOUNTER — Encounter: Payer: Self-pay | Admitting: Family Medicine

## 2022-01-15 ENCOUNTER — Ambulatory Visit (INDEPENDENT_AMBULATORY_CARE_PROVIDER_SITE_OTHER): Payer: BC Managed Care – PPO | Admitting: Family Medicine

## 2022-01-15 VITALS — BP 128/76 | HR 98 | Resp 16 | Ht 61.0 in | Wt 139.0 lb

## 2022-01-15 DIAGNOSIS — R519 Headache, unspecified: Secondary | ICD-10-CM

## 2022-01-15 DIAGNOSIS — E559 Vitamin D deficiency, unspecified: Secondary | ICD-10-CM

## 2022-01-15 DIAGNOSIS — J4520 Mild intermittent asthma, uncomplicated: Secondary | ICD-10-CM

## 2022-01-15 DIAGNOSIS — G43009 Migraine without aura, not intractable, without status migrainosus: Secondary | ICD-10-CM | POA: Diagnosis not present

## 2022-01-15 DIAGNOSIS — F909 Attention-deficit hyperactivity disorder, unspecified type: Secondary | ICD-10-CM

## 2022-01-15 DIAGNOSIS — F411 Generalized anxiety disorder: Secondary | ICD-10-CM

## 2022-01-15 MED ORDER — DULOXETINE HCL 30 MG PO CPEP: 30.0000 mg | ORAL_CAPSULE | Freq: Every day | ORAL | 0 refills | Status: DC

## 2022-01-15 MED ORDER — AMPHETAMINE-DEXTROAMPHET ER 20 MG PO CP24: 20.0000 mg | ORAL_CAPSULE | ORAL | 0 refills | Status: DC

## 2022-02-11 NOTE — Progress Notes (Unsigned)
Name: Mary Kramer   MRN: [id number]    DOB: [birth year]   Date:02/12/2022       Progress Note  Subjective  Chief Complaint  Medication Follow Up  I connected with  Mary Kramer  on 02/12/22 at  1:40 PM EDT by a video enabled telemedicine application and verified that I am speaking with the correct person using two identifiers.  I discussed the limitations of evaluation and management by telemedicine and the availability of in person appointments. The patient expressed understanding and agreed to proceed with the virtual visit  Staff also discussed with the patient that there may be a patient responsible charge related to this service. Patient Location: at work  Provider Location: [facility]  Additional Individuals present: alone   HPI  ADHD: she was diagnosed as a child - in middle school. She took it off an on during high school, but resumed medication in her early twenties. She was on Adderal, was switched to Vyvanse but it caused her to feel sleepy and also moody when coming off medication. She was off Adderal during pregnancy, had c-section January 2023 and is went back to work on April 2023, we gave her Adderal 25 mg but she was taking only some of the beads, explained ER not to be opened, she went to eye doctor and now that she is back wearing a newer type of contact lenses headaches resolved and is back on 25 mg without problems. She never filled rx of 20 mg   GAD: she was diagnosed with anxiety at age 89, she was having panic attacks and was given alprazolam after that klonopin, she took it prn for panic attacks of sleep. She took zoloft and lexapro in the past. Zoloft did not work but she was not taking it daily, lexapro caused nausea . We sent rx for Duloxetine 12/2021 but she never filled it, feeling better and does not want to try any new medications at this time     Patient Active Problem List   Diagnosis Date Noted   S/P cesarean section 06/17/2021   Delivery by  emergency cesarean    History of loop electrosurgical excision procedure (LEEP) of cervix affecting pregnancy, antepartum 12/06/2020   Carrier of genetic disorder 10/31/2020   Asthma, stable, mild intermittent 01/07/2019   Controlled substance agreement signed 04/28/2017   Insomnia 08/10/2015   Vitamin D deficiency 08/10/2015   Migraine without aura 05/11/2015   ADHD (attention deficit hyperactivity disorder) 02/09/2015   Lump or mass in breast 11/15/2012    Past Surgical History:  Procedure Laterality Date   CERVICAL BIOPSY  W/ LOOP ELECTRODE EXCISION     CESAREAN SECTION  06/09/2021   Procedure: CESAREAN SECTION;  Surgeon: Malachy Mood, MD;  Location: [facility] ORS;  Service: Obstetrics;;   DG HYSTEROGRAM (HSG)  07/30/2017        Family History  Problem Relation Age of Onset   Anxiety disorder Mother    Migraines Mother    Hyperlipidemia Father    Transient ischemic attack Father    Hypertension Father    Stroke Father    Lung disease Father    Atrial fibrillation Maternal Grandmother    Cancer Paternal Grandmother        leukemia   Diabetes Paternal Grandfather    Cerebral aneurysm Paternal Aunt    Cerebral aneurysm Maternal Grandfather     Social History   Socioeconomic History   Marital status: Single    Spouse name: Not on  file   Number of children: Not on file   Years of education: Not on file   Highest education level: Not on file  Occupational History   Not on file  Tobacco Use   Smoking status: Never   Smokeless tobacco: Never  Vaping Use   Vaping Use: Never used  Substance and Sexual Activity   Alcohol use: Not Currently    Comment: rarely   Drug use: No   Sexual activity: Yes    Birth control/protection: None  Other Topics Concern   Not on file  Social History Narrative   Not on file   Social Determinants of Health   Financial Resource Strain: Not on file  Food Insecurity: Not on file  Transportation Needs: Not on file  Physical Activity:  Insufficiently Active (07/01/2017)   Exercise Vital Sign    Days of Exercise per Week: 1 day    Minutes of Exercise per Session: 30 min  Stress: No Stress Concern Present (07/01/2017)   [employer] of Occupational Health - Occupational Stress Questionnaire    Feeling of Stress : Not at all  Social Connections: Moderately Integrated (07/01/2017)   Social Connection and Isolation Panel [NHANES]    Frequency of Communication with Friends and Family: More than three times a week    Frequency of Social Gatherings with Friends and Family: More than three times a week    Attends Religious Services: More than 4 times per year    Active Member of [employer] or Organizations: No    Attends Banker Meetings: Never    Marital Status: Married  Catering manager Violence: Not At Risk (07/01/2017)   Humiliation, Afraid, Rape, and Kick questionnaire    Fear of Current or Ex-Partner: No    Emotionally Abused: No    Physically Abused: No    Sexually Abused: No     Current Outpatient Medications:    DULoxetine (CYMBALTA) 30 MG capsule, Take 1 capsule (30 mg total) by mouth daily., Disp: 30 capsule, Rfl: 0   Levonorgestrel-Ethinyl Estradiol (AMETHIA) 0.15-0.03 &0.01 MG tablet, Take 1 tablet by mouth daily., Disp: 91 tablet, Rfl: 4  Allergies  Allergen Reactions   Lexapro [Escitalopram Oxalate] Nausea Only   Hydrocodone Rash   Vicodin [Hydrocodone-Acetaminophen] Rash    I personally reviewed active problem list, medication list, allergies, family history, social history, health maintenance with the patient/caregiver today.   ROS  Ten systems reviewed and is negative except as mentioned in HPI   Objective  Virtual encounter, vitals not obtained.  There is no height or weight on file to calculate BMI.  Physical Exam  Awake, alert and oriented  PHQ2/9:    02/12/2022    1:20 PM 01/15/2022    3:23 PM 10/15/2021    8:40 AM 07/16/2021    9:52 AM 09/04/2020    3:10 PM  Depression  screen PHQ 2/9  Decreased Interest 0 0 0 0 0  Down, Depressed, Hopeless 0 0 0 0 0  PHQ - 2 Score 0 0 0 0 0  Altered sleeping 0 0 0 0   Tired, decreased energy 0 0 0 0   Change in appetite 0 0 0 0   Feeling bad or failure about yourself  0 0 0 0   Trouble concentrating 0 0 0 0   Moving slowly or fidgety/restless 0 0 0 0   Suicidal thoughts 0 0 0 0   PHQ-9 Score 0 0 0 0    PHQ-2/9 Result is  negative.       02/12/2022    2:12 PM 01/15/2022    3:58 PM  GAD 7 : Generalized Anxiety Score  Nervous, Anxious, on Edge 1 3  Control/stop worrying 1 2  Worry too much - different things 0 2  Trouble relaxing 1 3  Restless 3 3  Easily annoyed or irritable 2 3  Afraid - awful might happen 2 3  Total GAD 7 Score 10 19  Anxiety Difficulty Somewhat difficult Very difficult      Fall Risk:    02/12/2022    1:20 PM 01/15/2022    3:22 PM 10/15/2021    8:40 AM 07/16/2021    9:52 AM 09/04/2020    3:05 PM  Fall Risk   Falls in the past year? 0 0 0 0 0  Number falls in past yr: 0 0 0 0 0  Injury with Fall? 0 0 0 0 0  Risk for fall due to : No Fall Risks No Fall Risks No Fall Risks No Fall Risks   Follow up Falls prevention discussed Falls prevention discussed Falls prevention discussed Falls prevention discussed      Assessment & Plan  1. Attention deficit hyperactivity disorder (ADHD), unspecified ADHD type  - amphetamine-dextroamphetamine (ADDERALL XR) 25 MG 24 hr capsule; Take 1 capsule by mouth every morning.  Dispense: 90 capsule; Refill: 0  2. GAD (generalized anxiety disorder)    I discussed the assessment and treatment plan with the patient. The patient was provided an opportunity to ask questions and all were answered. The patient agreed with the plan and demonstrated an understanding of the instructions.  The patient was advised to call back or seek an in-person evaluation if the symptoms worsen or if the condition fails to improve as anticipated.  I provided 15 minutes of  non-face-to-face time during this encounter.

## 2022-02-12 ENCOUNTER — Telehealth (INDEPENDENT_AMBULATORY_CARE_PROVIDER_SITE_OTHER): Payer: BC Managed Care – PPO | Admitting: Family Medicine

## 2022-02-12 ENCOUNTER — Encounter: Payer: Self-pay | Admitting: Family Medicine

## 2022-02-12 ENCOUNTER — Other Ambulatory Visit: Payer: Self-pay

## 2022-02-12 DIAGNOSIS — F411 Generalized anxiety disorder: Secondary | ICD-10-CM | POA: Diagnosis not present

## 2022-02-12 DIAGNOSIS — F909 Attention-deficit hyperactivity disorder, unspecified type: Secondary | ICD-10-CM | POA: Diagnosis not present

## 2022-02-12 MED ORDER — AMPHETAMINE-DEXTROAMPHET ER 25 MG PO CP24
25.0000 mg | ORAL_CAPSULE | ORAL | 0 refills | Status: DC
  Filled 2022-02-12 – 2022-02-17 (×2): qty 90, 90d supply, fill #0

## 2022-02-14 ENCOUNTER — Other Ambulatory Visit: Payer: Self-pay

## 2022-02-17 ENCOUNTER — Other Ambulatory Visit: Payer: Self-pay

## 2022-02-18 ENCOUNTER — Other Ambulatory Visit: Payer: Self-pay

## 2022-02-21 ENCOUNTER — Other Ambulatory Visit: Payer: Self-pay

## 2022-02-24 ENCOUNTER — Other Ambulatory Visit: Payer: Self-pay

## 2022-05-07 ENCOUNTER — Encounter: Payer: Self-pay | Admitting: Nurse Practitioner

## 2022-05-07 ENCOUNTER — Telehealth (INDEPENDENT_AMBULATORY_CARE_PROVIDER_SITE_OTHER): Payer: BC Managed Care – PPO | Admitting: Nurse Practitioner

## 2022-05-07 ENCOUNTER — Other Ambulatory Visit: Payer: Self-pay

## 2022-05-07 ENCOUNTER — Encounter: Payer: Self-pay | Admitting: Family Medicine

## 2022-05-07 VITALS — Temp 101.0°F

## 2022-05-07 DIAGNOSIS — U071 COVID-19: Secondary | ICD-10-CM | POA: Diagnosis not present

## 2022-05-07 MED ORDER — NIRMATRELVIR/RITONAVIR (PAXLOVID)TABLET: 3.0000 | ORAL_TABLET | Freq: Two times a day (BID) | ORAL | 0 refills | Status: AC

## 2022-05-07 NOTE — Progress Notes (Signed)
Name: Mary Kramer   MRN: [id number]    DOB: [birth year]   Date:05/07/2022       Progress Note  Subjective  Chief Complaint  Chief Complaint  Patient presents with   Covid Positive    Fever 101, sore throat, cough, started today    I connected with  Mary Kramer  on 05/07/22 at  1:40 PM EST by a video enabled telemedicine application and verified that I am speaking with the correct person using two identifiers.  I discussed the limitations of evaluation and management by telemedicine and the availability of in person appointments. The patient expressed understanding and agreed to proceed with a virtual visit  Staff also discussed with the patient that there may be a patient responsible charge related to this service. Patient Location: home Provider Location: [facility] Additional Individuals present: alone  HPI  Covid-19: She reports symptoms started this morning.  Patient did a COVID test at work and tested positive.  Patient reports symptoms include fever, body aches, congestion, cough.  States she has taken Motrin.  Patient denies any shortness of breath.  Discussed Paxlovid treatment.  Discussed side effects and administration.  Patient does want to take the treatment prescription sent in.  Can take vitamin C vitamin D and zinc.  Patient may also take Mucinex, Flonase and Zyrtec to help with symptoms.  Recommend pushing fluids and getting rest.  Discussed CDC guidelines for quarantine.  Offered Tessalon Perles patient declined at this time.  Patient Active Problem List   Diagnosis Date Noted   S/P cesarean section 06/17/2021   Delivery by emergency cesarean    History of loop electrosurgical excision procedure (LEEP) of cervix affecting pregnancy, antepartum 12/06/2020   Carrier of genetic disorder 10/31/2020   Asthma, stable, mild intermittent 01/07/2019   Controlled substance agreement signed 04/28/2017   Insomnia 08/10/2015   Vitamin D deficiency 08/10/2015   Migraine  without aura 05/11/2015   ADHD (attention deficit hyperactivity disorder) 02/09/2015   Lump or mass in breast 11/15/2012    Social History   Tobacco Use   Smoking status: Never   Smokeless tobacco: Never  Substance Use Topics   Alcohol use: Not Currently    Comment: rarely     Current Outpatient Medications:    amphetamine-dextroamphetamine (ADDERALL XR) 25 MG 24 hr capsule, Take 1 capsule by mouth every morning., Disp: 90 capsule, Rfl: 0   Levonorgestrel-Ethinyl Estradiol (AMETHIA) 0.15-0.03 &0.01 MG tablet, Take 1 tablet by mouth daily., Disp: 91 tablet, Rfl: 4  Allergies  Allergen Reactions   Lexapro [Escitalopram Oxalate] Nausea Only   Hydrocodone Rash   Vicodin [Hydrocodone-Acetaminophen] Rash    I personally reviewed active problem list, medication list, allergies with the patient/caregiver today.  ROS  Constitutional: positive for fever or, negative weight change. Positive for body aches HEENT:positive for nasal congestion Respiratory:positive for cough and  negative for shortness of breath.   Cardiovascular: Negative for chest pain or palpitations.  Gastrointestinal: Negative for abdominal pain, no bowel changes.  Musculoskeletal: Negative for gait problem or joint swelling.  Skin: Negative for rash.  Neurological: Negative for dizziness or headache.  No other specific complaints in a complete review of systems (except as listed in HPI above).   Objective  Virtual encounter, vitals not obtained.  There is no height or weight on file to calculate BMI.  Nursing Note and Vital Signs reviewed.  Physical Exam  Awake, alert and oriented, speaking in complete sentences  No results found for this  or any previous visit (from the past 72 hour(s)).  Assessment & Plan  1. COVID-19 Can take Mucinex, Flonase, Zyrtec Can also take vitamin C, vitamin D and zinc Recommend pushing fluids and getting rest CDC guidelines with quarantine - nirmatrelvir/ritonavir EUA  (PAXLOVID) 20 x 150 MG & 10 x 100MG  TABS; Take 3 tablets by mouth 2 (two) times daily for 5 days. (Take nirmatrelvir 150 mg two tablets twice daily for 5 days and ritonavir 100 mg one tablet twice daily for 5 days) Patient GFR is >60  Dispense: 30 tablet; Refill: 0   -Red flags and when to present for emergency care or RTC including fever >101.26F, chest pain, shortness of breath, new/worsening/un-resolving symptoms,  reviewed with patient at time of visit. Follow up and care instructions discussed and provided in AVS. - I discussed the assessment and treatment plan with the patient. The patient was provided an opportunity to ask questions and all were answered. The patient agreed with the plan and demonstrated an understanding of the instructions.  I provided 15 minutes of non-face-to-face time during this encounter.  , FNP

## 2022-05-08 ENCOUNTER — Other Ambulatory Visit: Payer: Self-pay | Admitting: Nurse Practitioner

## 2022-05-08 ENCOUNTER — Encounter: Payer: Self-pay | Admitting: Nurse Practitioner

## 2022-05-08 DIAGNOSIS — J4520 Mild intermittent asthma, uncomplicated: Secondary | ICD-10-CM

## 2022-05-08 MED ORDER — ALBUTEROL SULFATE HFA 108 (90 BASE) MCG/ACT IN AERS: 1.0000 | INHALATION_SPRAY | Freq: Four times a day (QID) | RESPIRATORY_TRACT | 3 refills | Status: DC | PRN

## 2022-05-08 MED ORDER — QVAR REDIHALER 80 MCG/ACT IN AERB: 2.0000 | INHALATION_SPRAY | Freq: Two times a day (BID) | RESPIRATORY_TRACT | 1 refills | Status: DC

## 2022-06-10 ENCOUNTER — Encounter: Payer: Self-pay | Admitting: Family Medicine

## 2022-07-03 DIAGNOSIS — D229 Melanocytic nevi, unspecified: Secondary | ICD-10-CM | POA: Diagnosis not present

## 2022-07-03 DIAGNOSIS — L821 Other seborrheic keratosis: Secondary | ICD-10-CM | POA: Diagnosis not present

## 2022-07-11 ENCOUNTER — Telehealth: Payer: BC Managed Care – PPO | Admitting: Family Medicine

## 2022-07-15 NOTE — Progress Notes (Unsigned)
Name: Mary Kramer   MRN: [id number]    DOB: 04/10/1986   Date:07/16/2022       Progress Note  Subjective  Chief Complaint  Follow Up  HPI  ADHD: she was diagnosed as a child - in middle school. She took medication off an on during high school, but resumed medication in her early twenties. She was on Adderal, was switched to Vyvanse but it caused her to feel sleepy and also moody when coming off medication. She was off Adderal during pregnancy, had c-section January 2023 and is went back to work on April 2023, we gave her Adderal 25 mg but she was taking only some of the beads, explained ER not to be opened. She skips medication on weekends. She denies side effects, weight has been stable.   GAD: she was diagnosed with anxiety at age 67, she was having panic attacks and was given alprazolam after that klonopin, she took it prn for panic attacks or sleep. She took zoloft and lexapro in the past. Zoloft did not work but she was not taking it daily, lexapro caused nausea . We sent rx for Duloxetine 12/2021 but she never filled it, today she asked if ocp could cause mood swings, explained more likely the anxiety that is making her act moody at home and advised to fill duloxetine and try it first . She is going to [facility] for a work shop in April, trip was scheduled last year, she is having difficulty sleeping since the trip is getting closer, worries about what could happen to her child while she is gone. She does not want to try low dose BZD at this time.   RAD: still has albuterol at home, she denies sob , wheezing or cough at this time, she uses medication prn only Unchanged  Migraine headaches: she states episodes happening a couple of times a month, usually triggered by strong scents, lack of sleep or barometric pressure changes, she has aura described as yawning but has not tried maxalt yet because Motrin control pain within  4-5 hours. She has associated nausea, mild photophobia but she  keeps working. Pain is described as sharp or dull, on nuchal area, temporal or frontal area. She was diagnosed by a neurologist, she never took a triptan.  She states she has two family members that had brain aneurysm - grandfather and one aunt at age 7. No recent episodes   Patient Active Problem List   Diagnosis Date Noted   S/P cesarean section 06/17/2021   Delivery by emergency cesarean    History of loop electrosurgical excision procedure (LEEP) of cervix affecting pregnancy, antepartum 12/06/2020   Carrier of genetic disorder 10/31/2020   Asthma, stable, mild intermittent 01/07/2019   Controlled substance agreement signed 04/28/2017   Insomnia 08/10/2015   Vitamin D deficiency 08/10/2015   Migraine without aura 05/11/2015   ADHD (attention deficit hyperactivity disorder) 02/09/2015   Lump or mass in breast 11/15/2012    Past Surgical History:  Procedure Laterality Date   CERVICAL BIOPSY  W/ LOOP ELECTRODE EXCISION     CESAREAN SECTION  06/09/2021   Procedure: CESAREAN SECTION;  Surgeon: Malachy Mood, MD;  Location: [facility] ORS;  Service: Obstetrics;;   DG HYSTEROGRAM (HSG)  07/30/2017        Family History  Problem Relation Age of Onset   Anxiety disorder Mother    Migraines Mother    Hyperlipidemia Father    Transient ischemic attack Father    Hypertension Father  Stroke Father    Lung disease Father    Atrial fibrillation Maternal Grandmother    Cancer Paternal Grandmother        leukemia   Diabetes Paternal Grandfather    Cerebral aneurysm Paternal Aunt    Cerebral aneurysm Maternal Grandfather     Social History   Tobacco Use   Smoking status: Never   Smokeless tobacco: Never  Substance Use Topics   Alcohol use: Not Currently    Comment: rarely     Current Outpatient Medications:    albuterol (VENTOLIN HFA) 108 (90 Base) MCG/ACT inhaler, Inhale 1-2 puffs into the lungs every 6 (six) hours as needed for wheezing or shortness of breath., Disp: 18 g,  Rfl: 3   amphetamine-dextroamphetamine (ADDERALL XR) 25 MG 24 hr capsule, Take 1 capsule by mouth every morning., Disp: 90 capsule, Rfl: 0   beclomethasone (QVAR REDIHALER) 80 MCG/ACT inhaler, Inhale 2 puffs into the lungs 2 (two) times daily., Disp: 1 each, Rfl: 1   Levonorgestrel-Ethinyl Estradiol (AMETHIA) 0.15-0.03 &0.01 MG tablet, Take 1 tablet by mouth daily., Disp: 91 tablet, Rfl: 4  Allergies  Allergen Reactions   Lexapro [Escitalopram Oxalate] Nausea Only   Hydrocodone Rash   Vicodin [Hydrocodone-Acetaminophen] Rash    I personally reviewed active problem list, medication list, allergies, family history, social history, health maintenance with the patient/caregiver today.   ROS  Ten systems reviewed and is negative except as mentioned in HPI   Objective  Vitals:   07/16/22 1114  BP: 110/72  Pulse: 96  Resp: 14  Temp: 98.3 F (36.8 C)  TempSrc: Oral  SpO2: 98%  Weight: 139 lb 11.2 oz (63.4 kg)  Height: 5' 1.25" (1.556 m)    Body mass index is 26.18 kg/m.  Physical Exam  Constitutional: Patient appears well-developed and well-nourished.  No distress.  HEENT: head atraumatic, normocephalic, pupils equal and reactive to light, neck supple Cardiovascular: Normal rate, regular rhythm and normal heart sounds.  No murmur heard. No BLE edema. Pulmonary/Chest: Effort normal and breath sounds normal. No respiratory distress. Abdominal: Soft.  There is no tenderness. Psychiatric: Patient has a normal mood and affect. behavior is normal. Judgment and thought content normal.    PHQ2/9:    07/16/2022   11:21 AM 05/07/2022    1:26 PM 02/12/2022    1:20 PM 01/15/2022    3:23 PM 10/15/2021    8:40 AM  Depression screen PHQ 2/9  Decreased Interest 0 0 0 0 0  Down, Depressed, Hopeless 0 0 0 0 0  PHQ - 2 Score 0 0 0 0 0  Altered sleeping 2  0 0 0  Tired, decreased energy 1  0 0 0  Change in appetite 0  0 0 0  Feeling bad or failure about yourself  0  0 0 0  Trouble  concentrating 0  0 0 0  Moving slowly or fidgety/restless 0  0 0 0  Suicidal thoughts 0  0 0 0  PHQ-9 Score 3  0 0 0  Difficult doing work/chores Not difficult at all        phq 9 is negative     07/16/2022   11:22 AM 02/12/2022    2:12 PM 01/15/2022    3:58 PM  GAD 7 : Generalized Anxiety Score  Nervous, Anxious, on Edge 1 1 3  $ Control/stop worrying 1 1 2  $ Worry too much - different things 0 0 2  Trouble relaxing 0 1 3  Restless 0 3 3  Easily annoyed or irritable 1 2 3  $ Afraid - awful might happen 3 2 3  $ Total GAD 7 Score 6 10 19  $ Anxiety Difficulty Not difficult at all Somewhat difficult Very difficult      Fall Risk:    07/16/2022   11:19 AM 05/07/2022    1:25 PM 02/12/2022    1:20 PM 01/15/2022    3:22 PM 10/15/2021    8:40 AM  Fall Risk   Falls in the past year? 0 0 0 0 0  Number falls in past yr:  0 0 0 0  Injury with Fall?  0 0 0 0  Risk for fall due to : No Fall Risks  No Fall Risks No Fall Risks No Fall Risks  Follow up Falls prevention discussed Falls evaluation completed Falls prevention discussed Falls prevention discussed Falls prevention discussed      Functional Status Survey: Is the patient deaf or have difficulty hearing?: No Does the patient have difficulty seeing, even when wearing glasses/contacts?: No Does the patient have difficulty concentrating, remembering, or making decisions?: No Does the patient have difficulty walking or climbing stairs?: No Does the patient have difficulty dressing or bathing?: No Does the patient have difficulty doing errands alone such as visiting a doctor's office or shopping?: No    Assessment & Plan  1. Attention deficit hyperactivity disorder (ADHD), unspecified ADHD type  - amphetamine-dextroamphetamine (ADDERALL XR) 25 MG 24 hr capsule; Take 1 capsule by mouth every morning.  Dispense: 90 capsule; Refill: 0   2. GAD (generalized anxiety disorder)  - DULoxetine (CYMBALTA) 30 MG capsule; Take 1 capsule (30 mg  total) by mouth daily.  Dispense: 30 capsule; Refill: 0  3. Migraine without aura and without status migrainosus, not intractable   4. Vitamin D deficiency   5. Asthma, stable, mild intermittent

## 2022-07-16 ENCOUNTER — Other Ambulatory Visit: Payer: Self-pay

## 2022-07-16 ENCOUNTER — Encounter: Payer: Self-pay | Admitting: Family Medicine

## 2022-07-16 ENCOUNTER — Ambulatory Visit: Payer: BC Managed Care – PPO | Admitting: Family Medicine

## 2022-07-16 VITALS — BP 110/72 | HR 96 | Temp 98.3°F | Resp 14 | Ht 61.25 in | Wt 139.7 lb

## 2022-07-16 DIAGNOSIS — F411 Generalized anxiety disorder: Secondary | ICD-10-CM

## 2022-07-16 DIAGNOSIS — G43009 Migraine without aura, not intractable, without status migrainosus: Secondary | ICD-10-CM

## 2022-07-16 DIAGNOSIS — E559 Vitamin D deficiency, unspecified: Secondary | ICD-10-CM | POA: Diagnosis not present

## 2022-07-16 DIAGNOSIS — F909 Attention-deficit hyperactivity disorder, unspecified type: Secondary | ICD-10-CM

## 2022-07-16 DIAGNOSIS — J4520 Mild intermittent asthma, uncomplicated: Secondary | ICD-10-CM

## 2022-07-16 MED ORDER — AMPHETAMINE-DEXTROAMPHET ER 25 MG PO CP24: 25.0000 mg | ORAL_CAPSULE | ORAL | 0 refills | Status: DC

## 2022-07-16 MED ORDER — DULOXETINE HCL 30 MG PO CPEP: 30.0000 mg | ORAL_CAPSULE | Freq: Every day | ORAL | 0 refills | Status: DC

## 2022-07-16 MED ORDER — DULOXETINE HCL 30 MG PO CPEP
30.0000 mg | ORAL_CAPSULE | Freq: Every day | ORAL | 0 refills | Status: DC
  Filled 2022-07-16: qty 30, 30d supply, fill #0

## 2022-07-16 MED ORDER — AMPHETAMINE-DEXTROAMPHET ER 25 MG PO CP24
25.0000 mg | ORAL_CAPSULE | ORAL | 0 refills | Status: DC
  Filled 2022-07-16 (×2): qty 90, 90d supply, fill #0

## 2022-07-17 ENCOUNTER — Other Ambulatory Visit: Payer: Self-pay

## 2022-08-04 ENCOUNTER — Other Ambulatory Visit: Payer: Self-pay | Admitting: Family Medicine

## 2022-08-04 ENCOUNTER — Encounter: Payer: Self-pay | Admitting: Family Medicine

## 2022-08-04 DIAGNOSIS — Z30018 Encounter for initial prescription of other contraceptives: Secondary | ICD-10-CM

## 2022-08-08 ENCOUNTER — Ambulatory Visit: Payer: BC Managed Care – PPO | Attending: Nurse Practitioner

## 2022-08-08 ENCOUNTER — Encounter: Payer: Self-pay | Admitting: Nurse Practitioner

## 2022-08-08 ENCOUNTER — Ambulatory Visit: Payer: BC Managed Care – PPO | Admitting: Nurse Practitioner

## 2022-08-08 VITALS — BP 120/80 | HR 98 | Temp 98.4°F | Resp 16 | Ht 61.25 in | Wt 137.5 lb

## 2022-08-08 DIAGNOSIS — R002 Palpitations: Secondary | ICD-10-CM | POA: Diagnosis not present

## 2022-08-08 NOTE — Progress Notes (Signed)
BP 120/80   Pulse 98   Temp 98.4 F (36.9 C) (Oral)   Resp 16   Ht 5' 1.25" (1.556 m)   Wt 137 lb 8 oz (62.4 kg)   SpO2 99%   BMI 25.77 kg/m    Subjective:    Patient ID: Mary Kramer, female    DOB: 1985/10/11, 37 y.o.   MRN: [id number]  HPI: Mary Kramer is a 37 y.o. female  Chief Complaint  Patient presents with   Palpitations    For 2 weeks   Palpitations: she says that started she has had palpitations in the last week.  She says that it has gotten increasingly worse over the last few days.  She says that she feels like her heart is pounding, she can sometimes feel it in her neck. She says that her dad has a history of stroke, atrial fibrillation and heart attack.  She says she is not a smoker. She has not had any change in medications.  She is on adderall but no change in dosage and she has been on that for a long time.  She has not increased her caffeine intake. She denies any abnormal bleeding.  She says she does have some shortness of breath occasionally.  She says that the shortness of breath gets better when her heart stop racing.  She says she did have episodes of like this in 2016-2017 but then they went away she did not have a work up at this time.  She says there were no stressors.  Will get EKG which was normal sinus rhythm, will get labs and zio patch.   Relevant past medical, surgical, family and social history reviewed and updated as indicated. Interim medical history since our last visit reviewed. Allergies and medications reviewed and updated.  Review of Systems  Constitutional: Negative for fever or weight change.  Respiratory: Negative for cough and shortness of breath.   Cardiovascular: Negative for chest pain, positive for palpitations.  Gastrointestinal: Negative for abdominal pain, no bowel changes.  Musculoskeletal: Negative for gait problem or joint swelling.  Skin: Negative for rash.  Neurological: Negative for dizziness or headache.   No other specific complaints in a complete review of systems (except as listed in HPI above).      Objective:    BP 120/80   Pulse 98   Temp 98.4 F (36.9 C) (Oral)   Resp 16   Ht 5' 1.25" (1.556 m)   Wt 137 lb 8 oz (62.4 kg)   SpO2 99%   BMI 25.77 kg/m   Wt Readings from Last 3 Encounters:  08/08/22 137 lb 8 oz (62.4 kg)  07/16/22 139 lb 11.2 oz (63.4 kg)  01/15/22 139 lb (63 kg)    Physical Exam  Constitutional: Patient appears well-developed and well-nourished.  No distress.  HEENT: head atraumatic, normocephalic, pupils equal and reactive to light, neck supple Cardiovascular: Normal rate, regular rhythm and normal heart sounds.  No murmur heard. No BLE edema. Pulmonary/Chest: Effort normal and breath sounds normal. No respiratory distress. Abdominal: Soft.  There is no tenderness. Psychiatric: Patient has a normal mood and affect. behavior is normal. Judgment and thought content normal.      Assessment & Plan:   Problem List Items Addressed This Visit   None Visit Diagnoses     Palpitations    -  Primary   EKG normal sinus rhythm,  get labs and zio patch.   Relevant Orders   CBC with  Differential/Platelet   COMPLETE METABOLIC PANEL WITH GFR   TSH   LONG TERM MONITOR (3-14 DAYS)        Follow up plan: Return if symptoms worsen or fail to improve, for follow up depending on results.

## 2022-08-09 LAB — CBC WITH DIFFERENTIAL/PLATELET
Absolute Monocytes: 703 cells/uL (ref 200–950)
Basophils Absolute: 53 cells/uL (ref 0–200)
Basophils Relative: 0.6 %
Eosinophils Absolute: 62 cells/uL (ref 15–500)
Eosinophils Relative: 0.7 %
HCT: 42.8 % (ref 35.0–45.0)
Hemoglobin: 14.6 g/dL (ref 11.7–15.5)
Lymphs Abs: 2982 cells/uL (ref 850–3900)
MCH: 30.3 pg (ref 27.0–33.0)
MCHC: 34.1 g/dL (ref 32.0–36.0)
MCV: 88.8 fL (ref 80.0–100.0)
MPV: 9.9 fL (ref 7.5–12.5)
Monocytes Relative: 7.9 %
Neutro Abs: 5100 cells/uL (ref 1500–7800)
Neutrophils Relative %: 57.3 %
Platelets: 344 10*3/uL (ref 140–400)
RBC: 4.82 10*6/uL (ref 3.80–5.10)
RDW: 12.1 % (ref 11.0–15.0)
Total Lymphocyte: 33.5 %
WBC: 8.9 10*3/uL (ref 3.8–10.8)

## 2022-08-09 LAB — COMPLETE METABOLIC PANEL WITH GFR
AG Ratio: 1.6 (calc) (ref 1.0–2.5)
ALT: 19 U/L (ref 6–29)
AST: 18 U/L (ref 10–30)
Albumin: 4.4 g/dL (ref 3.6–5.1)
Alkaline phosphatase (APISO): 41 U/L (ref 31–125)
BUN: 13 mg/dL (ref 7–25)
CO2: 26 mmol/L (ref 20–32)
Calcium: 9.2 mg/dL (ref 8.6–10.2)
Chloride: 104 mmol/L (ref 98–110)
Creat: 0.76 mg/dL (ref 0.50–0.97)
Globulin: 2.7 g/dL (calc) (ref 1.9–3.7)
Glucose, Bld: 102 mg/dL — ABNORMAL HIGH (ref 65–99)
Potassium: 3.9 mmol/L (ref 3.5–5.3)
Sodium: 140 mmol/L (ref 135–146)
Total Bilirubin: 0.3 mg/dL (ref 0.2–1.2)
Total Protein: 7.1 g/dL (ref 6.1–8.1)
eGFR: 103 mL/min/{1.73_m2} (ref >=60)

## 2022-08-09 LAB — TSH: TSH: 3.34 mIU/L

## 2022-08-12 DIAGNOSIS — R002 Palpitations: Secondary | ICD-10-CM

## 2022-08-14 DIAGNOSIS — Z1239 Encounter for other screening for malignant neoplasm of breast: Secondary | ICD-10-CM | POA: Diagnosis not present

## 2022-08-14 DIAGNOSIS — Z124 Encounter for screening for malignant neoplasm of cervix: Secondary | ICD-10-CM | POA: Diagnosis not present

## 2022-08-14 DIAGNOSIS — Z1331 Encounter for screening for depression: Secondary | ICD-10-CM | POA: Diagnosis not present

## 2022-08-14 DIAGNOSIS — Z01419 Encounter for gynecological examination (general) (routine) without abnormal findings: Secondary | ICD-10-CM | POA: Diagnosis not present

## 2022-09-04 DIAGNOSIS — R002 Palpitations: Secondary | ICD-10-CM | POA: Diagnosis not present

## 2022-09-11 ENCOUNTER — Other Ambulatory Visit: Payer: Self-pay

## 2022-09-11 ENCOUNTER — Encounter: Payer: Self-pay | Admitting: Family Medicine

## 2022-09-11 DIAGNOSIS — Z30018 Encounter for initial prescription of other contraceptives: Secondary | ICD-10-CM

## 2022-09-11 MED ORDER — LEVONORGEST-ETH ESTRAD 91-DAY 0.15-0.03 &0.01 MG PO TABS: 1.0000 | ORAL_TABLET | Freq: Every day | ORAL | 0 refills | Status: DC

## 2022-09-23 DIAGNOSIS — D2272 Melanocytic nevi of left lower limb, including hip: Secondary | ICD-10-CM | POA: Diagnosis not present

## 2022-09-23 DIAGNOSIS — D225 Melanocytic nevi of trunk: Secondary | ICD-10-CM | POA: Diagnosis not present

## 2022-09-23 DIAGNOSIS — D2261 Melanocytic nevi of right upper limb, including shoulder: Secondary | ICD-10-CM | POA: Diagnosis not present

## 2022-09-23 DIAGNOSIS — L718 Other rosacea: Secondary | ICD-10-CM | POA: Diagnosis not present

## 2022-09-24 NOTE — Telephone Encounter (Signed)
Error

## 2022-10-13 NOTE — Progress Notes (Unsigned)
Name: Mary Kramer   MRN: [id number]    DOB: 1985-11-11   Date:10/14/2022       Progress Note  Subjective  Chief Complaint  Follow Up  HPI  ADHD: she was diagnosed as a child - in middle school. She took medication off an on during high school, but resumed medication in her early twenties. She was on Adderal, was switched to Vyvanse but it caused her to feel sleepy and also moody when coming off medication. She was off Adderal during pregnancy, had c-section January 2023 and is went back to work on April 2023, we gave her Adderal 25 mg but she was taking only some of the beads, since last visit she has been taking the entire capsule and doing well now We will start seeing her every 6 month and prn   GAD: she was diagnosed with anxiety at age 74, she was having panic attacks and was given alprazolam after that klonopin, she took it prn for panic attacks or sleep. She took zoloft and lexapro in the past. Zoloft did not work but she was not taking it daily, lexapro caused nausea . We sent rx for Duloxetine 12/2021 but she never filled it, today she asked if ocp could cause mood swings, explained more likely the anxiety that is making her act moody at home and advised to fill duloxetine and try it first . She was very anxious prior to her trip to [facility] , she was very anxious about living her son behind. She also had palpitations leading to the trip but zio patch was negative. No symptoms since.   RAD: still has albuterol at home, she denies sob , wheezing or cough at this time, she uses medication prn only She has been trying to walk more often   Migraine headaches: she states episodes happening a couple of times a month, usually triggered by strong scents, lack of sleep or barometric pressure changes, she has aura described as yawning but has not tried maxalt yet because Motrin control pain within  4-5 hours. She has associated nausea, mild photophobia but she keeps working. Pain is described  as sharp or dull, on nuchal area, temporal or frontal area. She was diagnosed by a neurologist, she never took a triptan.  She states she has two family members that had brain aneurysm - grandfather and one aunt at age 75. Doing well   Patient Active Problem List   Diagnosis Date Noted   S/P cesarean section 06/17/2021   Delivery by emergency cesarean    History of loop electrosurgical excision procedure (LEEP) of cervix affecting pregnancy, antepartum 12/06/2020   Carrier of genetic disorder 10/31/2020   Asthma, stable, mild intermittent 01/07/2019   Controlled substance agreement signed 04/28/2017   Insomnia 08/10/2015   Vitamin D deficiency 08/10/2015   Migraine without aura 05/11/2015   ADHD (attention deficit hyperactivity disorder) 02/09/2015   Lump or mass in breast 11/15/2012    Past Surgical History:  Procedure Laterality Date   CERVICAL BIOPSY  W/ LOOP ELECTRODE EXCISION     CESAREAN SECTION  06/09/2021   Procedure: CESAREAN SECTION;  Surgeon: Vena Austria, MD;  Location: [facility] ORS;  Service: Obstetrics;;   DG HYSTEROGRAM (HSG)  07/30/2017        Family History  Problem Relation Age of Onset   Anxiety disorder Mother    Migraines Mother    Hyperlipidemia Father    Transient ischemic attack Father    Hypertension Father  Stroke Father    Lung disease Father    Atrial fibrillation Maternal Grandmother    Cancer Paternal Grandmother        leukemia   Diabetes Paternal Grandfather    Cerebral aneurysm Paternal Aunt    Cerebral aneurysm Maternal Grandfather     Social History   Tobacco Use   Smoking status: Never   Smokeless tobacco: Never  Substance Use Topics   Alcohol use: Not Currently    Comment: rarely     Current Outpatient Medications:    amphetamine-dextroamphetamine (ADDERALL XR) 25 MG 24 hr capsule, Take 1 capsule by mouth every morning., Disp: 90 capsule, Rfl: 0   Levonorgestrel-Ethinyl Estradiol (SIMPESSE) 0.15-0.03 &0.01 MG tablet, Take 1  tablet by mouth daily., Disp: 30 tablet, Rfl: 0   amphetamine-dextroamphetamine (ADDERALL XR) 25 MG 24 hr capsule, Take 1 capsule by mouth every morning., Disp: 90 capsule, Rfl: 0  Allergies  Allergen Reactions   Lexapro [Escitalopram Oxalate] Nausea Only   Hydrocodone Rash   Vicodin [Hydrocodone-Acetaminophen] Rash    I personally reviewed active problem list, medication list, allergies, family history, social history, health maintenance with the patient/caregiver today.   ROS  Ten systems reviewed and is negative except as mentioned in HPI   Objective  Vitals:   10/14/22 1134  BP: 114/72  Pulse: 91  Resp: 12  Temp: 98.2 F (36.8 C)  TempSrc: Oral  SpO2: 98%  Weight: 140 lb (63.5 kg)  Height: 5' 1.5" (1.562 m)    Body mass index is 26.02 kg/m.  Physical Exam  Constitutional: Patient appears well-developed and well-nourished. Obese  No distress.  HEENT: head atraumatic, normocephalic, pupils equal and reactive to light, neck supple Cardiovascular: Normal rate, regular rhythm and normal heart sounds.  No murmur heard. No BLE edema. Pulmonary/Chest: Effort normal and breath sounds normal. No respiratory distress. Abdominal: Soft.  There is no tenderness. Psychiatric: Patient has a normal mood and affect. behavior is normal. Judgment and thought content normal.   Recent Results (from the past 2160 hour(s))  CBC with Differential/Platelet     Status: None   Collection Time: 08/08/22  2:45 PM  Result Value Ref Range   WBC 8.9 3.8 - 10.8 Thousand/uL   RBC 4.82 3.80 - 5.10 Million/uL   Hemoglobin 14.6 11.7 - 15.5 g/dL   HCT 54.0 98.1 - 19.1 %   MCV 88.8 80.0 - 100.0 fL   MCH 30.3 27.0 - 33.0 pg   MCHC 34.1 32.0 - 36.0 g/dL   RDW 47.8 29.5 - 62.1 %   Platelets 344 140 - 400 Thousand/uL   MPV 9.9 7.5 - 12.5 fL   Neutro Abs 5,100 1,500 - 7,800 cells/uL   Lymphs Abs 2,982 850 - 3,900 cells/uL   Absolute Monocytes 703 200 - 950 cells/uL   Eosinophils Absolute 62 15 -  500 cells/uL   Basophils Absolute 53 0 - 200 cells/uL   Neutrophils Relative % 57.3 %   Total Lymphocyte 33.5 %   Monocytes Relative 7.9 %   Eosinophils Relative 0.7 %   Basophils Relative 0.6 %  COMPLETE METABOLIC PANEL WITH GFR     Status: Abnormal   Collection Time: 08/08/22  2:45 PM  Result Value Ref Range   Glucose, Bld 102 (H) 65 - 99 mg/dL    Comment: .            Fasting reference interval . For someone without known diabetes, a glucose value between 100 and 125 mg/dL is consistent  with prediabetes and should be confirmed with a follow-up test. .    BUN 13 7 - 25 mg/dL   Creat 8.29 5.62 - 1.30 mg/dL   eGFR 865 > OR = 60 HQ/ION/6.29B2   BUN/Creatinine Ratio SEE NOTE: 6 - 22 (calc)    Comment:    Not Reported: BUN and Creatinine are within    reference range. .    Sodium 140 135 - 146 mmol/L   Potassium 3.9 3.5 - 5.3 mmol/L   Chloride 104 98 - 110 mmol/L   CO2 26 20 - 32 mmol/L   Calcium 9.2 8.6 - 10.2 mg/dL   Total Protein 7.1 6.1 - 8.1 g/dL   Albumin 4.4 3.6 - 5.1 g/dL   Globulin 2.7 1.9 - 3.7 g/dL (calc)   AG Ratio 1.6 1.0 - 2.5 (calc)   Total Bilirubin 0.3 0.2 - 1.2 mg/dL   Alkaline phosphatase (APISO) 41 31 - 125 U/L   AST 18 10 - 30 U/L   ALT 19 6 - 29 U/L  TSH     Status: None   Collection Time: 08/08/22  2:45 PM  Result Value Ref Range   TSH 3.34 mIU/L    Comment:           Reference Range .           > or = 20 Years  0.40-4.50 .                Pregnancy Ranges           First trimester    0.26-2.66           Second trimester   0.55-2.73           Third trimester    0.43-2.91     PHQ2/9:    10/14/2022   11:38 AM 08/08/2022    2:16 PM 07/16/2022   11:21 AM 05/07/2022    1:26 PM 02/12/2022    1:20 PM  Depression screen PHQ 2/9  Decreased Interest 0 0 0 0 0  Down, Depressed, Hopeless 0 0 0 0 0  PHQ - 2 Score 0 0 0 0 0  Altered sleeping 1 0 2  0  Tired, decreased energy 1 0 1  0  Change in appetite 0 0 0  0  Feeling bad or failure about  yourself  0 0 0  0  Trouble concentrating 0 0 0  0  Moving slowly or fidgety/restless 0 0 0  0  Suicidal thoughts 0 0 0  0  PHQ-9 Score 2 0 3  0  Difficult doing work/chores Not difficult at all Not difficult at all Not difficult at all      phq 9 is negative   Fall Risk:    10/14/2022   11:33 AM 08/08/2022    2:15 PM 07/16/2022   11:19 AM 05/07/2022    1:25 PM 02/12/2022    1:20 PM  Fall Risk   Falls in the past year? 0 0 0 0 0  Number falls in past yr:  0  0 0  Injury with Fall?  0  0 0  Risk for fall due to : No Fall Risks  No Fall Risks  No Fall Risks  Follow up Falls prevention discussed  Falls prevention discussed Falls evaluation completed Falls prevention discussed     Assessment & Plan  1. Attention deficit hyperactivity disorder (ADHD), unspecified ADHD type  - amphetamine-dextroamphetamine (ADDERALL XR) 25 MG 24 hr  capsule; Take 1 capsule by mouth every morning.  Dispense: 90 capsule; Refill: 0 - amphetamine-dextroamphetamine (ADDERALL XR) 25 MG 24 hr capsule; Take 1 capsule by mouth every morning.  Dispense: 90 capsule; Refill: 0    2. Migraine without aura and without status migrainosus, not intractable  Doing well   3. GAD (generalized anxiety disorder)   4. Asthma, stable, mild intermittent   Doing well

## 2022-10-14 ENCOUNTER — Encounter: Payer: Self-pay | Admitting: Family Medicine

## 2022-10-14 ENCOUNTER — Ambulatory Visit: Payer: BC Managed Care – PPO | Admitting: Family Medicine

## 2022-10-14 ENCOUNTER — Other Ambulatory Visit: Payer: Self-pay

## 2022-10-14 VITALS — BP 114/72 | HR 91 | Temp 98.2°F | Resp 12 | Ht 61.5 in | Wt 140.0 lb

## 2022-10-14 DIAGNOSIS — F411 Generalized anxiety disorder: Secondary | ICD-10-CM | POA: Insufficient documentation

## 2022-10-14 DIAGNOSIS — G43009 Migraine without aura, not intractable, without status migrainosus: Secondary | ICD-10-CM | POA: Diagnosis not present

## 2022-10-14 DIAGNOSIS — J4520 Mild intermittent asthma, uncomplicated: Secondary | ICD-10-CM

## 2022-10-14 DIAGNOSIS — F909 Attention-deficit hyperactivity disorder, unspecified type: Secondary | ICD-10-CM | POA: Diagnosis not present

## 2022-10-14 MED ORDER — AMPHETAMINE-DEXTROAMPHET ER 25 MG PO CP24
25.0000 mg | ORAL_CAPSULE | ORAL | 0 refills | Status: DC
  Filled 2022-10-14 – 2023-03-12 (×2): qty 90, 90d supply, fill #0

## 2022-10-14 MED ORDER — AMPHETAMINE-DEXTROAMPHET ER 25 MG PO CP24
25.0000 mg | ORAL_CAPSULE | ORAL | 0 refills | Status: DC
  Filled 2022-10-14 – 2022-11-19 (×2): qty 90, 90d supply, fill #0

## 2022-10-24 ENCOUNTER — Other Ambulatory Visit: Payer: Self-pay

## 2022-11-19 ENCOUNTER — Other Ambulatory Visit: Payer: Self-pay

## 2022-12-01 ENCOUNTER — Encounter: Payer: Self-pay | Admitting: Family Medicine

## 2022-12-01 ENCOUNTER — Other Ambulatory Visit: Payer: Self-pay | Admitting: Family Medicine

## 2022-12-01 ENCOUNTER — Other Ambulatory Visit: Payer: Self-pay

## 2022-12-01 DIAGNOSIS — Z30018 Encounter for initial prescription of other contraceptives: Secondary | ICD-10-CM

## 2022-12-01 MED ORDER — DAYSEE 0.15-0.03 &0.01 MG PO TABS: 1.0000 | ORAL_TABLET | Freq: Every day | ORAL | 0 refills | Status: DC

## 2023-01-06 ENCOUNTER — Telehealth: Payer: BC Managed Care – PPO | Admitting: Physician Assistant

## 2023-01-06 DIAGNOSIS — H6992 Unspecified Eustachian tube disorder, left ear: Secondary | ICD-10-CM | POA: Diagnosis not present

## 2023-01-06 MED ORDER — PREDNISONE 10 MG PO TABS
30.0000 mg | ORAL_TABLET | Freq: Every day | ORAL | 0 refills | Status: AC
Start: 2023-01-06 — End: 2023-01-11

## 2023-01-06 MED ORDER — FLUTICASONE PROPIONATE 50 MCG/ACT NA SUSP: 2.0000 | Freq: Every day | NASAL | 0 refills | Status: AC

## 2023-01-06 MED ORDER — AZITHROMYCIN 250 MG PO TABS: ORAL_TABLET | ORAL | 0 refills | Status: AC

## 2023-01-06 MED ORDER — PREDNISONE 10 MG PO TABS: 10.0000 mg | ORAL_TABLET | Freq: Every day | ORAL | 0 refills | Status: DC

## 2023-01-06 NOTE — Progress Notes (Signed)
Virtual Visit Consent   Mary Kramer, you are scheduled for a virtual visit with a [facility] provider today. Just as with appointments in the office, your consent must be obtained to participate. Your consent will be active for this visit and any virtual visit you may have with one of our providers in the next 365 days. If you have a MyChart account, a copy of this consent can be sent to you electronically.  As this is a virtual visit, video technology does not allow for your provider to perform a traditional examination. This may limit your provider's ability to fully assess your condition. If your provider identifies any concerns that need to be evaluated in person or the need to arrange testing (such as labs, EKG, etc.), we will make arrangements to do so. Although advances in technology are sophisticated, we cannot ensure that it will always work on either your end or our end. If the connection with a video visit is poor, the visit may have to be switched to a telephone visit. With either a video or telephone visit, we are not always able to ensure that we have a secure connection.  By engaging in this virtual visit, you consent to the provision of healthcare and authorize for your insurance to be billed (if applicable) for the services provided during this visit. Depending on your insurance coverage, you may receive a charge related to this service.  I need to obtain your verbal consent now. Are you willing to proceed with your visit today? Avinell Kerley Kuzia has provided verbal consent on 01/06/2023 for a virtual visit (video or telephone). Mary Kramer, [place]  Date: 01/06/2023 7:31 PM  Virtual Visit via Video Note   I, Mary Kramer, connected with  Briena Maltbie  ([id number], 11-15-1985) on 01/06/23 at  7:15 PM EDT by a video-enabled telemedicine application and verified that I am speaking with the correct person using two identifiers.  Location: Patient: Virtual  Visit Location Patient: Home Provider: Virtual Visit Location Provider: Home Office   I discussed the limitations of evaluation and management by telemedicine and the availability of in person appointments. The patient expressed understanding and agreed to proceed.    History of Present Illness: Mary Kramer is a 37 y.o. who identifies as a female who was assigned female at birth, and is being seen today for left ear pressure and fullness. Denies overt pain but notes popping occasionally. Had this issue a couple of weeks ago that resolved before a trip to mountains. Since then has recurred and been continual. Notes occasional allergy symptoms of nasal congestion and rhinorrhea. Denies fever, chills, malaise. Denies drainage from ear, tinnitus, change in hearing or dizziness. Denies associated sinus pressure or pain. Occasional symptoms of R ear. 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  HPI: HPI  Problems:  Patient Active Problem List   Diagnosis Date Noted   GAD (generalized anxiety disorder) 10/14/2022   S/P cesarean section 06/17/2021   Delivery by emergency cesarean    History of loop electrosurgical excision procedure (LEEP) of cervix affecting pregnancy, antepartum 12/06/2020   Carrier of genetic disorder 10/31/2020   Asthma, stable, mild intermittent 01/07/2019   Controlled substance agreement signed 04/28/2017   Insomnia 08/10/2015   Vitamin D deficiency 08/10/2015   Migraine without aura 05/11/2015  ADHD (attention deficit hyperactivity disorder) 02/09/2015   Lump or mass in breast 11/15/2012    Allergies:  Allergies  Allergen Reactions   Lexapro [Escitalopram Oxalate] Nausea Only   Hydrocodone Rash   Vicodin [Hydrocodone-Acetaminophen] Rash   Medications:  Current Outpatient Medications:    azithromycin (ZITHROMAX)  250 MG tablet, Take 2 tablets on day 1, then 1 tablet daily on days 2 through 5, Disp: 6 tablet, Rfl: 0   fluticasone (FLONASE) 50 MCG/ACT nasal spray, Place 2 sprays into both nostrils daily., Disp: 16 g, Rfl: 0   amphetamine-dextroamphetamine (ADDERALL XR) 25 MG 24 hr capsule, Take 1 capsule by mouth every morning., Disp: 90 capsule, Rfl: 0   amphetamine-dextroamphetamine (ADDERALL XR) 25 MG 24 hr capsule, Take 1 capsule by mouth every morning., Disp: 90 capsule, Rfl: 0   DAYSEE 0.15-0.03 &0.01 MG tablet, Take 1 tablet by mouth daily., Disp: 90 tablet, Rfl: 0   predniSONE (DELTASONE) 10 MG tablet, Take 3 tablets (30 mg total) by mouth daily with breakfast for 5 days., Disp: 15 tablet, Rfl: 0  Observations/Objective: Patient is well-developed, well-nourished in no acute distress.  Resting comfortably at home.  Head is normocephalic, atraumatic.  No labored breathing. Speech is clear and coherent with logical content.  Patient is alert and oriented at baseline.   Assessment and Plan: 1. Eustachian tube dysfunction, left - fluticasone (FLONASE) 50 MCG/ACT nasal spray; Place 2 sprays into both nostrils daily.  Dispense: 16 g; Refill: 0 - predniSONE (DELTASONE) 10 MG tablet; Take 3 tablets (30 mg total) by mouth daily with breakfast for 5 days.  Dispense: 15 tablet; Refill: 0  Bilateral but very mild R ear symptoms. Supportive measures and OTC medications reviewed. Start Claritin-D OTC. Flonase and Prednisone per orders. Discussed no symptoms or exam findings noted from coworker to suggest an acute otitis media. Discussed if pain of fever develop, would be concern for this. Azithromycin placed on hold at pharmacy in case this occurs. If symptoms not resolving, needs PCP follow-up with possible referral to ENT.   Follow Up Instructions: I discussed the assessment and treatment plan with the patient. The patient was provided an opportunity to ask questions and all were answered. The patient agreed  with the plan and demonstrated an understanding of the instructions.  A copy of instructions were sent to the patient via MyChart unless otherwise noted below.   The patient was advised to call back or seek an in-person evaluation if the symptoms worsen or if the condition fails to improve as anticipated.  Time:  I spent 10 minutes with the patient via telehealth technology discussing the above problems/concerns.    Mary Climes, PA-C

## 2023-01-06 NOTE — Patient Instructions (Signed)
Barkley Boards, thank you for joining [name], PA-C for today's virtual visit.  While this provider is not your primary care provider (PCP), if your PCP is located in our provider database this encounter information will be shared with them immediately following your visit.   A [facility] MyChart account gives you access to today's visit and all your visits, tests, and labs performed at [facility] " click here if you don't have a [facility] MyChart account or go to [email or web address]  Consent: (Patient) Mary Kramer provided verbal consent for this virtual visit at the beginning of the encounter.  Current Medications:  Current Outpatient Medications:    amphetamine-dextroamphetamine (ADDERALL XR) 25 MG 24 hr capsule, Take 1 capsule by mouth every morning., Disp: 90 capsule, Rfl: 0   amphetamine-dextroamphetamine (ADDERALL XR) 25 MG 24 hr capsule, Take 1 capsule by mouth every morning., Disp: 90 capsule, Rfl: 0   DAYSEE 0.15-0.03 &0.01 MG tablet, Take 1 tablet by mouth daily., Disp: 90 tablet, Rfl: 0   Medications ordered in this encounter:  No orders of the defined types were placed in this encounter.    *If you need refills on other medications prior to your next appointment, please contact your pharmacy*  Follow-Up: Call back or seek an in-person evaluation if the symptoms worsen or if the condition fails to improve as anticipated.   Virtual Care [phone]  Other Instructions Eustachian Tube Dysfunction  Eustachian tube dysfunction refers to a condition in which a blockage develops in the narrow passage that connects the middle ear to the back of the nose (eustachian tube). The eustachian tube regulates air pressure in the middle ear by letting air move between the ear and nose. It also helps to drain fluid from the middle ear space. Eustachian tube dysfunction can affect one or both ears. When the eustachian tube does not  function properly, air pressure, fluid, or both can build up in the middle ear. What are the causes? This condition occurs when the eustachian tube becomes blocked or cannot open normally. Common causes of this condition include: Ear infections. Colds and other infections that affect the nose, mouth, and throat (upper respiratory tract). Allergies. Irritation from cigarette smoke. Irritation from stomach acid coming up into the esophagus (gastroesophageal reflux). The esophagus is the part of the body that moves food from the mouth to the stomach. Sudden changes in air pressure, such as from descending in an airplane or scuba diving. Abnormal growths in the nose or throat, such as: Growths that line the nose (nasal polyps). Abnormal growth of cells (tumors). Enlarged tissue at the back of the throat (adenoids). What increases the risk? You are more likely to develop this condition if: You smoke. You are overweight. You are a child who has: Certain birth defects of the mouth, such as cleft palate. Large tonsils or adenoids. What are the signs or symptoms? Common symptoms of this condition include: A feeling of fullness in the ear. Ear pain. Clicking or popping noises in the ear. Ringing in the ear (tinnitus). Hearing loss. Loss of balance. Dizziness. Symptoms may get worse when the air pressure around you changes, such as when you travel to an area of high elevation, fly on an airplane, or go scuba diving. How is this diagnosed? This condition may be diagnosed based on: Your symptoms. A physical exam of your ears, nose, and throat. Tests, such as those that measure: The movement of your eardrum. Your hearing (audiometry). How  is this treated? Treatment depends on the cause and severity of your condition. In mild cases, you may relieve your symptoms by moving air into your ears. This is called "popping the ears." In more severe cases, or if you have symptoms of fluid in your  ears, treatment may include: Medicines to relieve congestion (decongestants). Medicines that treat allergies (antihistamines). Nasal sprays or ear drops that contain medicines that reduce swelling (steroids). A procedure to drain the fluid in your eardrum. In this procedure, a small tube may be placed in the eardrum to: Drain the fluid. Restore the air in the middle ear space. A procedure to insert a balloon device through the nose to inflate the opening of the eustachian tube (balloon dilation). Follow these instructions at home: Lifestyle Do not do any of the following until your health care provider approves: Travel to high altitudes. Fly in airplanes. Work in a Estate agent or room. Scuba dive. Do not use any products that contain nicotine or tobacco. These products include cigarettes, chewing tobacco, and vaping devices, such as e-cigarettes. If you need help quitting, ask your health care provider. Keep your ears dry. Wear fitted earplugs during showering and bathing. Dry your ears completely after. General instructions Take over-the-counter and prescription medicines only as told by your health care provider. Use techniques to help pop your ears as recommended by your health care provider. These may include: Chewing gum. Yawning. Frequent, forceful swallowing. Closing your mouth, holding your nose closed, and gently blowing as if you are trying to blow air out of your nose. Keep all follow-up visits. This is important. Contact a health care provider if: Your symptoms do not go away after treatment. Your symptoms come back after treatment. You are unable to pop your ears. You have: A fever. Pain in your ear. Pain in your head or neck. Fluid draining from your ear. Your hearing suddenly changes. You become very dizzy. You lose your balance. Get help right away if: You have a sudden, severe increase in any of your symptoms. Summary Eustachian tube dysfunction refers  to a condition in which a blockage develops in the eustachian tube. It can be caused by ear infections, allergies, inhaled irritants, or abnormal growths in the nose or throat. Symptoms may include ear pain or fullness, hearing loss, or ringing in the ears. Mild cases are treated with techniques to unblock the ears, such as yawning or chewing gum. More severe cases are treated with medicines or procedures. This information is not intended to replace advice given to you by your health care provider. Make sure you discuss any questions you have with your health care provider. Document Revised: 07/23/2020 Document Reviewed: 07/23/2020 Elsevier Patient Education  2024 Elsevier Inc.    If you have been instructed to have an in-person evaluation today at a local Urgent Care facility, please use the link below. It will take you to a list of all of our available [facility], including address, phone number and hours of operation. Please do not delay care.  [facility]  If you or a family member do not have a primary care provider, use the link below to schedule a visit and establish care. When you choose a [facility] primary care physician or advanced practice provider, you gain a long-term partner in health. Find a Primary Care Provider  Learn more about [facility]'s in-office and virtual care options: [facility] - Get Care Now

## 2023-01-13 DIAGNOSIS — H90A12 Conductive hearing loss, unilateral, left ear with restricted hearing on the contralateral side: Secondary | ICD-10-CM | POA: Diagnosis not present

## 2023-01-13 DIAGNOSIS — H6982 Other specified disorders of Eustachian tube, left ear: Secondary | ICD-10-CM | POA: Diagnosis not present

## 2023-01-13 DIAGNOSIS — H9012 Conductive hearing loss, unilateral, left ear, with unrestricted hearing on the contralateral side: Secondary | ICD-10-CM | POA: Diagnosis not present

## 2023-02-27 ENCOUNTER — Other Ambulatory Visit: Payer: Self-pay | Admitting: Family Medicine

## 2023-02-27 DIAGNOSIS — Z30018 Encounter for initial prescription of other contraceptives: Secondary | ICD-10-CM

## 2023-02-27 MED ORDER — DAYSEE 0.15-0.03 &0.01 MG PO TABS
1.0000 | ORAL_TABLET | Freq: Every day | ORAL | 0 refills | Status: DC
Start: 2023-02-27 — End: 2023-06-01

## 2023-03-05 ENCOUNTER — Other Ambulatory Visit: Payer: Self-pay | Admitting: Family Medicine

## 2023-03-05 ENCOUNTER — Other Ambulatory Visit: Payer: Self-pay

## 2023-03-05 DIAGNOSIS — F909 Attention-deficit hyperactivity disorder, unspecified type: Secondary | ICD-10-CM

## 2023-03-11 ENCOUNTER — Other Ambulatory Visit: Payer: Self-pay | Admitting: Family Medicine

## 2023-03-11 DIAGNOSIS — F909 Attention-deficit hyperactivity disorder, unspecified type: Secondary | ICD-10-CM

## 2023-03-12 ENCOUNTER — Other Ambulatory Visit: Payer: Self-pay

## 2023-03-18 ENCOUNTER — Other Ambulatory Visit: Payer: Self-pay

## 2023-05-29 ENCOUNTER — Other Ambulatory Visit: Payer: Self-pay | Admitting: Internal Medicine

## 2023-05-29 DIAGNOSIS — Z30018 Encounter for initial prescription of other contraceptives: Secondary | ICD-10-CM

## 2023-06-01 NOTE — Telephone Encounter (Signed)
 Requested Prescriptions  Pending Prescriptions Disp Refills   DAYSEE  0.15-0.03 &0.01 MG tablet [Pharmacy Med Name: DAYSEE  0.15-0.03-0.01 MG TAB] 91 tablet     Sig: TAKE 1 TABLET BY MOUTH EVERY DAY     OB/GYN:  Contraceptives Passed - 06/01/2023  3:34 PM      Passed - Last BP in normal range    BP Readings from Last 1 Encounters:  10/14/22 114/72         Passed - Valid encounter within last 12 months    Recent Outpatient Visits           7 months ago Migraine without aura and without status migrainosus, not intractable   [facility] Glenard Mire, MD   9 months ago Palpitations   [facility] Gareth Mliss FALCON, FNP   10 months ago Attention deficit hyperactivity disorder (ADHD), unspecified ADHD type   [facility] Sowles, Krichna, MD   1 year ago COVID-19   [facility] Gareth Mliss FALCON, FNP   1 year ago Attention deficit hyperactivity disorder (ADHD), unspecified ADHD type   [facility] Sowles, Krichna, MD              Passed - Patient is not a smoker

## 2023-06-16 ENCOUNTER — Other Ambulatory Visit: Payer: Self-pay | Admitting: Family Medicine

## 2023-06-16 DIAGNOSIS — F909 Attention-deficit hyperactivity disorder, unspecified type: Secondary | ICD-10-CM

## 2023-06-16 NOTE — Telephone Encounter (Signed)
Virtual appt scheduled for 1.22.2025

## 2023-06-16 NOTE — Telephone Encounter (Signed)
Called pt: no answer. LVMTCB.

## 2023-06-16 NOTE — Telephone Encounter (Signed)
Per pt 03/12/23

## 2023-06-17 ENCOUNTER — Telehealth: Payer: BC Managed Care – PPO | Admitting: Family Medicine

## 2023-06-17 ENCOUNTER — Encounter: Payer: Self-pay | Admitting: Family Medicine

## 2023-06-17 DIAGNOSIS — E559 Vitamin D deficiency, unspecified: Secondary | ICD-10-CM

## 2023-06-17 DIAGNOSIS — F909 Attention-deficit hyperactivity disorder, unspecified type: Secondary | ICD-10-CM

## 2023-06-17 DIAGNOSIS — J4520 Mild intermittent asthma, uncomplicated: Secondary | ICD-10-CM

## 2023-06-17 DIAGNOSIS — G43009 Migraine without aura, not intractable, without status migrainosus: Secondary | ICD-10-CM

## 2023-06-17 MED ORDER — AIRSUPRA 90-80 MCG/ACT IN AERO: 2.0000 | INHALATION_SPRAY | Freq: Four times a day (QID) | RESPIRATORY_TRACT | 2 refills | Status: AC | PRN

## 2023-06-17 MED ORDER — AMPHETAMINE-DEXTROAMPHET ER 25 MG PO CP24
25.0000 mg | ORAL_CAPSULE | ORAL | 0 refills | Status: DC
Start: 2023-06-17 — End: 2023-10-26

## 2023-06-17 NOTE — Progress Notes (Signed)
Name: Mary Kramer   MRN: [id number]    DOB: 24-Dec-1985   Date:06/17/2023       Progress Note  Subjective  Chief Complaint  Chief Complaint  Patient presents with   Medication Refill    I connected with  Barkley Boards  on 06/17/23 at  9:00 AM EST by a video enabled telemedicine application and verified that I am speaking with the correct person using two identifiers.  I discussed the limitations of evaluation and management by telemedicine and the availability of in person appointments. The patient expressed understanding and agreed to proceed with the virtual visit  Staff also discussed with the patient that there may be a patient responsible charge related to this service. Patient Location: at home Provider Location: [facility] Additional Individuals present: alone  Discussed the use of AI scribe software for clinical note transcription with the patient, who gave verbal consent to proceed.  History of Present Illness   The patient, with a history of ADHD and generalized anxiety disorder, reports feeling significantly better since discontinuing birth control. She denies any recent anxiety episodes and has not taken the prescribed duloxetine. She continues to manage ADHD with Adderall 25 XR, which she reports as effective throughout the day without any noticeable 'crash' in the evening. She denies any side effects from the medication, including tics or significant weight loss.  The patient also has a history of intermittent asthma, which she reports is currently well-controlled. She notes that symptoms typically flare up during viral illnesses, resulting in coughing and wheezing. She does not currently have any albuterol on hand for these episodes.     Patient Active Problem List   Diagnosis Date Noted   GAD (generalized anxiety disorder) 10/14/2022   S/P cesarean section 06/17/2021   Delivery by emergency cesarean    History of loop electrosurgical excision procedure (LEEP) of  cervix affecting pregnancy, antepartum 12/06/2020   Carrier of genetic disorder 10/31/2020   Asthma, stable, mild intermittent 01/07/2019   Controlled substance agreement signed 04/28/2017   Insomnia 08/10/2015   Vitamin D deficiency 08/10/2015   Migraine without aura 05/11/2015   ADHD (attention deficit hyperactivity disorder) 02/09/2015   Lump or mass in breast 11/15/2012    Past Surgical History:  Procedure Laterality Date   CERVICAL BIOPSY  W/ LOOP ELECTRODE EXCISION     CESAREAN SECTION  06/09/2021   Procedure: CESAREAN SECTION;  Surgeon: Vena Austria, MD;  Location: [facility] ORS;  Service: Obstetrics;;   DG HYSTEROGRAM (HSG)  07/30/2017        Family History  Problem Relation Age of Onset   Anxiety disorder Mother    Migraines Mother    Hyperlipidemia Father    Transient ischemic attack Father    Hypertension Father    Stroke Father    Lung disease Father    Atrial fibrillation Maternal Grandmother    Cancer Paternal Grandmother        leukemia   Diabetes Paternal Grandfather    Cerebral aneurysm Paternal Aunt    Cerebral aneurysm Maternal Grandfather     Social History   Socioeconomic History   Marital status: Single    Spouse name: Not on file   Number of children: Not on file   Years of education: Not on file   Highest education level: Not on file  Occupational History   Not on file  Tobacco Use   Smoking status: Never   Smokeless tobacco: Never  Vaping Use   Vaping status:  Never Used  Substance and Sexual Activity   Alcohol use: Not Currently    Comment: rarely   Drug use: No   Sexual activity: Yes    Birth control/protection: None  Other Topics Concern   Not on file  Social History Narrative   Not on file   Social Drivers of Health   Financial Resource Strain: Not on file  Food Insecurity: Not on file  Transportation Needs: Not on file  Physical Activity: Insufficiently Active (07/01/2017)   Exercise Vital Sign    Days of Exercise per Week:  1 day    Minutes of Exercise per Session: 30 min  Stress: No Stress Concern Present (07/01/2017)   [employer] of Occupational Health - Occupational Stress Questionnaire    Feeling of Stress : Not at all  Social Connections: Moderately Integrated (07/01/2017)   Social Connection and Isolation Panel [NHANES]    Frequency of Communication with Friends and Family: More than three times a week    Frequency of Social Gatherings with Friends and Family: More than three times a week    Attends Religious Services: More than 4 times per year    Active Member of [employer] or Organizations: No    Attends Banker Meetings: Never    Marital Status: Married  Catering manager Violence: Not At Risk (07/01/2017)   Humiliation, Afraid, Rape, and Kick questionnaire    Fear of Current or Ex-Partner: No    Emotionally Abused: No    Physically Abused: No    Sexually Abused: No     Current Outpatient Medications:    amphetamine-dextroamphetamine (ADDERALL XR) 25 MG 24 hr capsule, Take 1 capsule by mouth every morning., Disp: 90 capsule, Rfl: 0   amphetamine-dextroamphetamine (ADDERALL XR) 25 MG 24 hr capsule, Take 1 capsule by mouth every morning., Disp: 90 capsule, Rfl: 0   DAYSEE 0.15-0.03 &0.01 MG tablet, TAKE 1 TABLET BY MOUTH EVERY DAY, Disp: 90 tablet, Rfl: 0   fluticasone (FLONASE) 50 MCG/ACT nasal spray, Place 2 sprays into both nostrils daily., Disp: 16 g, Rfl: 0  Allergies  Allergen Reactions   Lexapro [Escitalopram Oxalate] Nausea Only   Hydrocodone Rash   Vicodin [Hydrocodone-Acetaminophen] Rash    I personally reviewed active problem list, medication list, allergies, family history with the patient/caregiver today.   ROS  Ten systems reviewed and is negative except as mentioned in HPI    Objective  Virtual encounter, vitals not obtained.  There is no height or weight on file to calculate BMI.  Physical Exam  Awake, alert and oriented  PHQ2/9:    06/17/2023    8:10  AM 10/14/2022   11:38 AM 08/08/2022    2:16 PM 07/16/2022   11:21 AM 05/07/2022    1:26 PM  Depression screen PHQ 2/9  Decreased Interest 0 0 0 0 0  Down, Depressed, Hopeless 0 0 0 0 0  PHQ - 2 Score 0 0 0 0 0  Altered sleeping 0 1 0 2   Tired, decreased energy 0 1 0 1   Change in appetite 0 0 0 0   Feeling bad or failure about yourself  0 0 0 0   Trouble concentrating 0 0 0 0   Moving slowly or fidgety/restless 0 0 0 0   Suicidal thoughts 0 0 0 0   PHQ-9 Score 0 2 0 3   Difficult doing work/chores  Not difficult at all Not difficult at all Not difficult at all    PHQ-2/9  Result is negative.    Fall Risk:    10/14/2022   11:33 AM 08/08/2022    2:15 PM 07/16/2022   11:19 AM 05/07/2022    1:25 PM 02/12/2022    1:20 PM  Fall Risk   Falls in the past year? 0 0 0 0 0  Number falls in past yr:  0  0 0  Injury with Fall?  0  0 0  Risk for fall due to : No Fall Risks  No Fall Risks  No Fall Risks  Follow up Falls prevention discussed  Falls prevention discussed Falls evaluation completed Falls prevention discussed     Assessment and Plan    ADHD Stable on Adderall XR 25mg  daily. No reported side effects or tics. Medication effective but wanes in the evening. -Continue Adderall XR 25mg  daily. -Refill prescription for 90 days.  Generalized Anxiety Disorder Improvement noted after discontinuation of birth control. No current use of anxiolytic medication. Patient declined prescription for hydroxyzine. -Monitor anxiety symptoms.  Asthma Mild intermittent symptoms, typically triggered by viral illnesses. No current albuterol on hand. -Consider Airsupra for use during symptomatic periods, particularly during viral illnesses. Patient to research and request prescription if desired.  Contraception Discontinued oral contraceptive pills. Currently using condoms. -Continue current method of contraception. Consider tracking ovulation in addition to condom use for added protection.       I discussed the assessment and treatment plan with the patient. The patient was provided an opportunity to ask questions and all were answered. The patient agreed with the plan and demonstrated an understanding of the instructions.  The patient was advised to call back or seek an in-person evaluation if the symptoms worsen or if the condition fails to improve as anticipated.  I provided 25  minutes of non-face-to-face time during this encounter.

## 2023-06-26 ENCOUNTER — Telehealth: Payer: BC Managed Care – PPO | Admitting: Family Medicine

## 2023-06-26 DIAGNOSIS — J101 Influenza due to other identified influenza virus with other respiratory manifestations: Secondary | ICD-10-CM

## 2023-06-26 MED ORDER — OSELTAMIVIR PHOSPHATE 75 MG PO CAPS: 75.0000 mg | ORAL_CAPSULE | Freq: Two times a day (BID) | ORAL | 0 refills | Status: AC

## 2023-06-26 NOTE — Patient Instructions (Signed)

## 2023-06-26 NOTE — Progress Notes (Signed)
Virtual Visit Consent   Mary Kramer, you are scheduled for a virtual visit with a [place] provider today. Just as with appointments in the office, your consent must be obtained to participate. Your consent will be active for this visit and any virtual visit you may have with one of our providers in the next 365 days. If you have a MyChart account, a copy of this consent can be sent to you electronically.  As this is a virtual visit, video technology does not allow for your provider to perform a traditional examination. This may limit your provider's ability to fully assess your condition. If your provider identifies any concerns that need to be evaluated in person or the need to arrange testing (such as labs, EKG, etc.), we will make arrangements to do so. Although advances in technology are sophisticated, we cannot ensure that it will always work on either your end or our end. If the connection with a video visit is poor, the visit may have to be switched to a telephone visit. With either a video or telephone visit, we are not always able to ensure that we have a secure connection.  By engaging in this virtual visit, you consent to the provision of healthcare and authorize for your insurance to be billed (if applicable) for the services provided during this visit. Depending on your insurance coverage, you may receive a charge related to this service.  I need to obtain your verbal consent now. Are you willing to proceed with your visit today? Mary Kramer has provided verbal consent on 06/26/2023 for a virtual visit (video or telephone). Mary Curio, FNP  Date: 06/26/2023 7:32 PM  Virtual Visit via Video Note   I, Mary Kramer, connected with  Mary Kramer  ([id number], 05-03-1986) on 06/26/23 at  7:45 PM EST by a video-enabled telemedicine application and verified that I am speaking with the correct person using two identifiers.  Location: Patient: Virtual Visit Location  Patient: Home Provider: Virtual Visit Location Provider: Home Office   I discussed the limitations of evaluation and management by telemedicine and the availability of in person appointments. The patient expressed understanding and agreed to proceed.    History of Present Illness: Mary Kramer is a 38 y.o. who identifies as a female who was assigned female at birth, and is being seen today for influenza a positive test at home with fever, chills, body aches and cough. She requests tamiflu. Sx started today. Marland Kitchen  HPI: HPI  Problems:  Patient Active Problem List   Diagnosis Date Noted   GAD (generalized anxiety disorder) 10/14/2022   S/P cesarean section 06/17/2021   Delivery by emergency cesarean    History of loop electrosurgical excision procedure (LEEP) of cervix affecting pregnancy, antepartum 12/06/2020   Carrier of genetic disorder 10/31/2020   Asthma, stable, mild intermittent 01/07/2019   Controlled substance agreement signed 04/28/2017   Insomnia 08/10/2015   Vitamin D deficiency 08/10/2015   Migraine without aura 05/11/2015   ADHD (attention deficit hyperactivity disorder) 02/09/2015   Lump or mass in breast 11/15/2012    Allergies:  Allergies  Allergen Reactions   Lexapro [Escitalopram Oxalate] Nausea Only   Hydrocodone Rash   Vicodin [Hydrocodone-Acetaminophen] Rash   Medications:  Current Outpatient Medications:    Albuterol-Budesonide (AIRSUPRA) 90-80 MCG/ACT AERO, Inhale 2 puffs into the lungs 4 (four) times daily as needed., Disp: 10.7 g, Rfl: 2   amphetamine-dextroamphetamine (ADDERALL XR) 25 MG 24 hr capsule, Take 1 capsule by  mouth every morning., Disp: 90 capsule, Rfl: 0   fluticasone (FLONASE) 50 MCG/ACT nasal spray, Place 2 sprays into both nostrils daily., Disp: 16 g, Rfl: 0   oseltamivir (TAMIFLU) 75 MG capsule, Take 1 capsule (75 mg total) by mouth 2 (two) times daily for 5 days., Disp: 10 capsule, Rfl: 0  Observations/Objective: Patient is  well-developed, well-nourished in no acute distress.  Resting comfortably  at home.  Head is normocephalic, atraumatic.  No labored breathing.  Speech is clear and coherent with logical content.  Patient is alert and oriented at baseline.    Assessment and Plan: 1. Influenza A (Primary)  Increase fuids, humidifier at night, tylenol or ibuprofen as directed, UC if sx worsen.   Follow Up Instructions: I discussed the assessment and treatment plan with the patient. The patient was provided an opportunity to ask questions and all were answered. The patient agreed with the plan and demonstrated an understanding of the instructions.  A copy of instructions were sent to the patient via MyChart unless otherwise noted below.     The patient was advised to call back or seek an in-person evaluation if the symptoms worsen or if the condition fails to improve as anticipated.    Mary Curio, FNP

## 2023-07-01 ENCOUNTER — Telehealth: Payer: BC Managed Care – PPO | Admitting: Physician Assistant

## 2023-07-01 DIAGNOSIS — J208 Acute bronchitis due to other specified organisms: Secondary | ICD-10-CM

## 2023-07-01 DIAGNOSIS — J4521 Mild intermittent asthma with (acute) exacerbation: Secondary | ICD-10-CM | POA: Diagnosis not present

## 2023-07-01 MED ORDER — AZITHROMYCIN 250 MG PO TABS: ORAL_TABLET | ORAL | 0 refills | Status: AC

## 2023-07-01 MED ORDER — PREDNISONE 20 MG PO TABS
40.0000 mg | ORAL_TABLET | Freq: Every day | ORAL | 0 refills | Status: DC
Start: 2023-07-01 — End: 2023-10-26

## 2023-07-01 MED ORDER — BENZONATATE 100 MG PO CAPS
100.0000 mg | ORAL_CAPSULE | Freq: Three times a day (TID) | ORAL | 0 refills | Status: DC | PRN
Start: 2023-07-01 — End: 2023-10-26

## 2023-07-01 NOTE — Progress Notes (Signed)
 I have spent 5 minutes in review of e-visit questionnaire, review and updating patient chart, medical decision making and response to patient.   [name], PA-C

## 2023-07-01 NOTE — Addendum Note (Signed)
Addended by: Margaretann Loveless on: 07/01/2023 01:03 PM   Modules accepted: Orders

## 2023-07-01 NOTE — Progress Notes (Signed)
 E-Visit for Cough   We are sorry that you are not feeling well.  Here is how we plan to help!  Based on your presentation I believe you most likely have viral bronchitis secondary to the flu and is best treated by rest, plenty of fluids and control of the cough.  You may use Ibuprofen  or Tylenol  as directed to help your symptoms.     I am adding on  a short course of prednisone  along with a prescription cough medication. Please continue use of your albuterol  inhaler.   From your responses in the eVisit questionnaire you describe inflammation in the upper respiratory tract which is causing a significant cough.  This is commonly called Bronchitis and has four common causes:   Allergies Viral Infections Acid Reflux Bacterial Infection Allergies, viruses and acid reflux are treated by controlling symptoms or eliminating the cause. An example might be a cough caused by taking certain blood pressure medications. You stop the cough by changing the medication. Another example might be a cough caused by acid reflux. Controlling the reflux helps control the cough.  USE OF BRONCHODILATOR (RESCUE) INHALERS: There is a risk from using your bronchodilator too frequently.  The risk is that over-reliance on a medication which only relaxes the muscles surrounding the breathing tubes can reduce the effectiveness of medications prescribed to reduce swelling and congestion of the tubes themselves.  Although you feel brief relief from the bronchodilator inhaler, your asthma may actually be worsening with the tubes becoming more swollen and filled with mucus.  This can delay other crucial treatments, such as oral steroid medications. If you need to use a bronchodilator inhaler daily, several times per day, you should discuss this with your provider.  There are probably better treatments that could be used to keep your asthma under control.     HOME CARE Only take medications as instructed by your medical  team. Complete the entire course of an antibiotic. Drink plenty of fluids and get plenty of rest. Avoid close contacts especially the very young and the elderly Cover your mouth if you cough or cough into your sleeve. Always remember to wash your hands A steam or ultrasonic humidifier can help congestion.   GET HELP RIGHT AWAY IF: You develop worsening fever. You become short of breath You cough up blood. Your symptoms persist after you have completed your treatment plan MAKE SURE YOU  Understand these instructions. Will watch your condition. Will get help right away if you are not doing well or get worse.    Thank you for choosing an e-visit.  Your e-visit answers were reviewed by a board certified advanced clinical practitioner to complete your personal care plan. Depending upon the condition, your plan could have included both over the counter or prescription medications.  Please review your pharmacy choice. Make sure the pharmacy is open so you can pick up prescription now. If there is a problem, you may contact your provider through [employer] and have the prescription routed to another pharmacy.  Your safety is important to us . If you have drug allergies check your prescription carefully.   For the next 24 hours you can use MyChart to ask questions about today's visit, request a non-urgent call back, or ask for a work or school excuse. You will get an email in the next two days asking about your experience. I hope that your e-visit has been valuable and will speed your recovery.

## 2023-07-21 ENCOUNTER — Ambulatory Visit: Payer: Self-pay | Admitting: Family Medicine

## 2023-09-23 ENCOUNTER — Other Ambulatory Visit: Payer: Self-pay | Admitting: Family Medicine

## 2023-09-23 DIAGNOSIS — Z30018 Encounter for initial prescription of other contraceptives: Secondary | ICD-10-CM

## 2023-10-16 ENCOUNTER — Encounter: Payer: Self-pay | Admitting: Family Medicine

## 2023-10-26 ENCOUNTER — Telehealth (INDEPENDENT_AMBULATORY_CARE_PROVIDER_SITE_OTHER): Admitting: Family Medicine

## 2023-10-26 ENCOUNTER — Encounter: Payer: Self-pay | Admitting: Family Medicine

## 2023-10-26 DIAGNOSIS — F419 Anxiety disorder, unspecified: Secondary | ICD-10-CM | POA: Diagnosis not present

## 2023-10-26 DIAGNOSIS — F909 Attention-deficit hyperactivity disorder, unspecified type: Secondary | ICD-10-CM | POA: Diagnosis not present

## 2023-10-26 DIAGNOSIS — J4520 Mild intermittent asthma, uncomplicated: Secondary | ICD-10-CM | POA: Diagnosis not present

## 2023-10-26 DIAGNOSIS — E559 Vitamin D deficiency, unspecified: Secondary | ICD-10-CM

## 2023-10-26 DIAGNOSIS — G43009 Migraine without aura, not intractable, without status migrainosus: Secondary | ICD-10-CM

## 2023-10-26 DIAGNOSIS — F411 Generalized anxiety disorder: Secondary | ICD-10-CM

## 2023-10-26 DIAGNOSIS — Z8639 Personal history of other endocrine, nutritional and metabolic disease: Secondary | ICD-10-CM

## 2023-10-26 MED ORDER — AMPHETAMINE-DEXTROAMPHETAMINE 10 MG PO TABS: 10.0000 mg | ORAL_TABLET | Freq: Every day | ORAL | 0 refills | Status: DC | PRN

## 2023-10-26 MED ORDER — AMPHETAMINE-DEXTROAMPHET ER 25 MG PO CP24: 25.0000 mg | ORAL_CAPSULE | ORAL | 0 refills | Status: DC

## 2023-10-26 NOTE — Progress Notes (Signed)
 Name: Meko Bellanger   MRN: [id number]    DOB: 10-15-1985   Date:10/26/2023       Progress Note  Subjective  Chief Complaint  Chief Complaint  Patient presents with   Medical Management of Chronic Issues    I connected with  Pieter Bride  on 10/26/23 at  1:40 PM EDT by a video enabled telemedicine application and verified that I am speaking with the correct person using two identifiers.  I discussed the limitations of evaluation and management by telemedicine and the availability of in person appointments. The patient expressed understanding and agreed to proceed with the virtual visit  Staff also discussed with the patient that there may be a patient responsible charge related to this service. Patient Location: at work  Provider Location: [facility] Additional Individuals present: alone  Discussed the use of AI scribe software for clinical note transcription with the patient, who gave verbal consent to proceed.  History of Present Illness Burnette Valenti is a 38 year old female who presents for follow-up of ADHD and anxiety management.  She manages ADHD with Adderall XR 25 mg once daily, which is effective for most of the day but less so by mid-afternoon. She has not tried other medications like Vyvanse  due to adverse effects such as sleepiness and mood changes. She avoids it on weekends to prevent sleep disturbances.  Her anxiety has improved, particularly since her child is now two and a [age] years old. She has not taken specific medication for anxiety and manages symptoms with lifestyle adjustments. No significant anxiety symptoms are currently present.  She had a history of viral bronchitis diagnosed during a home e-visit, which resolved after treatment with a Z-Pak and prednisone . Initially, she experienced flu-like symptoms, including shortness of breath and sharp pain. No current symptoms of cough, wheezing, or other respiratory issues are present, and she is not using her  Airsupra  inhaler.  She sometimes takes vitamin D supplementation due to a history of deficiency, but not consistently.  She reports a significant decrease in migraine frequency since having her baby, managing occasional headaches with over-the-counter medications. Her last pregnancy was full-term at 38-39 weeks, and she is not currently trying to conceive.    Patient Active Problem List   Diagnosis Date Noted   GAD (generalized anxiety disorder) 10/14/2022   S/P cesarean section 06/17/2021   Delivery by emergency cesarean    History of loop electrosurgical excision procedure (LEEP) of cervix affecting pregnancy, antepartum 12/06/2020   Carrier of genetic disorder 10/31/2020   Asthma, stable, mild intermittent 01/07/2019   Controlled substance agreement signed 04/28/2017   Insomnia 08/10/2015   Vitamin D deficiency 08/10/2015   Migraine without aura 05/11/2015   ADHD (attention deficit hyperactivity disorder) 02/09/2015   Lump or mass in breast 11/15/2012    Past Surgical History:  Procedure Laterality Date   CERVICAL BIOPSY  W/ LOOP ELECTRODE EXCISION     CESAREAN SECTION  06/09/2021   Procedure: CESAREAN SECTION;  Surgeon: Darl Edu, MD;  Location: [facility] ORS;  Service: Obstetrics;;   DG HYSTEROGRAM (HSG)  07/30/2017        Family History  Problem Relation Age of Onset   Anxiety disorder Mother    Migraines Mother    Hyperlipidemia Father    Transient ischemic attack Father    Hypertension Father    Stroke Father    Lung disease Father    Atrial fibrillation Maternal Grandmother    Cancer Paternal Grandmother  leukemia   Diabetes Paternal Grandfather    Cerebral aneurysm Paternal Aunt    Cerebral aneurysm Maternal Grandfather     Social History   Socioeconomic History   Marital status: Single    Spouse name: Not on file   Number of children: Not on file   Years of education: Not on file   Highest education level: Not on file  Occupational History    Not on file  Tobacco Use   Smoking status: Never   Smokeless tobacco: Never  Vaping Use   Vaping status: Never Used  Substance and Sexual Activity   Alcohol use: Not Currently    Comment: rarely   Drug use: No   Sexual activity: Yes    Birth control/protection: None  Other Topics Concern   Not on file  Social History Narrative   Not on file   Social Drivers of Health   Financial Resource Strain: Not on file  Food Insecurity: Not on file  Transportation Needs: Not on file  Physical Activity: Insufficiently Active (07/01/2017)   Exercise Vital Sign    Days of Exercise per Week: 1 day    Minutes of Exercise per Session: 30 min  Stress: No Stress Concern Present (07/01/2017)   [employer] of Occupational Health - Occupational Stress Questionnaire    Feeling of Stress : Not at all  Social Connections: Moderately Integrated (07/01/2017)   Social Connection and Isolation Panel [NHANES]    Frequency of Communication with Friends and Family: More than three times a week    Frequency of Social Gatherings with Friends and Family: More than three times a week    Attends Religious Services: More than 4 times per year    Active Member of [employer] or Organizations: No    Attends Banker Meetings: Never    Marital Status: Married  Catering manager Violence: Not At Risk (07/01/2017)   Humiliation, Afraid, Rape, and Kick questionnaire    Fear of Current or Ex-Partner: No    Emotionally Abused: No    Physically Abused: No    Sexually Abused: No     Current Outpatient Medications:    Albuterol -Budesonide (AIRSUPRA ) 90-80 MCG/ACT AERO, Inhale 2 puffs into the lungs 4 (four) times daily as needed., Disp: 10.7 g, Rfl: 2   amphetamine -dextroamphetamine  (ADDERALL XR) 25 MG 24 hr capsule, Take 1 capsule by mouth every morning., Disp: 90 capsule, Rfl: 0   fluticasone  (FLONASE ) 50 MCG/ACT nasal spray, Place 2 sprays into both nostrils daily., Disp: 16 g, Rfl: 0   benzonatate  (TESSALON )  100 MG capsule, Take 1 capsule (100 mg total) by mouth 3 (three) times daily as needed for cough. (Patient not taking: Reported on 10/26/2023), Disp: 30 capsule, Rfl: 0   predniSONE  (DELTASONE ) 20 MG tablet, Take 2 tablets (40 mg total) by mouth daily with breakfast. (Patient not taking: Reported on 10/26/2023), Disp: 10 tablet, Rfl: 0  Allergies  Allergen Reactions   Lexapro [Escitalopram Oxalate] Nausea Only   Hydrocodone Rash   Vicodin [Hydrocodone-Acetaminophen ] Rash    I personally reviewed active problem list, medication list, allergies with the patient/caregiver today.   ROS  Ten systems reviewed and is negative except as mentioned in HPI    Objective  Virtual encounter, vitals not obtained.  There is no height or weight on file to calculate BMI.  Physical Exam  Awake, alert and oriented   PHQ2/9:    10/26/2023    9:36 AM 06/17/2023    8:10 AM 10/14/2022  11:38 AM 08/08/2022    2:16 PM 07/16/2022   11:21 AM  Depression screen PHQ 2/9  Decreased Interest 0 0 0 0 0  Down, Depressed, Hopeless 0 0 0 0 0  PHQ - 2 Score 0 0 0 0 0  Altered sleeping 0 0 1 0 2  Tired, decreased energy 0 0 1 0 1  Change in appetite 0 0 0 0 0  Feeling bad or failure about yourself  0 0 0 0 0  Trouble concentrating 0 0 0 0 0  Moving slowly or fidgety/restless 0 0 0 0 0  Suicidal thoughts 0 0 0 0 0  PHQ-9 Score 0 0 2 0 3  Difficult doing work/chores Not difficult at all  Not difficult at all Not difficult at all Not difficult at all   PHQ-2/9 Result is negative.    Fall Risk:    10/26/2023    9:36 AM 10/14/2022   11:33 AM 08/08/2022    2:15 PM 07/16/2022   11:19 AM 05/07/2022    1:25 PM  Fall Risk   Falls in the past year? 0 0 0 0 0  Number falls in past yr: 0  0  0  Injury with Fall? 0  0  0  Risk for fall due to : No Fall Risks No Fall Risks  No Fall Risks   Follow up Falls prevention discussed;Education provided;Falls evaluation completed Falls prevention discussed  Falls prevention  discussed Falls evaluation completed     Assessment & Plan ADHD Managed with Adderall XR 25 mg daily. Medication effective until mid-afternoon. Discussed option of immediate release formulation for late-day coverage. - Continue Adderall XR 25 mg daily. - Prescribe Adderall immediate release 10 mg PRN for late-day use. - Advise hydration and avoiding excessive caffeine intake.  Anxiety Symptoms improved without medication, likely due to better sleep and well-being.  Asthma, mild intermittent Managed with Airsupra  as needed. Patient reports mild intermittent symptoms.  General Health Maintenance Blood work not done since last year. Last B12 check in 2021. - Plan for blood work including lipid panel, CBC, and comprehensive panel during next in-person visit.     I discussed the assessment and treatment plan with the patient. The patient was provided an opportunity to ask questions and all were answered. The patient agreed with the plan and demonstrated an understanding of the instructions.  The patient was advised to call back or seek an in-person evaluation if the symptoms worsen or if the condition fails to improve as anticipated.  I provided 25 minutes of non-face-to-face time during this encounter.

## 2024-02-16 ENCOUNTER — Encounter: Payer: Self-pay | Admitting: Family Medicine

## 2024-02-16 ENCOUNTER — Ambulatory Visit: Admitting: Family Medicine

## 2024-02-16 ENCOUNTER — Telehealth (INDEPENDENT_AMBULATORY_CARE_PROVIDER_SITE_OTHER): Admitting: Family Medicine

## 2024-02-16 DIAGNOSIS — J4520 Mild intermittent asthma, uncomplicated: Secondary | ICD-10-CM | POA: Diagnosis not present

## 2024-02-16 DIAGNOSIS — F909 Attention-deficit hyperactivity disorder, unspecified type: Secondary | ICD-10-CM | POA: Diagnosis not present

## 2024-02-16 DIAGNOSIS — E559 Vitamin D deficiency, unspecified: Secondary | ICD-10-CM

## 2024-02-16 DIAGNOSIS — G43009 Migraine without aura, not intractable, without status migrainosus: Secondary | ICD-10-CM

## 2024-02-16 MED ORDER — RIZATRIPTAN BENZOATE 10 MG PO TABS: 10.0000 mg | ORAL_TABLET | ORAL | 0 refills | Status: AC | PRN

## 2024-02-16 MED ORDER — AMPHETAMINE-DEXTROAMPHETAMINE 10 MG PO TABS: 10.0000 mg | ORAL_TABLET | Freq: Every day | ORAL | 0 refills | Status: DC | PRN

## 2024-02-16 MED ORDER — AMPHETAMINE-DEXTROAMPHET ER 25 MG PO CP24: 25.0000 mg | ORAL_CAPSULE | ORAL | 0 refills | Status: DC

## 2024-02-16 NOTE — Progress Notes (Signed)
 Name: Mary Kramer   MRN: [id number]    DOB: 09-17-1985   Date:02/16/2024       Progress Note  Subjective  Chief Complaint  Chief Complaint  Patient presents with   Medical Management of Chronic Issues    I connected with  Mary Kramer  on 02/16/24 at  8:20 AM EDT by a video enabled telemedicine application and verified that I am speaking with the correct person using two identifiers.  I discussed the limitations of evaluation and management by telemedicine and the availability of in person appointments. The patient expressed understanding and agreed to proceed with the virtual visit  Staff also discussed with the patient that there may be a patient responsible charge related to this service. Patient Location: at home  Provider Location: [facility] Additional Individuals present:  son  Discussed the use of AI scribe software for clinical note transcription with the patient, who gave verbal consent to proceed.  History of Present Illness Mary Kramer is a 38 year old female with ADHD and generalized anxiety disorder who presents for a six-month follow-up.  She has a long history of ADHD and generalized anxiety disorder. She feels better after discontinuing birth control in January, attributing this to a reduction in anxiety symptoms. She has never taken duloxetine  as she was not experiencing anxiety attacks.  She is currently taking Adderall 25 mg XR for ADHD and experiences occasional 'crashing' at the end of the day. She uses short-acting Adderall on an as-needed basis, which she finds helpful, and skips taking Adderall on weekends to extend her supply.  She has mild intermittent asthma and uses albuterol , especially during colds. She has Airsupra  but prefers using her old albuterol  inhaler. She uses a nasal spray only when experiencing significant congestion.  She has experienced two migraines recently, attributed to lack of sleep and not eating. She manages migraines  with Motrin  and has Maxalt  available for more severe episodes, preferring the swallowable form.  She is taking a vitamin D supplement regularly.    Patient Active Problem List   Diagnosis Date Noted   GAD (generalized anxiety disorder) 10/14/2022   S/P cesarean section 06/17/2021   Delivery by emergency cesarean    History of loop electrosurgical excision procedure (LEEP) of cervix affecting pregnancy, antepartum 12/06/2020   Carrier of genetic disorder 10/31/2020   Asthma, stable, mild intermittent 01/07/2019   Controlled substance agreement signed 04/28/2017   Insomnia 08/10/2015   Vitamin D deficiency 08/10/2015   Migraine without aura 05/11/2015   ADHD (attention deficit hyperactivity disorder) 02/09/2015   Lump or mass in breast 11/15/2012    Past Surgical History:  Procedure Laterality Date   CERVICAL BIOPSY  W/ LOOP ELECTRODE EXCISION     CESAREAN SECTION  06/09/2021   Procedure: CESAREAN SECTION;  Surgeon: Lake Read, MD;  Location: [facility] ORS;  Service: Obstetrics;;   DG HYSTEROGRAM (HSG)  07/30/2017        Family History  Problem Relation Age of Onset   Anxiety disorder Mother    Migraines Mother    Hyperlipidemia Father    Transient ischemic attack Father    Hypertension Father    Stroke Father    Lung disease Father    Atrial fibrillation Maternal Grandmother    Cancer Paternal Grandmother        leukemia   Diabetes Paternal Grandfather    Cerebral aneurysm Paternal Aunt    Cerebral aneurysm Maternal Grandfather     Social History  Socioeconomic History   Marital status: Single    Spouse name: Not on file   Number of children: Not on file   Years of education: Not on file   Highest education level: Not on file  Occupational History   Not on file  Tobacco Use   Smoking status: Never   Smokeless tobacco: Never  Vaping Use   Vaping status: Never Used  Substance and Sexual Activity   Alcohol use: Not Currently    Comment: rarely   Drug use:  No   Sexual activity: Yes    Birth control/protection: None  Other Topics Concern   Not on file  Social History Narrative   Not on file   Social Drivers of Health   Financial Resource Strain: Not on file  Food Insecurity: Not on file  Transportation Needs: Not on file  Physical Activity: Insufficiently Active (07/01/2017)   Exercise Vital Sign    Days of Exercise per Week: 1 day    Minutes of Exercise per Session: 30 min  Stress: No Stress Concern Present (07/01/2017)   [employer] of Occupational Health - Occupational Stress Questionnaire    Feeling of Stress : Not at all  Social Connections: Moderately Integrated (07/01/2017)   Social Connection and Isolation Panel    Frequency of Communication with Friends and Family: More than three times a week    Frequency of Social Gatherings with Friends and Family: More than three times a week    Attends Religious Services: More than 4 times per year    Active Member of [employer] or Organizations: No    Attends Banker Meetings: Never    Marital Status: Married  Catering manager Violence: Not At Risk (07/01/2017)   Humiliation, Afraid, Rape, and Kick questionnaire    Fear of Current or Ex-Partner: No    Emotionally Abused: No    Physically Abused: No    Sexually Abused: No     Current Outpatient Medications:    Albuterol -Budesonide (AIRSUPRA ) 90-80 MCG/ACT AERO, Inhale 2 puffs into the lungs 4 (four) times daily as needed., Disp: 10.7 g, Rfl: 2   amphetamine -dextroamphetamine  (ADDERALL XR) 25 MG 24 hr capsule, Take 1 capsule by mouth every morning., Disp: 90 capsule, Rfl: 0   amphetamine -dextroamphetamine  (ADDERALL) 10 MG tablet, Take 1 tablet (10 mg total) by mouth daily as needed., Disp: 30 tablet, Rfl: 0   fluticasone  (FLONASE ) 50 MCG/ACT nasal spray, Place 2 sprays into both nostrils daily., Disp: 16 g, Rfl: 0  Allergies  Allergen Reactions   Lexapro [Escitalopram Oxalate] Nausea Only   Hydrocodone Rash   Vicodin  [Hydrocodone-Acetaminophen ] Rash    I personally reviewed active problem list, medication list, allergies with the patient/caregiver today.   ROS  Ten systems reviewed and is negative except as mentioned in HPI    Objective  Virtual encounter, vitals not obtained.  There is no height or weight on file to calculate BMI.    Physical Exam Awake alert and oriented   PHQ2/9:    02/16/2024    7:57 AM 10/26/2023    9:36 AM 06/17/2023    8:10 AM 10/14/2022   11:38 AM 08/08/2022    2:16 PM  Depression screen PHQ 2/9  Decreased Interest 0 0 0 0 0  Down, Depressed, Hopeless 0 0 0 0 0  PHQ - 2 Score 0 0 0 0 0  Altered sleeping  0 0 1 0  Tired, decreased energy  0 0 1 0  Change in appetite  0 0 0 0  Feeling bad or failure about yourself   0 0 0 0  Trouble concentrating  0 0 0 0  Moving slowly or fidgety/restless  0 0 0 0  Suicidal thoughts  0 0 0 0  PHQ-9 Score  0 0 2 0  Difficult doing work/chores  Not difficult at all  Not difficult at all Not difficult at all   PHQ-2/9 Result is negative.    Fall Risk:    02/16/2024    7:57 AM 10/26/2023    9:36 AM 10/14/2022   11:33 AM 08/08/2022    2:15 PM 07/16/2022   11:19 AM  Fall Risk   Falls in the past year? 0 0 0 0 0  Number falls in past yr: 0 0  0   Injury with Fall? 0 0  0   Risk for fall due to : No Fall Risks No Fall Risks No Fall Risks  No Fall Risks  Follow up Falls evaluation completed Falls prevention discussed;Education provided;Falls evaluation completed Falls prevention discussed  Falls prevention discussed   Assessment & Plan Attention-deficit hyperactivity disorder, predominantly inattentive type Long-standing ADHD managed with Adderall 25 mg XR. Occasional short-acting formulation beneficial. Medication skipped on weekends, extending prescription duration. - Continue Adderall 25 mg XR. - Prescribe 10 mg immediate release Adderall for occasional use.  Generalized anxiety disorder Symptoms improved after  discontinuing birth control. No current use of duloxetine  as anxiety attacks are not present.  Mild intermittent asthma, uncomplicated Albuterol  used during recent cold. Airsupra  available but not recently used. - Encourage use of Airsupra  for inflammation control.  Migraine, not intractable, without status migrainosus Occasional migraines likely triggered by lack of sleep and skipping meals. Maxalt  effective when used. - Prescribe Maxalt  tablets for migraine management.    I discussed the assessment and treatment plan with the patient. The patient was provided an opportunity to ask questions and all were answered. The patient agreed with the plan and demonstrated an understanding of the instructions.  The patient was advised to call back or seek an in-person evaluation if the symptoms worsen or if the condition fails to improve as anticipated.  I provided 25  minutes of non-face-to-face time during this encounter.

## 2024-03-25 ENCOUNTER — Telehealth: Admitting: Physician Assistant

## 2024-03-25 DIAGNOSIS — R519 Headache, unspecified: Secondary | ICD-10-CM | POA: Diagnosis not present

## 2024-03-25 DIAGNOSIS — R202 Paresthesia of skin: Secondary | ICD-10-CM

## 2024-03-25 DIAGNOSIS — F909 Attention-deficit hyperactivity disorder, unspecified type: Secondary | ICD-10-CM | POA: Diagnosis not present

## 2024-03-25 DIAGNOSIS — F1590 Other stimulant use, unspecified, uncomplicated: Secondary | ICD-10-CM | POA: Diagnosis not present

## 2024-03-25 DIAGNOSIS — Z885 Allergy status to narcotic agent status: Secondary | ICD-10-CM | POA: Diagnosis not present

## 2024-03-25 DIAGNOSIS — R509 Fever, unspecified: Secondary | ICD-10-CM | POA: Diagnosis not present

## 2024-03-25 DIAGNOSIS — G43809 Other migraine, not intractable, without status migrainosus: Secondary | ICD-10-CM | POA: Diagnosis not present

## 2024-03-25 NOTE — Progress Notes (Signed)
 Virtual Visit Consent   [name], you are scheduled for a virtual visit with a [facility] provider today. Just as with appointments in the office, your consent must be obtained to participate. Your consent will be active for this visit and any virtual visit you may have with one of our providers in the next 365 days. If you have a MyChart account, a copy of this consent can be sent to you electronically.  As this is a virtual visit, video technology does not allow for your provider to perform a traditional examination. This may limit your provider's ability to fully assess your condition. If your provider identifies any concerns that need to be evaluated in person or the need to arrange testing (such as labs, EKG, etc.), we will make arrangements to do so. Although advances in technology are sophisticated, we cannot ensure that it will always work on either your end or our end. If the connection with a video visit is poor, the visit may have to be switched to a telephone visit. With either a video or telephone visit, we are not always able to ensure that we have a secure connection.  By engaging in this virtual visit, you consent to the provision of healthcare and authorize for your insurance to be billed (if applicable) for the services provided during this visit. Depending on your insurance coverage, you may receive a charge related to this service.  I need to obtain your verbal consent now. Are you willing to proceed with your visit today? Mary Kramer has provided verbal consent on 03/25/2024 for a virtual visit (video or telephone). Mary Kramer, [place]  Date: 03/25/2024 4:17 PM   Virtual Visit via Video Note   I, Mary Kramer, connected with  Brenae Lasecki  ([id number], Dec 16, 1985) on 03/25/24 at  4:15 PM EDT by a video-enabled telemedicine application and verified that I am speaking with the correct person using two identifiers.  Location: Patient: Virtual Visit  Location Patient: Home Provider: Virtual Visit Location Provider: Home Office   I discussed the limitations of evaluation and management by telemedicine and the availability of in person appointments. The patient expressed understanding and agreed to proceed.    History of Present Illness: Mary Kramer is a 38 y.o. who identifies as a female who was assigned female at birth, and is being seen today for facial tingling with headaches.  HPI: HPI  Problems:  Patient Active Problem List   Diagnosis Date Noted   GAD (generalized anxiety disorder) 10/14/2022   S/P cesarean section 06/17/2021   Delivery by emergency cesarean    History of loop electrosurgical excision procedure (LEEP) of cervix affecting pregnancy, antepartum 12/06/2020   Carrier of genetic disorder 10/31/2020   Asthma, stable, mild intermittent 01/07/2019   Controlled substance agreement signed 04/28/2017   Insomnia 08/10/2015   Vitamin D deficiency 08/10/2015   Migraine without aura 05/11/2015   ADHD (attention deficit hyperactivity disorder) 02/09/2015   Lump or mass in breast 11/15/2012    Allergies:  Allergies  Allergen Reactions   Lexapro [Escitalopram Oxalate] Nausea Only   Hydrocodone Rash   Vicodin [email or web address] ] Rash   Medications:  Current Outpatient Medications:    Albuterol -Budesonide (AIRSUPRA ) 90-80 MCG/ACT AERO, Inhale 2 puffs into the lungs 4 (four) times daily as needed., Disp: 10.7 g, Rfl: 2   amphetamine -dextroamphetamine  (ADDERALL XR) 25 MG 24 hr capsule, Take 1 capsule by mouth every morning., Disp: 90 capsule, Rfl: 0   amphetamine -dextroamphetamine  (ADDERALL) 10 MG tablet,  Take 1 tablet (10 mg total) by mouth daily as needed., Disp: 30 tablet, Rfl: 0   fluticasone  (FLONASE ) 50 MCG/ACT nasal spray, Place 2 sprays into both nostrils daily., Disp: 16 g, Rfl: 0   rizatriptan  (MAXALT ) 10 MG tablet, Take 1 tablet (10 mg total) by mouth as needed for migraine. May repeat in 2 hours  if needed, Disp: 10 tablet, Rfl: 0  Observations/Objective: Patient is well-developed, well-nourished in no acute distress.  Resting comfortably at home.  Head is normocephalic, atraumatic.  No labored breathing.  Speech is clear and coherent with logical content.  Patient is alert and oriented at baseline.    Assessment and Plan: 1. Facial tingling sensation (Primary)  Patient presenting with ongoing facial tingling, denies weakness, and appears normal. States symptoms have become more regular. Based on this, I advised patient to present to ER. Unable to ascertain if this is early bells palsy vs zoster vs migraine, vs other emergent causes of tingling such as stroke. Patient agreed to this plan.  Follow Up Instructions: I discussed the assessment and treatment plan with the patient. The patient was provided an opportunity to ask questions and all were answered. The patient agreed with the plan and demonstrated an understanding of the instructions.  A copy of instructions were sent to the patient via MyChart unless otherwise noted below.    The patient was advised to call back or seek an in-person evaluation if the symptoms worsen or if the condition fails to improve as anticipated.    Mary Shuck, PA-C

## 2024-03-25 NOTE — Patient Instructions (Signed)
  [name], thank you for joining [name], PA-C for today's virtual visit.  While this provider is not your primary care provider (PCP), if your PCP is located in our provider database this encounter information will be shared with them immediately following your visit.   A [facility] MyChart account gives you access to today's visit and all your visits, tests, and labs performed at [facility]  click here if you don't have a [facility] MyChart account or go to [email or web address]  Consent: (Patient) Mary Kramer provided verbal consent for this virtual visit at the beginning of the encounter.  Current Medications:  Current Outpatient Medications:    Albuterol -Budesonide (AIRSUPRA ) 90-80 MCG/ACT AERO, Inhale 2 puffs into the lungs 4 (four) times daily as needed., Disp: 10.7 g, Rfl: 2   amphetamine -dextroamphetamine  (ADDERALL XR) 25 MG 24 hr capsule, Take 1 capsule by mouth every morning., Disp: 90 capsule, Rfl: 0   amphetamine -dextroamphetamine  (ADDERALL) 10 MG tablet, Take 1 tablet (10 mg total) by mouth daily as needed., Disp: 30 tablet, Rfl: 0   fluticasone  (FLONASE ) 50 MCG/ACT nasal spray, Place 2 sprays into both nostrils daily., Disp: 16 g, Rfl: 0   rizatriptan  (MAXALT ) 10 MG tablet, Take 1 tablet (10 mg total) by mouth as needed for migraine. May repeat in 2 hours if needed, Disp: 10 tablet, Rfl: 0   Medications ordered in this encounter:  No orders of the defined types were placed in this encounter.    *If you need refills on other medications prior to your next appointment, please contact your pharmacy*  Follow-Up: Call back or seek an in-person evaluation if the symptoms worsen or if the condition fails to improve as anticipated.  [facility] [phone]  Other Instructions Report to ER for evaluation.    If you have been instructed to have an in-person evaluation today at a local Urgent Care facility, please use  the link below. It will take you to a list of all of our available [facility], including address, phone number and hours of operation. Please do not delay care.  [facility]  If you or a family member do not have a primary care provider, use the link below to schedule a visit and establish care. When you choose a [facility] primary care physician or advanced practice provider, you gain a long-term partner in health. Find a Primary Care Provider  Learn more about [facility] in-office and virtual care options: [facility] - Get Care Now

## 2024-05-10 ENCOUNTER — Ambulatory Visit: Admitting: Family Medicine

## 2024-05-10 ENCOUNTER — Encounter: Payer: Self-pay | Admitting: Family Medicine

## 2024-05-10 VITALS — BP 100/66 | HR 81 | Resp 16 | Ht 61.0 in | Wt 134.1 lb

## 2024-05-10 DIAGNOSIS — R202 Paresthesia of skin: Secondary | ICD-10-CM

## 2024-05-10 DIAGNOSIS — G43009 Migraine without aura, not intractable, without status migrainosus: Secondary | ICD-10-CM | POA: Diagnosis not present

## 2024-05-10 DIAGNOSIS — J4520 Mild intermittent asthma, uncomplicated: Secondary | ICD-10-CM | POA: Diagnosis not present

## 2024-05-10 DIAGNOSIS — F411 Generalized anxiety disorder: Secondary | ICD-10-CM

## 2024-05-10 DIAGNOSIS — E559 Vitamin D deficiency, unspecified: Secondary | ICD-10-CM

## 2024-05-10 DIAGNOSIS — F909 Attention-deficit hyperactivity disorder, unspecified type: Secondary | ICD-10-CM

## 2024-05-10 MED ORDER — NURTEC 75 MG PO TBDP: 1.0000 | ORAL_TABLET | ORAL | 2 refills | Status: AC | PRN

## 2024-05-10 NOTE — Progress Notes (Signed)
 Name: Mary Kramer   MRN: [id number]    DOB: 19-Jul-1985   Date:05/10/2024       Progress Note  Subjective  Chief Complaint  Chief Complaint  Patient presents with   Migraine    Would [facility]   Discussed the use of AI scribe software for clinical note transcription with the patient, who gave verbal consent to proceed.  History of Present Illness Mary Kramer is a 38 year old female with migraines who presents with left-sided facial tingling and numbness.  She has been experiencing daily headaches prior to an ER visit on Halloween. During the week of Halloween, she developed tingling at the tip of her tongue, followed by tingling and numbness on the left side of her face. These symptoms were intermittent throughout the week but worsened towards the end of the week, prompting her to visit the emergency department.  At the emergency department, she was offered a CT scan, but she opted for a migraine cocktail instead, which included Toradol  and other medications. This treatment alleviated her symptoms by the next day. However, the tingling and numbness returned yesterday, affecting the left side of her face, though it is not painful. She describes the sensation as 'tingly numb' and notes that it comes and goes.  She has a history of migraines and uses Maxalt , which sometimes makes her sleepy. She has not experienced these specific symptoms with her migraines before. She also reports feeling 'spaced out' the day before the tingling and numbness began.  No triggers related to cold, chewing, or brushing her teeth. She chews gum regularly without issue. She mentions a recent period of stress due to a lab inspection but denies any significant stress related to the holidays.  Her family history includes aneurysms in her grandfather and aunt, both of whom had ruptures leading to strokes in their early forties. Her mother and her siblings have been tested and are fine.  She  has not changed her contact lenses but notes a difference in her vision in her left eye since the episodes began. She is due for an eye exam. She also mentions having Botox the week before the symptoms started but has had it before without issues.  She is currently taking Adderall but has reduced her use of the long-acting form due to concerns about her symptoms. She has not needed a refill for Maxalt  recently.    Patient Active Problem List   Diagnosis Date Noted   GAD (generalized anxiety disorder) 10/14/2022   S/P cesarean section 06/17/2021   Delivery by emergency cesarean    History of loop electrosurgical excision procedure (LEEP) of cervix affecting pregnancy, antepartum 12/06/2020   Carrier of genetic disorder 10/31/2020   Asthma, stable, mild intermittent 01/07/2019   Controlled substance agreement signed 04/28/2017   Insomnia 08/10/2015   Vitamin D deficiency 08/10/2015   Migraine without aura 05/11/2015   ADHD (attention deficit hyperactivity disorder) 02/09/2015   Lump or mass in breast 11/15/2012    Past Surgical History:  Procedure Laterality Date   CERVICAL BIOPSY  W/ LOOP ELECTRODE EXCISION     CESAREAN SECTION  06/09/2021   Procedure: CESAREAN SECTION;  Surgeon: Lake Read, MD;  Location: [facility] ORS;  Service: Obstetrics;;   DG HYSTEROGRAM (HSG)  07/30/2017        Family History  Problem Relation Age of Onset   Anxiety disorder Mother    Migraines Mother    Hyperlipidemia Father    Transient ischemic attack  Father    Hypertension Father    Stroke Father    Lung disease Father    Atrial fibrillation Maternal Grandmother    Cancer Paternal Grandmother        leukemia   Diabetes Paternal Grandfather    Cerebral aneurysm Paternal Aunt    Cerebral aneurysm Maternal Grandfather     Social History   Tobacco Use   Smoking status: Never   Smokeless tobacco: Never  Substance Use Topics   Alcohol use: Not Currently    Comment: rarely    Current  Medications[1]  Allergies[2]  I personally reviewed active problem list, medication list, allergies, family history with the patient/caregiver today.   ROS  Ten systems reviewed and is negative except as mentioned in HPI    Objective Physical Exam CONSTITUTIONAL: Patient appears well-developed and well-nourished. No distress. HEENT: Head atraumatic, normocephalic, neck supple. CARDIOVASCULAR: Normal rate, regular rhythm and normal heart sounds. No murmur heard. No BLE edema. PULMONARY: Effort normal and breath sounds normal. No respiratory distress. ABDOMINAL: There is no tenderness or distention. MUSCULOSKELETAL: Normal gait. Without gross motor or sensory deficit. PSYCHIATRIC: Patient has a normal mood and affect. Behavior is normal. Judgment and thought content normal. NEUROLOGICAL: Cranial nerves grossly intact. Motor function intact. Sensation symmetric and intact. Proprioception intact. Romberg negative  Vitals:   05/10/24 1358  BP: 100/66  Pulse: 81  Resp: 16  SpO2: 96%  Weight: 134 lb 1.6 oz (60.8 kg)  Height: 5' 1 (1.549 m)    Body mass index is 25.34 kg/m.   PHQ2/9:    05/10/2024    2:02 PM 02/16/2024    7:57 AM 10/26/2023    9:36 AM 06/17/2023    8:10 AM 10/14/2022   11:38 AM  Depression screen PHQ 2/9  Decreased Interest 0 0 0 0 0  Down, Depressed, Hopeless 0 0 0 0 0  PHQ - 2 Score 0 0 0 0 0  Altered sleeping   0 0 1  Tired, decreased energy   0 0 1  Change in appetite   0 0 0  Feeling bad or failure about yourself    0 0 0  Trouble concentrating   0 0 0  Moving slowly or fidgety/restless   0 0 0  Suicidal thoughts   0 0 0  PHQ-9 Score   0  0  2   Difficult doing work/chores   Not difficult at all  Not difficult at all     Data saved with a previous flowsheet row definition    phq 9 is negative  Fall Risk:    05/10/2024    2:02 PM 02/16/2024    7:57 AM 10/26/2023    9:36 AM 10/14/2022   11:33 AM 08/08/2022    2:15 PM  Fall Risk   Falls in the  past year? 0 0 0 0 0  Number falls in past yr: 0 0 0  0  Injury with Fall? 0 0  0   0   Risk for fall due to : No Fall Risks No Fall Risks No Fall Risks No Fall Risks   Follow up Falls evaluation completed Falls evaluation completed Falls prevention discussed;Education provided;Falls evaluation completed Falls prevention discussed      Data saved with a previous flowsheet row definition      Assessment & Plan Migraine with recurrent facial paresthesia Intermittent left facial tingling and numbness with daily headaches. Resolved with migraine cocktail but recurred. Differential includes trigeminal neuralgia, MS,  and aneurysm. Atypical for trigeminal neuralgia due to lack of pain. Family history of aneurysms suggests vascular causes. Imaging needed to rule out serious conditions. - Ordered MRI of brain and cervical spine with and without contrast for January 2nd. - Referred to neurology for further evaluation. - Prescribed Nurtec for migraine prevention. - Provided voucher for Nurtec due to cost. - Continue Maxalt  as needed, prefer Nurtec for prevention.  Attention-deficit hyperactivity disorder Currently managed on Adderall with reduced long-acting dose due to health concerns. No immediate refill needed. - Continue current Adderall regimen as needed.        [1]  Current Outpatient Medications:    Albuterol -Budesonide (AIRSUPRA ) 90-80 MCG/ACT AERO, Inhale 2 puffs into the lungs 4 (four) times daily as needed., Disp: 10.7 g, Rfl: 2   amphetamine -dextroamphetamine  (ADDERALL XR) 25 MG 24 hr capsule, Take 1 capsule by mouth every morning., Disp: 90 capsule, Rfl: 0   amphetamine -dextroamphetamine  (ADDERALL) 10 MG tablet, Take 1 tablet (10 mg total) by mouth daily as needed., Disp: 30 tablet, Rfl: 0   fluticasone  (FLONASE ) 50 MCG/ACT nasal spray, Place 2 sprays into both nostrils daily., Disp: 16 g, Rfl: 0   rizatriptan  (MAXALT ) 10 MG tablet, Take 1 tablet (10 mg total) by mouth as needed  for migraine. May repeat in 2 hours if needed, Disp: 10 tablet, Rfl: 0 [2]  Allergies Allergen Reactions   Lexapro [Escitalopram Oxalate] Nausea Only   Hydrocodone Rash   Vicodin [Hydrocodone-Acetaminophen ] Rash

## 2024-05-16 ENCOUNTER — Encounter: Payer: Self-pay | Admitting: Family Medicine

## 2024-05-16 ENCOUNTER — Other Ambulatory Visit: Payer: Self-pay | Admitting: Family Medicine

## 2024-05-16 DIAGNOSIS — Z91014 Allergy to mammalian meats: Secondary | ICD-10-CM

## 2024-06-01 ENCOUNTER — Encounter: Payer: Self-pay | Admitting: Family Medicine

## 2024-06-01 ENCOUNTER — Other Ambulatory Visit: Payer: Self-pay

## 2024-06-01 ENCOUNTER — Ambulatory Visit (INDEPENDENT_AMBULATORY_CARE_PROVIDER_SITE_OTHER): Admitting: Family Medicine

## 2024-06-01 VITALS — BP 122/78 | HR 74 | Temp 98.0°F | Resp 16 | Ht 61.0 in | Wt 136.2 lb

## 2024-06-01 DIAGNOSIS — F909 Attention-deficit hyperactivity disorder, unspecified type: Secondary | ICD-10-CM | POA: Diagnosis not present

## 2024-06-01 DIAGNOSIS — G43009 Migraine without aura, not intractable, without status migrainosus: Secondary | ICD-10-CM | POA: Diagnosis not present

## 2024-06-01 DIAGNOSIS — J4520 Mild intermittent asthma, uncomplicated: Secondary | ICD-10-CM

## 2024-06-01 MED ORDER — AMPHETAMINE-DEXTROAMPHET ER 25 MG PO CP24
25.0000 mg | ORAL_CAPSULE | ORAL | 0 refills | Status: AC
  Filled 2024-06-01: qty 30, 30d supply, fill #0
  Filled 2024-06-23: qty 90, 90d supply, fill #0

## 2024-06-01 MED ORDER — AMPHETAMINE-DEXTROAMPHETAMINE 10 MG PO TABS
10.0000 mg | ORAL_TABLET | Freq: Every day | ORAL | 0 refills | Status: AC | PRN
  Filled 2024-06-01 – 2024-06-23 (×2): qty 30, 30d supply, fill #0

## 2024-06-01 NOTE — Progress Notes (Signed)
 Name: Mary Kramer   MRN: [id number]    DOB: Apr 02, 1986   Date:06/01/2024       Progress Note  Subjective  Chief Complaint  Chief Complaint  Patient presents with   Medical Management of Chronic Issues   Discussed the use of AI scribe software for clinical note transcription with the patient, who gave verbal consent to proceed.  History of Present Illness Mary Kramer is a 39 year old female who presents for follow-up after an emergency room visit for migraines.  Since her last visit in December, she has been free of migraines, attributing this improvement to eliminating red meat from her diet. She has not experienced any headaches or symptoms since making this dietary change. She considered testing for alpha-gal syndrome but decided against it after her symptoms resolved with dietary changes.  She is currently using Maxalt  as needed for migraines but has not needed it recently due to the absence of headaches. She was previously prescribed Nurtec but has not yet obtained it. Reminded her Nurtec usually causes less side effects   She manages her ADD with Adderall XR 25 mg daily, occasionally taking an additional dose in the afternoon. She does not take it on weekends to extend her medication supply. She reports no side effects such as tics or increased blood pressure and states it does not affect her sleep. She is considering switching to a specific generic brand that she felt worked better for her in the past.  She received a flu shot two weeks before Christmas. She reports that she experiences asthma symptoms when she has a cold. She was treated with azithromycin  during a telehealth visit for chest pain and flu-like symptoms last year. She has not received the pneumonia vaccine but reports having an Publishing Rights Manager at home.  She has completed the HPV vaccine series and has a positive titer for hepatitis B, indicating immunity. She recalls receiving the HPV vaccines at various locations,  including the [facility] and a family practice.    Patient Active Problem List   Diagnosis Date Noted   GAD (generalized anxiety disorder) 10/14/2022   S/P cesarean section 06/17/2021   Delivery by emergency cesarean    History of loop electrosurgical excision procedure (LEEP) of cervix affecting pregnancy, antepartum 12/06/2020   Carrier of genetic disorder 10/31/2020   Asthma, stable, mild intermittent 01/07/2019   Controlled substance agreement signed 04/28/2017   Insomnia 08/10/2015   Vitamin D deficiency 08/10/2015   Migraine without aura 05/11/2015   ADHD (attention deficit hyperactivity disorder) 02/09/2015   Lump or mass in breast 11/15/2012    Past Surgical History:  Procedure Laterality Date   CERVICAL BIOPSY  W/ LOOP ELECTRODE EXCISION     CESAREAN SECTION  06/09/2021   Procedure: CESAREAN SECTION;  Surgeon: Lake Read, MD;  Location: [facility] ORS;  Service: Obstetrics;;   DG HYSTEROGRAM (HSG)  07/30/2017        Family History  Problem Relation Age of Onset   Anxiety disorder Mother    Migraines Mother    Hyperlipidemia Father    Transient ischemic attack Father    Hypertension Father    Stroke Father    Lung disease Father    Atrial fibrillation Maternal Grandmother    Cancer Paternal Grandmother        leukemia   Diabetes Paternal Grandfather    Cerebral aneurysm Paternal Aunt    Cerebral aneurysm Maternal Grandfather     Social History   Tobacco Use  Smoking status: Never   Smokeless tobacco: Never  Substance Use Topics   Alcohol use: Not Currently    Comment: rarely    Current Medications[1]  Allergies[2]  I personally reviewed active problem list, medication list, allergies, family history with the patient/caregiver today.   ROS  Ten systems reviewed and is negative except as mentioned in HPI    Objective Physical Exam CONSTITUTIONAL: Patient appears well-developed and well-nourished.  No distress. HEENT: Head  atraumatic, normocephalic, neck supple. CARDIOVASCULAR: Normal rate, regular rhythm and normal heart sounds.  No murmur heard. No BLE edema. PULMONARY: Effort normal and breath sounds normal. No respiratory distress. ABDOMINAL: There is no tenderness or distention. MUSCULOSKELETAL: Normal gait. Without gross motor or sensory deficit. PSYCHIATRIC: Patient has a normal mood and affect. behavior is normal. Judgment and thought content normal.  Vitals:   06/01/24 0940  BP: 122/78  Pulse: 74  Resp: 16  Temp: 98 F (36.7 C)  SpO2: 98%  Weight: 136 lb 3.2 oz (61.8 kg)  Height: 5' 1 (1.549 m)    Body mass index is 25.73 kg/m.    PHQ2/9:    06/01/2024    9:48 AM 06/01/2024    9:39 AM 05/10/2024    2:02 PM 02/16/2024    7:57 AM 10/26/2023    9:36 AM  Depression screen PHQ 2/9  Decreased Interest 0 0 0 0 0  Down, Depressed, Hopeless 0 0 0 0 0  PHQ - 2 Score 0 0 0 0 0  Altered sleeping 0    0  Tired, decreased energy 0    0  Change in appetite 0    0  Feeling bad or failure about yourself  0    0  Trouble concentrating 0    0  Moving slowly or fidgety/restless 0    0  Suicidal thoughts 0    0  PHQ-9 Score 0    0   Difficult doing work/chores Not difficult at all    Not difficult at all     Data saved with a previous flowsheet row definition    phq 9 is negative  Fall Risk:    06/01/2024    9:39 AM 05/10/2024    2:02 PM 02/16/2024    7:57 AM 10/26/2023    9:36 AM 10/14/2022   11:33 AM  Fall Risk   Falls in the past year? 0 0 0 0 0  Number falls in past yr: 0 0 0 0   Injury with Fall? 0 0 0  0    Risk for fall due to : No Fall Risks No Fall Risks No Fall Risks No Fall Risks No Fall Risks  Follow up Falls evaluation completed Falls evaluation completed Falls evaluation completed Falls prevention discussed;Education provided;Falls evaluation completed Falls prevention discussed     Data saved with a previous flowsheet row definition     Assessment & Plan Migraine without  aura No recent migraines since dietary change. Maxalt  ineffective. Nurtec prescribed but not yet obtained. Stress identified as a trigger. - Obtain Nurtec with voucher for potential zero copay. - Use Nurtec for next migraine episode. - Consider Nurtec every other day for prevention if migraines are frequent.  Attention-deficit hyperactivity disorder Well-managed on Adderall 25 mg with no side effects. Prefers generic version. - Continue Adderall 25 mg as prescribed. - Discuss with pharmacist about preferred generic manufacturer for Adderall XR.  Mild intermittent asthma Asthma flares only with colds. No recent exacerbations. 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 - Continue using [employer] as needed. - Consider telehealth consultation for respiratory symptoms to guide antibiotic use.  General Health Maintenance Flu shot received. Pneumonia vaccine not administered due to mild intermittent asthma. HPV vaccination series completed. Hepatitis B immunity confirmed. - Documented flu shot in medical record. - Confirmed HPV vaccination series completion. - Confirmed hepatitis B immunity status.        [1]  Current Outpatient Medications:    Albuterol -Budesonide (AIRSUPRA ) 90-80 MCG/ACT AERO, Inhale 2 puffs into the lungs 4 (four) times daily as needed., Disp: 10.7 g, Rfl: 2   fluticasone  (FLONASE ) 50 MCG/ACT nasal spray, Place 2 sprays into both nostrils daily., Disp: 16 g, Rfl: 0   rizatriptan  (MAXALT ) 10 MG tablet, Take 1 tablet (10 mg total) by mouth as needed for migraine. May repeat in 2 hours if needed, Disp: 10 tablet, Rfl: 0   amphetamine -dextroamphetamine  (ADDERALL XR) 25 MG 24 hr capsule, Take 1 capsule by mouth every morning., Disp: 90 capsule, Rfl: 0   amphetamine -dextroamphetamine  (ADDERALL) 10 MG tablet, Take 1 tablet (10 mg total) by mouth daily as needed., Disp: 30 tablet, Rfl: 0   Rimegepant Sulfate (NURTEC) 75 MG TBDP, Take 1 tablet (75 mg total) by mouth every other day as needed.  (Patient not taking: Reported on 06/01/2024), Disp: 16 tablet, Rfl: 2 [2]  Allergies Allergen Reactions   Lexapro [Escitalopram Oxalate] Nausea Only   Hydrocodone Rash   Vicodin [Hydrocodone-Acetaminophen ] Rash

## 2024-06-13 ENCOUNTER — Other Ambulatory Visit: Payer: Self-pay

## 2024-06-23 ENCOUNTER — Other Ambulatory Visit: Payer: Self-pay
